# Patient Record
Sex: Female | Born: 1955 | Race: White | Hispanic: No | Marital: Married | Smoking: Former smoker
Health system: Southern US, Community
[De-identification: ages and names within clinical notes are randomized; demographics above are authoritative.]

## PROBLEM LIST (undated history)

## (undated) DIAGNOSIS — F329 Major depressive disorder, single episode, unspecified: Secondary | ICD-10-CM

## (undated) DIAGNOSIS — E039 Hypothyroidism, unspecified: Secondary | ICD-10-CM

## (undated) DIAGNOSIS — C801 Malignant (primary) neoplasm, unspecified: Secondary | ICD-10-CM

## (undated) DIAGNOSIS — E559 Vitamin D deficiency, unspecified: Secondary | ICD-10-CM

## (undated) DIAGNOSIS — E1165 Type 2 diabetes mellitus with hyperglycemia: Secondary | ICD-10-CM

## (undated) DIAGNOSIS — K219 Gastro-esophageal reflux disease without esophagitis: Secondary | ICD-10-CM

## (undated) DIAGNOSIS — F32A Depression, unspecified: Secondary | ICD-10-CM

## (undated) DIAGNOSIS — M1990 Unspecified osteoarthritis, unspecified site: Secondary | ICD-10-CM

## (undated) DIAGNOSIS — E119 Type 2 diabetes mellitus without complications: Secondary | ICD-10-CM

## (undated) DIAGNOSIS — F419 Anxiety disorder, unspecified: Secondary | ICD-10-CM

## (undated) DIAGNOSIS — G4730 Sleep apnea, unspecified: Secondary | ICD-10-CM

## (undated) DIAGNOSIS — E785 Hyperlipidemia, unspecified: Secondary | ICD-10-CM

## (undated) DIAGNOSIS — G2581 Restless legs syndrome: Secondary | ICD-10-CM

## (undated) DIAGNOSIS — D649 Anemia, unspecified: Secondary | ICD-10-CM

## (undated) DIAGNOSIS — B029 Zoster without complications: Secondary | ICD-10-CM

## (undated) HISTORY — PX: CLOSED REDUCTION ANKLE FRACTURE: SUR210

## (undated) HISTORY — PX: BACK SURGERY: SHX140

## (undated) HISTORY — PX: SKIN CANCER EXCISION: SHX779

---
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
## 2008-06-14 HISTORY — PX: BREAST BIOPSY: SHX20

## 2008-06-14 HISTORY — PX: KNEE ARTHROSCOPY: SUR90

## 2009-06-14 HISTORY — PX: CHOLECYSTECTOMY: SHX55

## 2012-06-14 HISTORY — PX: CLOSED REDUCTION HUMERUS FRACTURE: SHX985

## 2014-06-14 HISTORY — PX: EYE SURGERY: SHX253

## 2015-10-22 ENCOUNTER — Other Ambulatory Visit (HOSPITAL_COMMUNITY): Payer: Self-pay | Admitting: Internal Medicine

## 2015-10-22 DIAGNOSIS — Z1231 Encounter for screening mammogram for malignant neoplasm of breast: Secondary | ICD-10-CM

## 2015-11-11 ENCOUNTER — Ambulatory Visit (HOSPITAL_COMMUNITY): Payer: Self-pay

## 2015-11-12 ENCOUNTER — Ambulatory Visit (HOSPITAL_COMMUNITY): Payer: Self-pay

## 2015-11-12 ENCOUNTER — Other Ambulatory Visit (HOSPITAL_COMMUNITY): Payer: Self-pay | Admitting: Internal Medicine

## 2015-11-12 ENCOUNTER — Ambulatory Visit (HOSPITAL_COMMUNITY)
Admission: RE | Admit: 2015-11-12 | Discharge: 2015-11-12 | Disposition: A | Discharge status: Home / Self Care | Payer: Managed Care, Other (non HMO) | Source: Ambulatory Visit | Attending: Internal Medicine | Admitting: Internal Medicine

## 2015-11-12 DIAGNOSIS — Z1231 Encounter for screening mammogram for malignant neoplasm of breast: Secondary | ICD-10-CM | POA: Diagnosis not present

## 2015-12-30 ENCOUNTER — Other Ambulatory Visit (HOSPITAL_COMMUNITY): Payer: Self-pay | Admitting: Internal Medicine

## 2015-12-30 DIAGNOSIS — M545 Low back pain, unspecified: Secondary | ICD-10-CM

## 2016-01-06 ENCOUNTER — Ambulatory Visit (HOSPITAL_COMMUNITY): Payer: Managed Care, Other (non HMO)

## 2016-11-09 ENCOUNTER — Other Ambulatory Visit (HOSPITAL_COMMUNITY): Payer: Self-pay | Admitting: Internal Medicine

## 2016-11-09 DIAGNOSIS — Z1231 Encounter for screening mammogram for malignant neoplasm of breast: Secondary | ICD-10-CM

## 2016-11-17 ENCOUNTER — Ambulatory Visit (HOSPITAL_COMMUNITY)
Admission: RE | Admit: 2016-11-17 | Discharge: 2016-11-17 | Disposition: A | Discharge status: Home / Self Care | Payer: Managed Care, Other (non HMO) | Source: Ambulatory Visit | Attending: Internal Medicine | Admitting: Internal Medicine

## 2016-11-17 DIAGNOSIS — Z1231 Encounter for screening mammogram for malignant neoplasm of breast: Secondary | ICD-10-CM | POA: Insufficient documentation

## 2017-01-21 ENCOUNTER — Encounter (HOSPITAL_COMMUNITY): Payer: Self-pay

## 2017-01-21 ENCOUNTER — Other Ambulatory Visit (HOSPITAL_COMMUNITY): Payer: Self-pay | Admitting: Emergency Medicine

## 2017-01-21 NOTE — Patient Instructions (Signed)
Megan Church  01/21/2017   Your procedure is scheduled on: 01-31-17  Report to [facility] Main  Entrance   Report to admitting at [facility]   Call this number if you have problems the morning of surgery [phone]   Remember: ONLY 1 PERSON MAY GO WITH YOU TO SHORT STAY TO GET  READY MORNING OF YOUR SURGERY.  Do not eat food or drink liquids :After Midnight.     Take these medicines the morning of surgery with A SIP OF WATER: levothyroxine, omeprazole(prilosec)  DO NOT TAKE ANY DIABETIC MEDICATIONS DAY OF YOUR SURGERY                               You may not have any metal on your body including hair pins and              piercings  Do not wear jewelry, make-up, lotions, powders or perfumes, deodorant             Do not wear nail polish.  Do not shave  48 hours prior to surgery.               Do not bring valuables to the hospital. [place].  Contacts, dentures or bridgework may not be worn into surgery.  Leave suitcase in the car. After surgery it may be brought to your room.               Please read over the following fact sheets you were given: _____________________________________________________________________ How to Manage Your Diabetes Before and After Surgery  Why is it important to control my blood sugar before and after surgery? . Improving blood sugar levels before and after surgery helps healing and can limit problems. . A way of improving blood sugar control is eating a healthy diet by: o  Eating less sugar and carbohydrates o  Increasing activity/exercise o  Talking with your doctor about reaching your blood sugar goals . High blood sugars (greater than 180 mg/dL) can raise your risk of infections and slow your recovery, so you will need to focus on controlling your diabetes during the weeks before surgery. . Make sure that the doctor who takes care of your diabetes knows about your  planned surgery including the date and location.  How do I manage my blood sugar before surgery? . Check your blood sugar at least 4 times a day, starting 2 days before surgery, to make sure that the level is not too high or low. o Check your blood sugar the morning of your surgery when you wake up and every 2 hours until you get to the Short Stay unit. . If your blood sugar is less than 70 mg/dL, you will need to treat for low blood sugar: o Do not take insulin. o Treat a low blood sugar (less than 70 mg/dL) with  cup of clear juice (cranberry or apple), 4 glucose tablets, OR glucose gel. o Recheck blood sugar in 15 minutes after treatment (to make sure it is greater than 70 mg/dL). If your blood sugar is not greater than 70 mg/dL on recheck, call [phone] for further instructions. . Report your blood sugar to the short stay nurse when you get to Short Stay.  Marland Kitchen  If you are admitted to the hospital after surgery: o Your blood sugar will be checked by the staff and you will probably be given insulin after surgery (instead of oral diabetes medicines) to make sure you have good blood sugar levels. o The goal for blood sugar control after surgery is 80-180 mg/dL.   WHAT DO I DO ABOUT MY DIABETES MEDICATION?   . THE DAY BEFORE SURGERY,   take  30 units   units of   LEVEMIR    Insulin at bedtime   Take Metformin as usual    Take VICTOZA as usual      . THE MORNING OF SURGERY,   DO NOT TAKE METFORMIN  DO NOT TAKE VICTOZA   Patient Signature:  Date:   Nurse Signature:  Date:   Reviewed and Endorsed by [place] Patient Education Committee, August 2015            [facility] - Preparing for Surgery Before surgery, you can play an important role.  Because skin is not sterile, your skin needs to be as free of germs as possible.  You can reduce the number of germs on your skin by washing with CHG (chlorahexidine gluconate) soap before surgery.  CHG is an antiseptic cleaner which  kills germs and bonds with the skin to continue killing germs even after washing. Please DO NOT use if you have an allergy to CHG or antibacterial soaps.  If your skin becomes reddened/irritated stop using the CHG and inform your nurse when you arrive at Short Stay. Do not shave (including legs and underarms) for at least 48 hours prior to the first CHG shower.  You may shave your face/neck. Please follow these instructions carefully:  1.  Shower with CHG Soap the night before surgery and the  morning of Surgery.  2.  If you choose to wash your hair, wash your hair first as usual with your  normal  shampoo.  3.  After you shampoo, rinse your hair and body thoroughly to remove the  shampoo.                           4.  Use CHG as you would any other liquid soap.  You can apply chg directly  to the skin and wash                       Gently with a scrungie or clean washcloth.  5.  Apply the CHG Soap to your body ONLY FROM THE NECK DOWN.   Do not use on face/ open                           Wound or open sores. Avoid contact with eyes, ears mouth and genitals (private parts).                       Wash face,  Genitals (private parts) with your normal soap.             6.  Wash thoroughly, paying special attention to the area where your surgery  will be performed.  7.  Thoroughly rinse your body with warm water from the neck down.  8.  DO NOT shower/wash with your normal soap after using and rinsing off  the CHG Soap.  9.  Pat yourself dry with a clean towel.            10.  Wear clean pajamas.            11.  Place clean sheets on your bed the night of your first shower and do not  sleep with pets. Day of Surgery : Do not apply any lotions/deodorants the morning of surgery.  Please wear clean clothes to the hospital/surgery center.  FAILURE TO FOLLOW THESE INSTRUCTIONS MAY RESULT IN THE CANCELLATION OF YOUR SURGERY PATIENT SIGNATURE_________________________________  NURSE  SIGNATURE__________________________________  ________________________________________________________________________   Adam Phenix  An incentive spirometer is a tool that can help keep your lungs clear and active. This tool measures how well you are filling your lungs with each breath. Taking long deep breaths may help reverse or decrease the chance of developing breathing (pulmonary) problems (especially infection) following:  A long period of time when you are unable to move or be active. BEFORE THE PROCEDURE   If the spirometer includes an indicator to show your best effort, your nurse or respiratory therapist will set it to a desired goal.  If possible, sit up straight or lean slightly forward. Try not to slouch.  Hold the incentive spirometer in an upright position. INSTRUCTIONS FOR USE  1. Sit on the edge of your bed if possible, or sit up as far as you can in bed or on a chair. 2. Hold the incentive spirometer in an upright position. 3. Breathe out normally. 4. Place the mouthpiece in your mouth and seal your lips tightly around it. 5. Breathe in slowly and as deeply as possible, raising the piston or the ball toward the top of the column. 6. Hold your breath for 3-5 seconds or for as long as possible. Allow the piston or ball to fall to the bottom of the column. 7. Remove the mouthpiece from your mouth and breathe out normally. 8. Rest for a few seconds and repeat Steps 1 through 7 at least 10 times every 1-2 hours when you are awake. Take your time and take a few normal breaths between deep breaths. 9. The spirometer may include an indicator to show your best effort. Use the indicator as a goal to work toward during each repetition. 10. After each set of 10 deep breaths, practice coughing to be sure your lungs are clear. If you have an incision (the cut made at the time of surgery), support your incision when coughing by placing a pillow or rolled up towels firmly  against it. Once you are able to get out of bed, walk around indoors and cough well. You may stop using the incentive spirometer when instructed by your caregiver.  RISKS AND COMPLICATIONS  Take your time so you do not get dizzy or light-headed.  If you are in pain, you may need to take or ask for pain medication before doing incentive spirometry. It is harder to take a deep breath if you are having pain. AFTER USE  Rest and breathe slowly and easily.  It can be helpful to keep track of a log of your progress. Your caregiver can provide you with a simple table to help with this. If you are using the spirometer at home, follow these instructions: [place] IF:   You are having difficultly using the spirometer.  You have trouble using the spirometer as often as instructed.  Your pain medication is not giving enough relief while using the spirometer.  You  develop fever of 100.5 F (38.1 C) or higher. SEEK IMMEDIATE MEDICAL CARE IF:   You cough up bloody sputum that had not been present before.  You develop fever of 102 F (38.9 C) or greater.  You develop worsening pain at or near the incision site. MAKE SURE YOU:   Understand these instructions.  Will watch your condition.  Will get help right away if you are not doing well or get worse. Document Released: 10/11/2006 Document Revised: 08/23/2011 Document Reviewed: 12/12/2006 Exit[employer] Patient Information 2014 Exit[employer], [place].   ________________________________________________________________________  WHAT IS A BLOOD TRANSFUSION? Blood Transfusion Information  A transfusion is the replacement of blood or some of its parts. Blood is made up of multiple cells which provide different functions.  Red blood cells carry oxygen and are used for blood loss replacement.  White blood cells fight against infection.  Platelets control bleeding.  Plasma helps clot blood.  Other blood products are available for  specialized needs, such as hemophilia or other clotting disorders. BEFORE THE TRANSFUSION  Who gives blood for transfusions?   Healthy volunteers who are fully evaluated to make sure their blood is safe. This is blood bank blood. Transfusion therapy is the safest it has ever been in the practice of medicine. Before blood is taken from a donor, a complete history is taken to make sure that person has no history of diseases nor engages in risky social behavior (examples are intravenous drug use or sexual activity with multiple partners). The donor's travel history is screened to minimize risk of transmitting infections, such as malaria. The donated blood is tested for signs of infectious diseases, such as HIV and hepatitis. The blood is then tested to be sure it is compatible with you in order to minimize the chance of a transfusion reaction. If you or a relative donates blood, this is often done in anticipation of surgery and is not appropriate for emergency situations. It takes many days to process the donated blood. RISKS AND COMPLICATIONS Although transfusion therapy is very safe and saves many lives, the main dangers of transfusion include:   Getting an infectious disease.  Developing a transfusion reaction. This is an allergic reaction to something in the blood you were given. Every precaution is taken to prevent this. The decision to have a blood transfusion has been considered carefully by your caregiver before blood is given. Blood is not given unless the benefits outweigh the risks. AFTER THE TRANSFUSION  Right after receiving a blood transfusion, you will usually feel much better and more energetic. This is especially true if your red blood cells have gotten low (anemic). The transfusion raises the level of the red blood cells which carry oxygen, and this usually causes an energy increase.  The nurse administering the transfusion will monitor you carefully for complications. HOME CARE  INSTRUCTIONS  No special instructions are needed after a transfusion. You may find your energy is better. Speak with your caregiver about any limitations on activity for underlying diseases you may have. SEEK MEDICAL CARE IF:   Your condition is not improving after your transfusion.  You develop redness or irritation at the intravenous (IV) site. SEEK IMMEDIATE MEDICAL CARE IF:  Any of the following symptoms occur over the next 12 hours:  Shaking chills.  You have a temperature by mouth above 102 F (38.9 C), not controlled by medicine.  Chest, back, or muscle pain.  People around you feel you are not acting correctly or are confused.  Shortness of  breath or difficulty breathing.  Dizziness and fainting.  You get a rash or develop hives.  You have a decrease in urine output.  Your urine turns a dark color or changes to pink, red, or brown. Any of the following symptoms occur over the next 10 days:  You have a temperature by mouth above 102 F (38.9 C), not controlled by medicine.  Shortness of breath.  Weakness after normal activity.  The white part of the eye turns yellow (jaundice).  You have a decrease in the amount of urine or are urinating less often.  Your urine turns a dark color or changes to pink, red, or brown. Document Released: 05/28/2000 Document Revised: 08/23/2011 Document Reviewed: 01/15/2008 [facility] Patient Information 2014 [place], [place].  _______________________________________________________________________

## 2017-01-21 NOTE — Progress Notes (Signed)
LOV/ surgical clearance Dr Anastasio Champion 12-27-16 on chart   EKG 08-2016 on chart   HgA1c 12-27-16 on chart

## 2017-01-24 ENCOUNTER — Encounter (HOSPITAL_COMMUNITY): Payer: Self-pay

## 2017-01-24 ENCOUNTER — Encounter (HOSPITAL_COMMUNITY)
Admission: RE | Admit: 2017-01-24 | Discharge: 2017-01-24 | Disposition: A | Discharge status: Home / Self Care | Payer: Managed Care, Other (non HMO) | Source: Ambulatory Visit | Attending: Orthopedic Surgery | Admitting: Orthopedic Surgery

## 2017-01-24 DIAGNOSIS — Z01812 Encounter for preprocedural laboratory examination: Secondary | ICD-10-CM | POA: Diagnosis not present

## 2017-01-24 DIAGNOSIS — M1711 Unilateral primary osteoarthritis, right knee: Secondary | ICD-10-CM | POA: Diagnosis not present

## 2017-01-24 HISTORY — DX: Depression, unspecified: F32.A

## 2017-01-24 HISTORY — DX: Hyperlipidemia, unspecified: E78.5

## 2017-01-24 HISTORY — DX: Gastro-esophageal reflux disease without esophagitis: K21.9

## 2017-01-24 HISTORY — DX: Restless legs syndrome: G25.81

## 2017-01-24 HISTORY — DX: Hypothyroidism, unspecified: E03.9

## 2017-01-24 HISTORY — DX: Type 2 diabetes mellitus without complications: E11.9

## 2017-01-24 HISTORY — DX: Major depressive disorder, single episode, unspecified: F32.9

## 2017-01-24 HISTORY — DX: Zoster without complications: B02.9

## 2017-01-24 HISTORY — DX: Unspecified osteoarthritis, unspecified site: M19.90

## 2017-01-24 LAB — CBC
HCT: 33.5 % — ABNORMAL LOW (ref 36.0–46.0)
Hemoglobin: 11.4 g/dL — ABNORMAL LOW (ref 12.0–15.0)
MCH: 31.6 pg (ref 26.0–34.0)
MCHC: 34 g/dL (ref 30.0–36.0)
MCV: 92.8 fL (ref 78.0–100.0)
Platelets: 279 10*3/uL (ref 150–400)
RBC: 3.61 MIL/uL — ABNORMAL LOW (ref 3.87–5.11)
RDW: 15.5 % (ref 11.5–15.5)
WBC: 7.3 10*3/uL (ref 4.0–10.5)

## 2017-01-24 LAB — BASIC METABOLIC PANEL
Anion gap: 10 (ref 5–15)
BUN: 11 mg/dL (ref 6–20)
CO2: 27 mmol/L (ref 22–32)
Calcium: 9.5 mg/dL (ref 8.9–10.3)
Chloride: 104 mmol/L (ref 101–111)
Creatinine, Ser: 0.64 mg/dL (ref 0.44–1.00)
GFR calc Af Amer: >60 mL/min (ref >60)
GFR calc non Af Amer: >60 mL/min (ref >60)
Glucose, Bld: 158 mg/dL — ABNORMAL HIGH (ref 65–99)
Potassium: 3.6 mmol/L (ref 3.5–5.1)
Sodium: 141 mmol/L (ref 135–145)

## 2017-01-24 LAB — GLUCOSE, CAPILLARY: Glucose-Capillary: 183 mg/dL — ABNORMAL HIGH (ref 65–99)

## 2017-01-24 LAB — SURGICAL PCR SCREEN
MRSA, PCR: NEGATIVE
Staphylococcus aureus: NEGATIVE

## 2017-01-24 LAB — ABO/RH: ABO/RH(D): O POS

## 2017-01-25 LAB — HEMOGLOBIN A1C
Hgb A1c MFr Bld: 7.9 % — ABNORMAL HIGH (ref 4.8–5.6)
Mean Plasma Glucose: 180 mg/dL

## 2017-01-26 NOTE — H&P (Signed)
TOTAL KNEE ADMISSION H&P  Patient is being admitted for right total knee arthroplasty.  Subjective:  Chief Complaint:   Right knee primary OA / pain  HPI: Megan Church, 61 y.o. female, has a history of pain and functional disability in the right knee due to arthritis and has failed non-surgical conservative treatments for greater than 12 weeks to include NSAID's and/or analgesics, corticosteriod injections, viscosupplementation injections and activity modification.  Onset of symptoms was gradual, starting 3+ years ago with gradually worsening course since that time. The patient noted prior procedures on the knee to include  arthroscopy and menisectomy on the right knee(s).  Patient currently rates pain in the right knee(s) at 10 out of 10 with activity. Patient has night pain, worsening of pain with activity and weight bearing, pain that interferes with activities of daily living, pain with passive range of motion, crepitus and joint swelling.  Patient has evidence of periarticular osteophytes and joint space narrowing by imaging studies. There is no active infection.  Risks, benefits and expectations were discussed with the patient.  Risks including but not limited to the risk of anesthesia, blood clots, nerve damage, blood vessel damage, failure of the prosthesis, infection and up to and including death.  Patient understand the risks, benefits and expectations and wishes to proceed with surgery.   PCP: Megan Albee, MD  D/C Plans:       Home  Post-op Meds:       No Rx given  Tranexamic Acid:      To be given - IV    Decadron:      Is to be given  FYI:     ASA  Norco  CPAP  DME:   Pt already has equipment   PT:   Rx given for OPPT   Past Medical History:  Diagnosis Date  . Arthritis   . Depression   . Diabetes mellitus without complication ([place])    2  . GERD (gastroesophageal reflux disease)   . HLD (hyperlipidemia)   . Hypothyroidism   . RLS (restless legs syndrome)    . Shingles     Past Surgical History:  Procedure Laterality Date  . BACK SURGERY  2012, 2016   lumbar fusion 2012, cleaning out of area 2016  . BREAST BIOPSY Left 2010  . CHOLECYSTECTOMY  2011  . CLOSED REDUCTION ANKLE FRACTURE    . CLOSED REDUCTION HUMERUS FRACTURE Right 2014  . EYE SURGERY  2016   eye lid droop   . KNEE ARTHROSCOPY Right 2010    No prescriptions prior to admission.   Allergies  Allergen Reactions  . Diclofenac Sodium Rash  . Sulfa Antibiotics Hives  . Ace Inhibitors Cough  . Invokana [Canagliflozin] Other (See Comments)    Yeast   . Vytorin [Ezetimibe-Simvastatin] Other (See Comments)    Leg cramps  . Atorvastatin Hives    Social History  Substance Use Topics  . Smoking status: Former Research scientist (life sciences)  . Smokeless tobacco: Never Used     Comment: quit age 41  . Alcohol use 4.2 oz/week    7 Shots of liquor per week     Comment: mixed drink 1 a day        Review of Systems  Constitutional: Negative.   HENT: Negative.   Eyes: Negative.   Respiratory: Positive for shortness of breath (on exertion).   Cardiovascular: Negative.   Gastrointestinal: Positive for constipation, diarrhea and heartburn.  Genitourinary: Negative.   Musculoskeletal: Positive for back pain  and joint pain.  Skin: Negative.   Neurological: Negative.   Endo/Heme/Allergies: Negative.   Psychiatric/Behavioral: The patient has insomnia.     Objective:  Physical Exam  Constitutional: She is oriented to person, place, and time. She appears well-developed.  HENT:  Head: Normocephalic.  Eyes: Pupils are equal, round, and reactive to light.  Neck: Neck supple. No JVD present. No tracheal deviation present. No thyromegaly present.  Cardiovascular: Normal rate, regular rhythm and intact distal pulses.   Respiratory: Effort normal and breath sounds normal. No respiratory distress. She has no wheezes.  GI: Soft. There is no tenderness. There is no guarding.  Musculoskeletal:       Right  knee: She exhibits decreased range of motion, swelling and bony tenderness. She exhibits no ecchymosis, no deformity, no laceration and no erythema. Tenderness found.  Lymphadenopathy:    She has no cervical adenopathy.  Neurological: She is alert and oriented to person, place, and time.  Skin: Skin is warm and dry.  Psychiatric: She has a normal mood and affect.      Labs:  Estimated body mass index is 36.6 kg/m as calculated from the following:   Height as of 01/24/17: 5\' 4"  (1.626 m).   Weight as of 01/24/17: 96.7 kg (213 lb 3.2 oz).   Imaging Review Plain radiographs demonstrate severe degenerative joint disease of the right knee(s). The overall alignment is neutral. The bone quality appears to be good for age and reported activity level.  Assessment/Plan:  End stage arthritis, right knee   The patient history, physical examination, clinical judgment of the provider and imaging studies are consistent with end stage degenerative joint disease of the right knee(s) and total knee arthroplasty is deemed medically necessary. The treatment options including medical management, injection therapy arthroscopy and arthroplasty were discussed at length. The risks and benefits of total knee arthroplasty were presented and reviewed. The risks due to aseptic loosening, infection, stiffness, patella tracking problems, thromboembolic complications and other imponderables were discussed. The patient acknowledged the explanation, agreed to proceed with the plan and consent was signed. Patient is being admitted for inpatient treatment for surgery, pain control, PT, OT, prophylactic antibiotics, VTE prophylaxis, progressive ambulation and ADL's and discharge planning. The patient is planning to be discharged home.    Megan Church Megan Yabut   PA-C  01/26/2017, 10:57 AM

## 2017-01-31 ENCOUNTER — Observation Stay (HOSPITAL_COMMUNITY)
Admission: RE | Admit: 2017-01-31 | Discharge: 2017-02-01 | Disposition: A | Discharge status: Home / Self Care | Payer: Managed Care, Other (non HMO) | Source: Ambulatory Visit | Attending: Orthopedic Surgery | Admitting: Orthopedic Surgery

## 2017-01-31 ENCOUNTER — Inpatient Hospital Stay (HOSPITAL_COMMUNITY): Payer: Managed Care, Other (non HMO) | Admitting: Anesthesiology

## 2017-01-31 ENCOUNTER — Encounter (HOSPITAL_COMMUNITY): Payer: Self-pay

## 2017-01-31 ENCOUNTER — Encounter (HOSPITAL_COMMUNITY)
Admission: RE | Disposition: A | Discharge status: Home / Self Care | Payer: Self-pay | Source: Ambulatory Visit | Attending: Orthopedic Surgery

## 2017-01-31 DIAGNOSIS — F329 Major depressive disorder, single episode, unspecified: Secondary | ICD-10-CM | POA: Diagnosis not present

## 2017-01-31 DIAGNOSIS — E785 Hyperlipidemia, unspecified: Secondary | ICD-10-CM | POA: Insufficient documentation

## 2017-01-31 DIAGNOSIS — Z6836 Body mass index (BMI) 36.0-36.9, adult: Secondary | ICD-10-CM | POA: Diagnosis not present

## 2017-01-31 DIAGNOSIS — E119 Type 2 diabetes mellitus without complications: Secondary | ICD-10-CM | POA: Diagnosis not present

## 2017-01-31 DIAGNOSIS — M25761 Osteophyte, right knee: Secondary | ICD-10-CM | POA: Insufficient documentation

## 2017-01-31 DIAGNOSIS — M1711 Unilateral primary osteoarthritis, right knee: Secondary | ICD-10-CM | POA: Diagnosis not present

## 2017-01-31 DIAGNOSIS — Z794 Long term (current) use of insulin: Secondary | ICD-10-CM | POA: Diagnosis not present

## 2017-01-31 DIAGNOSIS — M659 Synovitis and tenosynovitis, unspecified: Secondary | ICD-10-CM | POA: Diagnosis not present

## 2017-01-31 DIAGNOSIS — Z87891 Personal history of nicotine dependence: Secondary | ICD-10-CM | POA: Insufficient documentation

## 2017-01-31 DIAGNOSIS — G2581 Restless legs syndrome: Secondary | ICD-10-CM | POA: Insufficient documentation

## 2017-01-31 DIAGNOSIS — Z981 Arthrodesis status: Secondary | ICD-10-CM | POA: Insufficient documentation

## 2017-01-31 DIAGNOSIS — E039 Hypothyroidism, unspecified: Secondary | ICD-10-CM | POA: Diagnosis not present

## 2017-01-31 DIAGNOSIS — Z79899 Other long term (current) drug therapy: Secondary | ICD-10-CM | POA: Insufficient documentation

## 2017-01-31 DIAGNOSIS — K219 Gastro-esophageal reflux disease without esophagitis: Secondary | ICD-10-CM | POA: Insufficient documentation

## 2017-01-31 DIAGNOSIS — Z96659 Presence of unspecified artificial knee joint: Secondary | ICD-10-CM

## 2017-01-31 DIAGNOSIS — M25461 Effusion, right knee: Secondary | ICD-10-CM | POA: Diagnosis not present

## 2017-01-31 HISTORY — PX: TOTAL KNEE ARTHROPLASTY: SHX125

## 2017-01-31 LAB — GLUCOSE, CAPILLARY
Glucose-Capillary: 159 mg/dL — ABNORMAL HIGH (ref 65–99)
Glucose-Capillary: 195 mg/dL — ABNORMAL HIGH (ref 65–99)
Glucose-Capillary: 286 mg/dL — ABNORMAL HIGH (ref 65–99)
Glucose-Capillary: 315 mg/dL — ABNORMAL HIGH (ref 65–99)
Glucose-Capillary: 373 mg/dL — ABNORMAL HIGH (ref 65–99)

## 2017-01-31 LAB — TYPE AND SCREEN
ABO/RH(D): O POS
Antibody Screen: NEGATIVE

## 2017-01-31 SURGERY — ARTHROPLASTY, KNEE, TOTAL
Anesthesia: Monitor Anesthesia Care | Site: Knee | Laterality: Right

## 2017-01-31 MED ORDER — DIPHENHYDRAMINE HCL 25 MG PO CAPS
25.0000 mg | ORAL_CAPSULE | Freq: Four times a day (QID) | ORAL | Status: DC | PRN
  Administered 2017-02-01: 25 mg via ORAL
  Filled 2017-01-31: qty 1

## 2017-01-31 MED ORDER — DOCUSATE SODIUM 100 MG PO CAPS
100.0000 mg | ORAL_CAPSULE | Freq: Two times a day (BID) | ORAL | Status: DC
  Administered 2017-01-31 – 2017-02-01 (×2): 100 mg via ORAL
  Filled 2017-01-31 (×2): qty 1

## 2017-01-31 MED ORDER — FUROSEMIDE 40 MG PO TABS
40.0000 mg | ORAL_TABLET | ORAL | Status: DC
Start: 2017-02-01 — End: 2017-02-01
  Filled 2017-01-31: qty 1

## 2017-01-31 MED ORDER — MAGNESIUM CITRATE PO SOLN: 1.0000 | Freq: Once | ORAL | Status: DC | PRN

## 2017-01-31 MED ORDER — INSULIN DETEMIR 100 UNIT/ML ~~LOC~~ SOLN
60.0000 [IU] | Freq: Every day | SUBCUTANEOUS | Status: DC
  Administered 2017-01-31: 60 [IU] via SUBCUTANEOUS
  Filled 2017-01-31 (×3): qty 0.6

## 2017-01-31 MED ORDER — DEXAMETHASONE SODIUM PHOSPHATE 10 MG/ML IJ SOLN
10.0000 mg | Freq: Once | INTRAMUSCULAR | Status: AC
  Administered 2017-02-01: 10 mg via INTRAVENOUS
  Filled 2017-01-31: qty 1

## 2017-01-31 MED ORDER — MIDAZOLAM HCL 2 MG/2ML IJ SOLN
INTRAMUSCULAR | Status: AC
  Administered 2017-01-31: 2 mg
  Filled 2017-01-31: qty 2

## 2017-01-31 MED ORDER — BISACODYL 10 MG RE SUPP: 10.0000 mg | Freq: Every day | RECTAL | Status: DC | PRN

## 2017-01-31 MED ORDER — METOCLOPRAMIDE HCL 5 MG PO TABS: 5.0000 mg | ORAL_TABLET | Freq: Three times a day (TID) | ORAL | Status: DC | PRN

## 2017-01-31 MED ORDER — CEFAZOLIN SODIUM-DEXTROSE 2-4 GM/100ML-% IV SOLN
INTRAVENOUS | Status: AC
  Filled 2017-01-31: qty 100

## 2017-01-31 MED ORDER — GABAPENTIN 300 MG PO CAPS: 300.0000 mg | ORAL_CAPSULE | Freq: Once | ORAL | Status: DC | PRN

## 2017-01-31 MED ORDER — BUPIVACAINE-EPINEPHRINE (PF) 0.25% -1:200000 IJ SOLN
INTRAMUSCULAR | Status: DC | PRN
  Administered 2017-01-31: 30 mL

## 2017-01-31 MED ORDER — ONDANSETRON HCL 4 MG/2ML IJ SOLN
INTRAMUSCULAR | Status: AC
  Filled 2017-01-31: qty 2

## 2017-01-31 MED ORDER — GABAPENTIN 300 MG PO CAPS
600.0000 mg | ORAL_CAPSULE | Freq: Every day | ORAL | Status: DC
  Administered 2017-01-31: 600 mg via ORAL
  Filled 2017-01-31: qty 2

## 2017-01-31 MED ORDER — METHOCARBAMOL 500 MG PO TABS: 500.0000 mg | ORAL_TABLET | Freq: Four times a day (QID) | ORAL | 0 refills | Status: DC | PRN

## 2017-01-31 MED ORDER — DEXAMETHASONE SODIUM PHOSPHATE 10 MG/ML IJ SOLN
INTRAMUSCULAR | Status: AC
  Filled 2017-01-31: qty 1

## 2017-01-31 MED ORDER — INSULIN ASPART 100 UNIT/ML ~~LOC~~ SOLN
0.0000 [IU] | Freq: Three times a day (TID) | SUBCUTANEOUS | Status: DC
  Administered 2017-01-31: 18:00:00 11 [IU] via SUBCUTANEOUS
  Administered 2017-02-01 (×2): 3 [IU] via SUBCUTANEOUS

## 2017-01-31 MED ORDER — CEFAZOLIN SODIUM-DEXTROSE 2-4 GM/100ML-% IV SOLN
2.0000 g | Freq: Four times a day (QID) | INTRAVENOUS | Status: AC
  Administered 2017-01-31 – 2017-02-01 (×2): 2 g via INTRAVENOUS
  Filled 2017-01-31 (×2): qty 100

## 2017-01-31 MED ORDER — POLYETHYLENE GLYCOL 3350 17 G PO PACK: 17.0000 g | PACK | Freq: Two times a day (BID) | ORAL | 0 refills | Status: DC

## 2017-01-31 MED ORDER — SODIUM CHLORIDE 0.9 % IJ SOLN
INTRAMUSCULAR | Status: AC
  Filled 2017-01-31: qty 50

## 2017-01-31 MED ORDER — FENTANYL CITRATE (PF) 100 MCG/2ML IJ SOLN
50.0000 ug | INTRAMUSCULAR | Status: DC | PRN
  Administered 2017-01-31: 100 ug via INTRAVENOUS

## 2017-01-31 MED ORDER — METHOCARBAMOL 1000 MG/10ML IJ SOLN
500.0000 mg | Freq: Four times a day (QID) | INTRAVENOUS | Status: DC | PRN
  Filled 2017-01-31: qty 5

## 2017-01-31 MED ORDER — ROSUVASTATIN CALCIUM 20 MG PO TABS
20.0000 mg | ORAL_TABLET | Freq: Every day | ORAL | Status: DC
  Administered 2017-01-31: 20 mg via ORAL
  Filled 2017-01-31: qty 1

## 2017-01-31 MED ORDER — LEVOTHYROXINE SODIUM 100 MCG PO TABS
100.0000 ug | ORAL_TABLET | Freq: Every day | ORAL | Status: DC
  Administered 2017-02-01: 06:00:00 100 ug via ORAL
  Filled 2017-01-31: qty 1

## 2017-01-31 MED ORDER — SODIUM CHLORIDE 0.9 % IV SOLN
1000.0000 mg | INTRAVENOUS | Status: AC
Start: 2017-01-31 — End: 2017-01-31
  Administered 2017-01-31: 1000 mg via INTRAVENOUS
  Filled 2017-01-31: qty 1100

## 2017-01-31 MED ORDER — METOCLOPRAMIDE HCL 5 MG/ML IJ SOLN: 5.0000 mg | Freq: Three times a day (TID) | INTRAMUSCULAR | Status: DC | PRN

## 2017-01-31 MED ORDER — HYDROCODONE-ACETAMINOPHEN 7.5-325 MG PO TABS: 1.0000 | ORAL_TABLET | ORAL | 0 refills | Status: DC | PRN

## 2017-01-31 MED ORDER — FENTANYL CITRATE (PF) 100 MCG/2ML IJ SOLN
INTRAMUSCULAR | Status: DC | PRN
  Administered 2017-01-31: 50 ug via INTRAVENOUS

## 2017-01-31 MED ORDER — BUPIVACAINE IN DEXTROSE 0.75-8.25 % IT SOLN
INTRATHECAL | Status: DC | PRN
  Administered 2017-01-31: 2 mL via INTRATHECAL

## 2017-01-31 MED ORDER — MIDAZOLAM HCL 5 MG/5ML IJ SOLN
INTRAMUSCULAR | Status: DC | PRN
  Administered 2017-01-31: 2 mg via INTRAVENOUS

## 2017-01-31 MED ORDER — KETOROLAC TROMETHAMINE 30 MG/ML IJ SOLN
INTRAMUSCULAR | Status: AC
  Filled 2017-01-31: qty 1

## 2017-01-31 MED ORDER — PHENYLEPHRINE HCL 10 MG/ML IJ SOLN
INTRAMUSCULAR | Status: DC | PRN
  Administered 2017-01-31: 50 ug/min via INTRAVENOUS

## 2017-01-31 MED ORDER — ONDANSETRON HCL 4 MG PO TABS: 4.0000 mg | ORAL_TABLET | Freq: Four times a day (QID) | ORAL | Status: DC | PRN

## 2017-01-31 MED ORDER — PROPOFOL 10 MG/ML IV BOLUS
INTRAVENOUS | Status: DC | PRN
  Administered 2017-01-31: 30 mg via INTRAVENOUS

## 2017-01-31 MED ORDER — SODIUM CHLORIDE 0.9 % IJ SOLN
INTRAMUSCULAR | Status: DC | PRN
  Administered 2017-01-31: 30 mL

## 2017-01-31 MED ORDER — PANTOPRAZOLE SODIUM 40 MG PO TBEC
40.0000 mg | DELAYED_RELEASE_TABLET | Freq: Every day | ORAL | Status: DC
  Administered 2017-02-01: 40 mg via ORAL
  Filled 2017-01-31: qty 1

## 2017-01-31 MED ORDER — METFORMIN HCL 500 MG PO TABS
1000.0000 mg | ORAL_TABLET | Freq: Two times a day (BID) | ORAL | Status: DC
  Administered 2017-02-01: 1000 mg via ORAL
  Filled 2017-01-31: qty 2

## 2017-01-31 MED ORDER — PHENOL 1.4 % MT LIQD: 1.0000 | OROMUCOSAL | Status: DC | PRN

## 2017-01-31 MED ORDER — FENTANYL CITRATE (PF) 100 MCG/2ML IJ SOLN
25.0000 ug | INTRAMUSCULAR | Status: DC | PRN
  Administered 2017-01-31: 20:00:00 25 ug via INTRAVENOUS
  Filled 2017-01-31: qty 2

## 2017-01-31 MED ORDER — DEXAMETHASONE SODIUM PHOSPHATE 10 MG/ML IJ SOLN
10.0000 mg | Freq: Once | INTRAMUSCULAR | Status: AC
  Administered 2017-01-31: 10 mg via INTRAVENOUS

## 2017-01-31 MED ORDER — HYDROMORPHONE HCL-NACL 0.5-0.9 MG/ML-% IV SOSY: 0.2500 mg | PREFILLED_SYRINGE | INTRAVENOUS | Status: DC | PRN

## 2017-01-31 MED ORDER — DOCUSATE SODIUM 100 MG PO CAPS: 100.0000 mg | ORAL_CAPSULE | Freq: Two times a day (BID) | ORAL | 0 refills | Status: DC

## 2017-01-31 MED ORDER — TRANEXAMIC ACID 1000 MG/10ML IV SOLN
1000.0000 mg | Freq: Once | INTRAVENOUS | Status: AC
  Administered 2017-01-31: 1000 mg via INTRAVENOUS
  Filled 2017-01-31: qty 1100

## 2017-01-31 MED ORDER — MIDAZOLAM HCL 5 MG/ML IJ SOLN: 2.0000 mg | Freq: Once | INTRAMUSCULAR | Status: DC

## 2017-01-31 MED ORDER — PHENYLEPHRINE 40 MCG/ML (10ML) SYRINGE FOR IV PUSH (FOR BLOOD PRESSURE SUPPORT)
PREFILLED_SYRINGE | INTRAVENOUS | Status: AC
  Filled 2017-01-31: qty 10

## 2017-01-31 MED ORDER — FENTANYL CITRATE (PF) 100 MCG/2ML IJ SOLN
INTRAMUSCULAR | Status: AC
  Administered 2017-01-31: 100 ug via INTRAVENOUS
  Filled 2017-01-31: qty 2

## 2017-01-31 MED ORDER — POLYETHYLENE GLYCOL 3350 17 G PO PACK
17.0000 g | PACK | Freq: Two times a day (BID) | ORAL | Status: DC
  Administered 2017-02-01: 17 g via ORAL
  Filled 2017-01-31: qty 1

## 2017-01-31 MED ORDER — PROPOFOL 10 MG/ML IV BOLUS
INTRAVENOUS | Status: AC
  Filled 2017-01-31: qty 60

## 2017-01-31 MED ORDER — ONDANSETRON HCL 4 MG/2ML IJ SOLN
INTRAMUSCULAR | Status: DC | PRN
  Administered 2017-01-31: 4 mg via INTRAVENOUS

## 2017-01-31 MED ORDER — CEFAZOLIN SODIUM-DEXTROSE 2-4 GM/100ML-% IV SOLN
2.0000 g | INTRAVENOUS | Status: AC
  Administered 2017-01-31: 2 g via INTRAVENOUS

## 2017-01-31 MED ORDER — PROMETHAZINE HCL 25 MG/ML IJ SOLN: 6.2500 mg | INTRAMUSCULAR | Status: DC | PRN

## 2017-01-31 MED ORDER — PHENYLEPHRINE HCL 10 MG/ML IJ SOLN
INTRAMUSCULAR | Status: AC
  Filled 2017-01-31: qty 1

## 2017-01-31 MED ORDER — ROPINIROLE HCL 1 MG PO TABS
3.0000 mg | ORAL_TABLET | Freq: Every day | ORAL | Status: DC
  Administered 2017-01-31: 3 mg via ORAL
  Filled 2017-01-31: qty 6
  Filled 2017-01-31: qty 3

## 2017-01-31 MED ORDER — FERROUS SULFATE 325 (65 FE) MG PO TABS
325.0000 mg | ORAL_TABLET | Freq: Three times a day (TID) | ORAL | Status: DC
  Administered 2017-02-01 (×2): 325 mg via ORAL
  Filled 2017-01-31 (×2): qty 1

## 2017-01-31 MED ORDER — FENTANYL CITRATE (PF) 100 MCG/2ML IJ SOLN
INTRAMUSCULAR | Status: AC
  Filled 2017-01-31: qty 2

## 2017-01-31 MED ORDER — MIDAZOLAM HCL 2 MG/2ML IJ SOLN
INTRAMUSCULAR | Status: AC
  Filled 2017-01-31: qty 2

## 2017-01-31 MED ORDER — ALUM & MAG HYDROXIDE-SIMETH 200-200-20 MG/5ML PO SUSP: 15.0000 mL | ORAL | Status: DC | PRN

## 2017-01-31 MED ORDER — KETOROLAC TROMETHAMINE 30 MG/ML IJ SOLN
INTRAMUSCULAR | Status: DC | PRN
  Administered 2017-01-31: 30 mg

## 2017-01-31 MED ORDER — PROPOFOL 500 MG/50ML IV EMUL
INTRAVENOUS | Status: DC | PRN
  Administered 2017-01-31: 100 ug/kg/min via INTRAVENOUS

## 2017-01-31 MED ORDER — PHENYLEPHRINE 40 MCG/ML (10ML) SYRINGE FOR IV PUSH (FOR BLOOD PRESSURE SUPPORT)
PREFILLED_SYRINGE | INTRAVENOUS | Status: DC | PRN
  Administered 2017-01-31: 80 ug via INTRAVENOUS

## 2017-01-31 MED ORDER — FERROUS SULFATE 325 (65 FE) MG PO TABS
325.0000 mg | ORAL_TABLET | Freq: Three times a day (TID) | ORAL | Status: DC
Start: 2017-01-31 — End: 2019-02-20

## 2017-01-31 MED ORDER — ONDANSETRON HCL 4 MG/2ML IJ SOLN: 4.0000 mg | Freq: Four times a day (QID) | INTRAMUSCULAR | Status: DC | PRN

## 2017-01-31 MED ORDER — INSULIN ASPART 100 UNIT/ML ~~LOC~~ SOLN
0.0000 [IU] | Freq: Every day | SUBCUTANEOUS | Status: DC
  Administered 2017-02-01: 3 [IU] via SUBCUTANEOUS

## 2017-01-31 MED ORDER — CHLORHEXIDINE GLUCONATE 4 % EX LIQD: 60.0000 mL | Freq: Once | CUTANEOUS | Status: DC

## 2017-01-31 MED ORDER — FENOFIBRATE 160 MG PO TABS
160.0000 mg | ORAL_TABLET | Freq: Every day | ORAL | Status: DC
  Administered 2017-01-31: 160 mg via ORAL
  Filled 2017-01-31: qty 1

## 2017-01-31 MED ORDER — METHOCARBAMOL 500 MG PO TABS
500.0000 mg | ORAL_TABLET | Freq: Four times a day (QID) | ORAL | Status: DC | PRN
Start: 2017-01-31 — End: 2017-02-01
  Administered 2017-02-01: 500 mg via ORAL
  Filled 2017-01-31: qty 1

## 2017-01-31 MED ORDER — 0.9 % SODIUM CHLORIDE (POUR BTL) OPTIME
TOPICAL | Status: DC | PRN
  Administered 2017-01-31: 1000 mL

## 2017-01-31 MED ORDER — LACTATED RINGERS IV SOLN
INTRAVENOUS | Status: DC
  Administered 2017-01-31 (×2): via INTRAVENOUS

## 2017-01-31 MED ORDER — SODIUM CHLORIDE 0.9 % IR SOLN
Status: DC | PRN
  Administered 2017-01-31: 1000 mL

## 2017-01-31 MED ORDER — BUPIVACAINE-EPINEPHRINE (PF) 0.5% -1:200000 IJ SOLN
INTRAMUSCULAR | Status: DC | PRN
  Administered 2017-01-31: 30 mL via PERINEURAL

## 2017-01-31 MED ORDER — ESCITALOPRAM OXALATE 20 MG PO TABS
20.0000 mg | ORAL_TABLET | Freq: Every day | ORAL | Status: DC
  Administered 2017-01-31: 23:00:00 20 mg via ORAL
  Filled 2017-01-31: qty 1

## 2017-01-31 MED ORDER — HYDROCODONE-ACETAMINOPHEN 7.5-325 MG PO TABS
1.0000 | ORAL_TABLET | ORAL | Status: DC
  Administered 2017-01-31: 2 via ORAL
  Administered 2017-01-31 (×2): 1 via ORAL
  Administered 2017-02-01 (×4): 2 via ORAL
  Filled 2017-01-31 (×3): qty 2
  Filled 2017-01-31 (×2): qty 1
  Filled 2017-01-31 (×2): qty 2

## 2017-01-31 MED ORDER — ASPIRIN 81 MG PO CHEW
81.0000 mg | CHEWABLE_TABLET | Freq: Two times a day (BID) | ORAL | Status: DC
  Administered 2017-01-31 – 2017-02-01 (×2): 81 mg via ORAL
  Filled 2017-01-31 (×2): qty 1

## 2017-01-31 MED ORDER — SODIUM CHLORIDE 0.9 % IV SOLN
INTRAVENOUS | Status: DC
  Administered 2017-01-31 – 2017-02-01 (×2): via INTRAVENOUS

## 2017-01-31 MED ORDER — BUPIVACAINE-EPINEPHRINE (PF) 0.25% -1:200000 IJ SOLN
INTRAMUSCULAR | Status: AC
  Filled 2017-01-31: qty 30

## 2017-01-31 MED ORDER — ASPIRIN 81 MG PO CHEW: 81.0000 mg | CHEWABLE_TABLET | Freq: Every day | ORAL | 0 refills | Status: DC

## 2017-01-31 MED ORDER — MENTHOL 3 MG MT LOZG: 1.0000 | LOZENGE | OROMUCOSAL | Status: DC | PRN

## 2017-01-31 SURGICAL SUPPLY — 47 items
BAG DECANTER FOR FLEXI CONT (MISCELLANEOUS) IMPLANT
BAG ZIPLOCK 12X15 (MISCELLANEOUS) IMPLANT
BANDAGE ACE 6X5 VEL STRL LF (GAUZE/BANDAGES/DRESSINGS) ×2 IMPLANT
BLADE SAW SGTL 11.0X1.19X90.0M (BLADE) IMPLANT
BLADE SAW SGTL 13.0X1.19X90.0M (BLADE) ×2 IMPLANT
BONE CEMENT GENTAMICIN (Cement) ×2 IMPLANT
BOWL SMART MIX CTS (DISPOSABLE) ×2 IMPLANT
CAPT KNEE TOTAL 3 ATTUNE ×2 IMPLANT
CEMENT BONE GENTAMICIN 40 (Cement) ×1 IMPLANT
COVER SURGICAL LIGHT HANDLE (MISCELLANEOUS) ×2 IMPLANT
CUFF TOURN SGL QUICK 34 (TOURNIQUET CUFF) ×1
CUFF TRNQT CYL 34X4X40X1 (TOURNIQUET CUFF) ×1 IMPLANT
DECANTER SPIKE VIAL GLASS SM (MISCELLANEOUS) ×2 IMPLANT
DERMABOND ADVANCED (GAUZE/BANDAGES/DRESSINGS) ×1
DERMABOND ADVANCED .7 DNX12 (GAUZE/BANDAGES/DRESSINGS) ×1 IMPLANT
DRAPE U-SHAPE 47X51 STRL (DRAPES) ×2 IMPLANT
DRESSING AQUACEL AG SP 3.5X10 (GAUZE/BANDAGES/DRESSINGS) IMPLANT
DRSG AQUACEL AG ADV 3.5X10 (GAUZE/BANDAGES/DRESSINGS) ×2 IMPLANT
DRSG AQUACEL AG SP 3.5X10 (GAUZE/BANDAGES/DRESSINGS)
DURAPREP 26ML APPLICATOR (WOUND CARE) ×4 IMPLANT
ELECT REM PT RETURN 15FT ADLT (MISCELLANEOUS) ×2 IMPLANT
GLOVE BIOGEL M 7.0 STRL (GLOVE) IMPLANT
GLOVE BIOGEL PI IND STRL 7.5 (GLOVE) ×1 IMPLANT
GLOVE BIOGEL PI IND STRL 8.5 (GLOVE) ×1 IMPLANT
GLOVE BIOGEL PI INDICATOR 7.5 (GLOVE) ×1
GLOVE BIOGEL PI INDICATOR 8.5 (GLOVE) ×1
GLOVE ECLIPSE 8.0 STRL XLNG CF (GLOVE) ×2 IMPLANT
GLOVE ORTHO TXT STRL SZ7.5 (GLOVE) ×4 IMPLANT
GOWN STRL REUS W/TWL LRG LVL3 (GOWN DISPOSABLE) ×2 IMPLANT
GOWN STRL REUS W/TWL XL LVL3 (GOWN DISPOSABLE) ×2 IMPLANT
HANDPIECE INTERPULSE COAX TIP (DISPOSABLE) ×1
MANIFOLD NEPTUNE II (INSTRUMENTS) ×2 IMPLANT
PACK TOTAL KNEE CUSTOM (KITS) ×2 IMPLANT
POSITIONER SURGICAL ARM (MISCELLANEOUS) ×2 IMPLANT
SET HNDPC FAN SPRY TIP SCT (DISPOSABLE) ×1 IMPLANT
SET PAD KNEE POSITIONER (MISCELLANEOUS) ×2 IMPLANT
SUT MNCRL AB 4-0 PS2 18 (SUTURE) ×2 IMPLANT
SUT STRATAFIX 0 PDS 27 VIOLET (SUTURE) ×2
SUT VIC AB 1 CT1 36 (SUTURE) ×2 IMPLANT
SUT VIC AB 2-0 CT1 27 (SUTURE) ×3
SUT VIC AB 2-0 CT1 TAPERPNT 27 (SUTURE) ×3 IMPLANT
SUTURE STRATFX 0 PDS 27 VIOLET (SUTURE) ×1 IMPLANT
SYR 50ML LL SCALE MARK (SYRINGE) ×2 IMPLANT
TRAY FOLEY W/METER SILVER 16FR (SET/KITS/TRAYS/PACK) ×2 IMPLANT
WATER STERILE IRR 1500ML POUR (IV SOLUTION) ×2 IMPLANT
WRAP KNEE MAXI GEL POST OP (GAUZE/BANDAGES/DRESSINGS) ×2 IMPLANT
YANKAUER SUCT BULB TIP 10FT TU (MISCELLANEOUS) ×2 IMPLANT

## 2017-01-31 NOTE — Anesthesia Procedure Notes (Signed)
Date/Time: 01/31/2017 11:45 AM Performed by: Danley Danker L Oxygen Delivery Method: Simple face mask

## 2017-01-31 NOTE — Anesthesia Postprocedure Evaluation (Signed)
Anesthesia Post Note  Patient: Megan Church  Procedure(s) Performed: Procedure(s) (LRB): RIGHT TOTAL KNEE ARTHROPLASTY (Right)     Patient location during evaluation: PACU Anesthesia Type: MAC Level of consciousness: awake and alert Pain management: pain level controlled Vital Signs Assessment: post-procedure vital signs reviewed and stable Respiratory status: spontaneous breathing and respiratory function stable Cardiovascular status: blood pressure returned to baseline and stable Postop Assessment: spinal receding Anesthetic complications: no    Last Vitals:  Vitals:   01/31/17 1430 01/31/17 1450  BP: 128/75 133/79  Pulse: 97 93  Resp: 16 16  Temp: 36.8 C 37.1 C  SpO2: 97% 95%    Last Pain:  Vitals:   01/31/17 1430  TempSrc:   PainSc: 0-No pain                 Marijose Curington DANIEL

## 2017-01-31 NOTE — Op Note (Signed)
NAME:  Megan Church                      MEDICAL RECORD NO.:  [id number]                             FACILITY:  [facility]      PHYSICIAN:  Pietro Cassis. Alvan Dame, M.D.  DATE OF BIRTH:  Jan 25, 1956      DATE OF PROCEDURE:  01/31/2017                                     OPERATIVE REPORT         PREOPERATIVE DIAGNOSIS:  Right knee osteoarthritis.      POSTOPERATIVE DIAGNOSIS:  Right knee osteoarthritis.      FINDINGS:  The patient was noted to have complete loss of cartilage and   bone-on-bone arthritis with associated osteophytes in the medial and patellofemoral compartments of   the knee with a significant synovitis and associated effusion.      PROCEDURE:  Right total knee replacement.      COMPONENTS USED:  DePuy Attune rotating platform posterior stabilized knee   system, a size 5N femur, 5 tibia, size 6 mm PS AOX insert, and 35 anatomic patellar   button.      SURGEON:  Pietro Cassis. Alvan Dame, M.D.      ASSISTANT:  Danae Orleans, PA-C.      ANESTHESIA:  Regional and Spinal.      SPECIMENS:  None.      COMPLICATION:  None.      DRAINS:  None.  EBL: <100cc      TOURNIQUET TIME:   Total Tourniquet Time Documented: Thigh (Right) - 25 minutes Total: Thigh (Right) - 25 minutes  .      The patient was stable to the recovery room.      INDICATION FOR PROCEDURE:  Megan Church is a 61 y.o. female patient of   mine.  The patient had been seen, evaluated, and treated conservatively in the   office with medication, activity modification, and injections.  The patient had   radiographic changes of bone-on-bone arthritis with endplate sclerosis and osteophytes noted.      The patient failed conservative measures including medication, injections, and activity modification, and at this point was ready for more definitive measures.   Based on the radiographic changes and failed conservative measures, the patient   decided to proceed with total knee replacement.  Risks of infection,   DVT,  component failure, need for revision surgery, postop course, and   expectations were all   discussed and reviewed.  Consent was obtained for benefit of pain   relief.      PROCEDURE IN DETAIL:  The patient was brought to the operative theater.   Once adequate anesthesia, preoperative antibiotics, 2 gm of Ancef, 1 gm of Tranexamic Acid, and 10 mg of Decadorn administered, the patient was positioned supine with the right thigh tourniquet placed.  The  right lower extremity was prepped and draped in sterile fashion.  A time-   out was performed identifying the patient, planned procedure, and   extremity.      The right lower extremity was placed in the [facility] leg holder.  The leg was   exsanguinated, tourniquet elevated to 250 mmHg.  A midline incision was   made  followed by median parapatellar arthrotomy.  Following initial   exposure, attention was first directed to the patella.  Precut   measurement was noted to be 22 mm.  I resected down to 13-14 mm and used a   35 anatomic patellar button to restore patellar height as well as cover the cut   surface.      The lug holes were drilled and a metal shim was placed to protect the   patella from retractors and saw blades.      At this point, attention was now directed to the femur.  The femoral   canal was opened with a drill, irrigated to try to prevent fat emboli.  An   intramedullary rod was passed at 3 degrees valgus, 10 mm of bone was   resected off the distal femur due to pre-operative flexion contracture.  Following this resection, the tibia was   subluxated anteriorly.  Using the extramedullary guide, 2 mm of bone was resected off   the proximal medial tibia.  We confirmed the gap would be   stable medially and laterally with a size 5 spacer block as well as confirmed   the cut was perpendicular in the coronal plane, checking with an alignment rod.      Once this was done, I sized the femur to be a size 5 in the anterior-    posterior dimension, chose a narrow component based on medial and   lateral dimension.  The size 5 rotation block was then pinned in   position anterior referenced using the C-clamp to set rotation.  The   anterior, posterior, and  chamfer cuts were made without difficulty nor   notching making certain that I was along the anterior cortex to help   with flexion gap stability.      The final box cut was made off the lateral aspect of distal femur.      At this point, the tibia was sized to be a size 5, the size 5 tray was   then pinned in position through the medial third of the tubercle,   drilled, and keel punched.  Trial reduction was now carried with a 5 femur,  5 tibia, a size 6  Mm PS insert, and the 35 anatomic patella botton.  The knee was brought to   extension, full extension with good flexion stability with the patella   tracking through the trochlea without application of pressure.  Given   all these findings the femoral lug holes were drilled and then the trial components were removed.  Final components were   opened and cement was mixed.  The knee was irrigated with normal saline   solution and pulse lavage.  The synovial lining was   then injected with 30 cc of 0.25% Marcaine with epinephrine and 1 cc of Toradol plus 30 cc of NS for a  total of 61 cc.      The knee was irrigated.  Final implants were then cemented onto clean and   dried cut surfaces of bone with the knee brought to extension with a size 6 mm PS trial insert.      Once the cement had fully cured, the excess cement was removed   throughout the knee.  I confirmed I was satisfied with the range of   motion and stability, and the final size 6 mm PS AOX insert was chosen.  It was   placed into the knee.  The tourniquet had been let down at 25 minutes.  No significant   hemostasis required.  The   extensor mechanism was then reapproximated using #1 Vicryl and #0 Stratafix sutures with the knee   in flexion.   The   remaining wound was closed with 2-0 Vicryl and running 4-0 Monocryl.   The knee was cleaned, dried, dressed sterilely using Dermabond and   Aquacel dressing.  The patient was then   brought to recovery room in stable condition, tolerating the procedure   well.   Please note that Physician Assistant, Danae Orleans, PA-C, was present for the entirety of the case, and was utilized for pre-operative positioning, peri-operative retractor management, general facilitation of the procedure.  He was also utilized for primary wound closure at the end of the case.              Pietro Cassis Alvan Dame, M.D.    01/31/2017 1:09 PM

## 2017-01-31 NOTE — Progress Notes (Signed)
Assisted Dr. Singer with right, ultrasound guided, adductor canal block. Side rails up, monitors on throughout procedure. See vital signs in flow sheet. Tolerated Procedure well.  

## 2017-01-31 NOTE — Progress Notes (Signed)
01/31/2017- Pt placed on CPAP for the night with 2 lpm oxygen- Tolerating well.

## 2017-01-31 NOTE — Interval H&P Note (Signed)
History and Physical Interval Note:  01/31/2017 10:55 AM  Megan Church  has presented today for surgery, with the diagnosis of Right knee osteoarthritis  The various methods of treatment have been discussed with the patient and family. After consideration of risks, benefits and other options for treatment, the patient has consented to  Procedure(s) with comments: RIGHT TOTAL KNEE ARTHROPLASTY (Right) - 90 mins as a surgical intervention .  The patient's history has been reviewed, patient examined, no change in status, stable for surgery.  I have reviewed the patient's chart and labs.  Questions were answered to the patient's satisfaction.     Mauri Pole

## 2017-01-31 NOTE — Discharge Instructions (Signed)

## 2017-01-31 NOTE — Anesthesia Preprocedure Evaluation (Addendum)
Anesthesia Evaluation  Patient identified by MRN, date of birth, ID band Patient awake    Reviewed: Allergy & Precautions, NPO status , Patient's Chart, lab work & pertinent test results  History of Anesthesia Complications Negative for: history of anesthetic complications  Airway Mallampati: II  TM Distance: >3 FB Neck ROM: Full    Dental no notable dental hx. (+) Dental Advisory Given   Pulmonary neg pulmonary ROS, former smoker,    Pulmonary exam normal        Cardiovascular negative cardio ROS Normal cardiovascular exam     Neuro/Psych PSYCHIATRIC DISORDERS Depression negative neurological ROS     GI/Hepatic Neg liver ROS, GERD  ,  Endo/Other  diabetesHypothyroidism Morbid obesity  Renal/GU negative Renal ROS  negative genitourinary   Musculoskeletal negative musculoskeletal ROS (+)   Abdominal   Peds negative pediatric ROS (+)  Hematology negative hematology ROS (+)   Anesthesia Other Findings   Reproductive/Obstetrics negative OB ROS                            Anesthesia Physical Anesthesia Plan  ASA: II  Anesthesia Plan: MAC and Spinal   Post-op Pain Management:  Regional for Post-op pain   Induction:   PONV Risk Score and Plan: 2 and Ondansetron and Dexamethasone  Airway Management Planned: Simple Face Mask and Natural Airway  Additional Equipment:   Intra-op Plan:   Post-operative Plan:   Informed Consent: I have reviewed the patients History and Physical, chart, labs and discussed the procedure including the risks, benefits and alternatives for the proposed anesthesia with the patient or authorized representative who has indicated his/her understanding and acceptance.   Dental advisory given  Plan Discussed with: Anesthesiologist, Surgeon and CRNA  Anesthesia Plan Comments: (Previous back fusion, will attempt a spinal, but may require GA. )        Anesthesia Quick Evaluation

## 2017-01-31 NOTE — Anesthesia Procedure Notes (Signed)
Anesthesia Regional Block: Adductor canal block   Pre-Anesthetic Checklist: ,, timeout performed, Correct Patient, Correct Site, Correct Laterality, Correct Procedure, Correct Position, site marked, Risks and benefits discussed,  Surgical consent,  Pre-op evaluation,  At surgeon's request and post-op pain management  Laterality: Right  Prep: chloraprep       Needles:  Injection technique: Single-shot  Needle Type: Stimulator Needle - 80     Needle Length: 10cm  Needle Gauge: 21     Additional Needles:   Procedures: ultrasound guided,,,,,,,,  Narrative:  Start time: 01/31/2017 10:51 AM End time: 01/31/2017 10:59 AM Injection made incrementally with aspirations every 5 mL.  Performed by: Personally

## 2017-01-31 NOTE — Transfer of Care (Signed)
Immediate Anesthesia Transfer of Care Note  Patient: Megan Church  Procedure(s) Performed: Procedure(s) with comments: RIGHT TOTAL KNEE ARTHROPLASTY (Right) - 90 mins  Patient Location: PACU  Anesthesia Type:Spinal  Level of Consciousness: awake and alert   Airway & Oxygen Therapy: Patient Spontanous Breathing and Patient connected to face mask oxygen  Post-op Assessment: Report given to RN and Post -op Vital signs reviewed and stable  Post vital signs: Reviewed and stable  Last Vitals:  Vitals:   01/31/17 1109 01/31/17 1115  BP: 116/65 111/63  Pulse: 85 79  Resp: 20 18  Temp:    SpO2: 98% 94%    Last Pain:  Vitals:   01/31/17 0943  TempSrc: Oral      Patients Stated Pain Goal: 4 (50/01/64 2903)  Complications: No apparent anesthesia complications

## 2017-01-31 NOTE — Anesthesia Procedure Notes (Signed)
Spinal  Patient location during procedure: OR Start time: 01/31/2017 11:46 AM End time: 01/31/2017 11:51 AM Staffing Resident/CRNA: Danley Danker L Performed: resident/CRNA  Preanesthetic Checklist Completed: patient identified, site marked, surgical consent, pre-op evaluation, timeout performed, IV checked, risks and benefits discussed and monitors and equipment checked Spinal Block Patient position: sitting Prep: Betadine Patient monitoring: heart rate, continuous pulse ox and blood pressure Approach: midline Location: L2-3 Injection technique: single-shot Needle Needle type: Sprotte  Needle gauge: 24 G Needle length: 9 cm Additional Notes Kit expiration date 07/15/2019 and lot # [id number] Clear free flow CSF, negative heme, negative paresthesia Tolerated well and returned to supine position

## 2017-02-01 ENCOUNTER — Encounter (HOSPITAL_COMMUNITY): Payer: Self-pay | Admitting: Orthopedic Surgery

## 2017-02-01 DIAGNOSIS — M1711 Unilateral primary osteoarthritis, right knee: Secondary | ICD-10-CM | POA: Diagnosis not present

## 2017-02-01 LAB — CBC
HCT: 30.7 % — ABNORMAL LOW (ref 36.0–46.0)
Hemoglobin: 9.9 g/dL — ABNORMAL LOW (ref 12.0–15.0)
MCH: 29.3 pg (ref 26.0–34.0)
MCHC: 32.2 g/dL (ref 30.0–36.0)
MCV: 90.8 fL (ref 78.0–100.0)
Platelets: 270 10*3/uL (ref 150–400)
RBC: 3.38 MIL/uL — ABNORMAL LOW (ref 3.87–5.11)
RDW: 15.1 % (ref 11.5–15.5)
WBC: 9.2 10*3/uL (ref 4.0–10.5)

## 2017-02-01 LAB — GLUCOSE, CAPILLARY
Glucose-Capillary: 159 mg/dL — ABNORMAL HIGH (ref 65–99)
Glucose-Capillary: 169 mg/dL — ABNORMAL HIGH (ref 65–99)

## 2017-02-01 LAB — BASIC METABOLIC PANEL
Anion gap: 7 (ref 5–15)
BUN: 9 mg/dL (ref 6–20)
CO2: 29 mmol/L (ref 22–32)
Calcium: 9.2 mg/dL (ref 8.9–10.3)
Chloride: 105 mmol/L (ref 101–111)
Creatinine, Ser: 0.63 mg/dL (ref 0.44–1.00)
GFR calc Af Amer: >60 mL/min (ref >60)
GFR calc non Af Amer: >60 mL/min (ref >60)
Glucose, Bld: 174 mg/dL — ABNORMAL HIGH (ref 65–99)
Potassium: 4 mmol/L (ref 3.5–5.1)
Sodium: 141 mmol/L (ref 135–145)

## 2017-02-01 NOTE — Evaluation (Signed)
Physical Therapy Evaluation Patient Details Name: Levora Werden MRN: [id number] DOB: September 22, 1955 Today's Date: 02/01/2017   History of Present Illness  s/p R TKA  Clinical Impression  The patient  Is progressing well. Plans for DC to day after therapy.  Pt admitted with above diagnosis. Pt currently with functional limitations due to the deficits listed below (see PT Problem List).  Pt will benefit from skilled PT to increase their independence and safety with mobility to allow discharge to the venue listed below.       Follow Up Recommendations DC plan and follow up therapy as arranged by surgeon    Equipment Recommendations  None recommended by PT    Recommendations for Other Services       Precautions / Restrictions Precautions Precautions: Knee;Fall Restrictions Weight Bearing Restrictions: No      Mobility  Bed Mobility Overal bed mobility: Needs Assistance Bed Mobility: Supine to Sit     Supine to sit: Min guard     General bed mobility comments: manages the right leg without assistance  Transfers Overall transfer level: Needs assistance Equipment used: Rolling walker (2 wheeled) Transfers: Sit to/from Stand Sit to Stand: Min guard         General transfer comment: for safety; cues for UE/LE placement  Ambulation/Gait Ambulation/Gait assistance: Min guard Ambulation Distance (Feet): 180 Feet Assistive device: Rolling walker (2 wheeled) Gait Pattern/deviations: Step-to pattern;Step-through pattern;Antalgic Gait velocity: WFL   General Gait Details: cues for sequence and posture  Stairs            Wheelchair Mobility    Modified Rankin (Stroke Patients Only)       Balance                                             Pertinent Vitals/Pain Pain Assessment: Faces Pain Score: 4  Faces Pain Scale: Hurts little more Pain Location: R knee Pain Descriptors / Indicators: Aching Pain Intervention(s): Limited activity  within patient's tolerance;Monitored during session;Premedicated before session;Ice applied    Home Living Family/patient expects to be discharged to:: Private residence Living Arrangements: Spouse/significant other Available Help at Discharge: Family           Home Equipment: Shower seat      Prior Function Level of Independence: Independent               Journalist, newspaper        Extremity/Trunk Assessment   Upper Extremity Assessment Upper Extremity Assessment: Defer to OT evaluation    Lower Extremity Assessment Lower Extremity Assessment: RLE deficits/detail RLE Deficits / Details: + SLR, knee flexion 10-70    Cervical / Trunk Assessment Cervical / Trunk Assessment: Normal  Communication   Communication: No difficulties  Cognition Arousal/Alertness: Awake/alert Behavior During Therapy: WFL for tasks assessed/performed Overall Cognitive Status: Within Functional Limits for tasks assessed                                        General Comments      Exercises Total Joint Exercises Ankle Circles/Pumps: AROM;10 reps;Both Quad Sets: AROM;10 reps;Both Gluteal Sets: 10 reps Towel Squeeze: AROM;10 reps;Both Heel Slides: AAROM;Right;10 reps Hip ABduction/ADduction: AROM;Right;10 reps Straight Leg Raises: AROM;Right;10 reps Long Arc Quad: AROM;Right;10 reps Knee Flexion: AROM;Right;10 reps   Assessment/Plan  PT Assessment Patient needs continued PT services  PT Problem List Decreased strength;Decreased range of motion;Decreased activity tolerance;Decreased mobility;Decreased knowledge of precautions;Decreased safety awareness;Decreased knowledge of use of DME;Pain       PT Treatment Interventions DME instruction;Functional mobility training;Therapeutic activities;Gait training;Patient/family education    PT Goals (Current goals can be found in the Care Plan section)  Acute Rehab PT Goals Patient Stated Goal: return to independence PT  Goal Formulation: With patient/family Time For Goal Achievement: 02/04/17 Potential to Achieve Goals: Good    Frequency 7X/week   Barriers to discharge        Co-evaluation               AM-PAC PT "6 Clicks" Daily Activity  Outcome Measure Difficulty turning over in bed (including adjusting bedclothes, sheets and blankets)?: A Little Difficulty moving from lying on back to sitting on the side of the bed? : A Little Difficulty sitting down on and standing up from a chair with arms (e.g., wheelchair, bedside commode, etc,.)?: A Little Help needed moving to and from a bed to chair (including a wheelchair)?: A Little Help needed walking in hospital room?: A Little Help needed climbing 3-5 steps with a railing? : A Lot 6 Click Score: 17    End of Session   Activity Tolerance: Patient tolerated treatment well Patient left: in chair;with call bell/phone within reach;with family/visitor present Nurse Communication: Mobility status PT Visit Diagnosis: Difficulty in walking, not elsewhere classified (R26.2);Pain Pain - Right/Left: Right Pain - part of body: Knee    Time: 5462-7035 PT Time Calculation (min) (ACUTE ONLY): 36 min   Charges:   PT Evaluation $PT Eval Low Complexity: 1 Low PT Treatments $Gait Training: 8-22 mins   PT G CodesTresa Endo PT [phone]   Claretha Cooper 02/01/2017, 12:45 PM

## 2017-02-01 NOTE — Addendum Note (Signed)
Addendum  created 02/01/17 0631 by Lollie Sails, CRNA   Charge Capture section accepted, Visit diagnoses modified

## 2017-02-01 NOTE — Progress Notes (Signed)
Patient ID: Megan Church, female   DOB: 07-04-1955, 61 y.o.   MRN: [id number] Subjective: 1 Day Post-Op Procedure(s) (LRB): RIGHT TOTAL KNEE ARTHROPLASTY (Right)    Patient reports pain as mild to moderate. No events over night. Sugars elevated to >300 this am  Objective:   VITALS:   Vitals:   02/01/17 0630 02/01/17 0926  BP: 100/61 (!) 100/59  Pulse: 79 85  Resp: 15 14  Temp: 97.8 F (36.6 C) 98.2 F (36.8 C)  SpO2: 96% 91%    Neurovascular intact Incision: dressing C/D/I  LABS  Recent Labs  02/01/17 0755  HGB 9.9*  HCT 30.7*  WBC 9.2  PLT 270     Recent Labs  02/01/17 0755  NA 141  K 4.0  BUN 9  CREATININE 0.63  GLUCOSE 174*    No results for input(s): LABPT, INR in the last 72 hours.   Assessment/Plan: 1 Day Post-Op Procedure(s) (LRB): RIGHT TOTAL KNEE ARTHROPLASTY (Right)   Advance diet Up with therapy   Close monitor and control of sugars at this point Plan for discharge to home today after therapy Reviewed goals and stressed maximizing therapy exercises on her own due to diabetes

## 2017-02-01 NOTE — Evaluation (Signed)
Occupational Therapy Evaluation Patient Details Name: Nedra Mcinnis MRN: [id number] DOB: Apr 17, 1956 Today's Date: 02/01/2017    History of Present Illness s/p R TKA   Clinical Impression   This 61 year old female was admitted for the above sx. All education was completed. No further OT is needed at this time    Follow Up Recommendations  No OT follow up    Equipment Recommendations  None recommended by OT    Recommendations for Other Services       Precautions / Restrictions Precautions Precautions: Knee;Fall Restrictions Weight Bearing Restrictions: No      Mobility Bed Mobility               General bed mobility comments: oob  Transfers Overall transfer level: Needs assistance Equipment used: Rolling walker (2 wheeled) Transfers: Sit to/from Stand Sit to Stand: Min guard         General transfer comment: for safety; cues for UE/LE placement    Balance                                           ADL either performed or assessed with clinical judgement   ADL Overall ADL's : Needs assistance/impaired     Grooming: Supervision/safety;Wash/dry hands;Standing   Upper Body Bathing: Set up;Sitting   Lower Body Bathing: Minimal assistance;Sit to/from stand   Upper Body Dressing : Set up;Sitting   Lower Body Dressing: Minimal assistance;Sit to/from stand   Toilet Transfer: Min guard;Ambulation;BSC;RW   Toileting- Water quality scientist and Hygiene: Min guard;Sit to/from stand   Tub/ Shower Transfer: Walk-in shower;Min guard;Ambulation;3 in 1     General ADL Comments: ambulated to bathroom, washed up, got dressed and practiced shower transfer over small ledge (simulated this)     Vision         Perception     Praxis      Pertinent Vitals/Pain Pain Assessment: Faces Pain Score: 4  Faces Pain Scale: Hurts little more Pain Location: R knee Pain Descriptors / Indicators: Aching Pain Intervention(s): Limited activity  within patient's tolerance;Monitored during session;Premedicated before session;Repositioned;Ice applied     Hand Dominance     Extremity/Trunk Assessment Upper Extremity Assessment Upper Extremity Assessment: Overall WFL for tasks assessed           Communication Communication Communication: No difficulties   Cognition Arousal/Alertness: Awake/alert Behavior During Therapy: WFL for tasks assessed/performed Overall Cognitive Status: Within Functional Limits for tasks assessed                                     General Comments       Exercises     Shoulder Instructions      Home Living Family/patient expects to be discharged to:: Private residence Living Arrangements: Spouse/significant other Available Help at Discharge: Family               Bathroom Shower/Tub: Walk-in Corporate treasurer Toilet: Handicapped height     Home Equipment: Shower seat          Prior Functioning/Environment Level of Independence: Independent                 OT Problem List:        OT Treatment/Interventions:      OT Goals(Current goals can be found in the  care plan section) Acute Rehab OT Goals Patient Stated Goal: return to independence OT Goal Formulation: All assessment and education complete, DC therapy  OT Frequency:     Barriers to D/C:            Co-evaluation              AM-PAC PT "6 Clicks" Daily Activity     Outcome Measure Help from another person eating meals?: None Help from another person taking care of personal grooming?: A Little Help from another person toileting, which includes using toliet, bedpan, or urinal?: A Little Help from another person bathing (including washing, rinsing, drying)?: A Little Help from another person to put on and taking off regular upper body clothing?: A Little Help from another person to put on and taking off regular lower body clothing?: A Little 6 Click Score: 19   End of Session     Activity Tolerance: Patient tolerated treatment well Patient left: in chair;with call bell/phone within reach  OT Visit Diagnosis: Pain Pain - Right/Left: Right Pain - part of body: Knee                Time: 4585-9292 OT Time Calculation (min): 20 min Charges:  OT General Charges $OT Visit: 1 Procedure OT Evaluation $OT Eval Low Complexity: 1 Procedure G-Codes: OT G-codes **NOT FOR INPATIENT CLASS** Functional Assessment Tool Used: Clinical judgement Functional Limitation: Self care Self Care Current Status (K4628): At least 20 percent but less than 40 percent impaired, limited or restricted Self Care Goal Status (M3817): At least 20 percent but less than 40 percent impaired, limited or restricted Self Care Discharge Status [id number]): At least 20 percent but less than 40 percent impaired, limited or restricted   Lesle Chris, OTR/L [phone] 02/01/2017  Larance Ratledge 02/01/2017, 12:16 PM

## 2017-02-01 NOTE — Progress Notes (Signed)
Assessment unchanged. Doing well and ready for dc home. Pt and husband verbalized understanding via teach back including follow up care, how to care for knee dressing, when to call MD if any problems occur and medications to resume. IV dc'd. Discharged via wc to front entrance accompanied by NT to meet husband and awaiting vehicle to carry home.

## 2017-02-01 NOTE — Progress Notes (Signed)
Physical Therapy Treatment Patient Details Name: Megan Church MRN: [id number] DOB: 1955/12/02 Today's Date: 02/01/2017    History of Present Illness s/p R TKA    PT Comments    The patient is progressing well. Ready for DC.    Follow Up Recommendations  DC plan and follow up therapy as arranged by surgeon     Equipment Recommendations  None recommended by PT    Recommendations for Other Services       Precautions / Restrictions Precautions Precautions: Knee;Fall    Mobility  Bed Mobility Overal bed mobility: Modified Independent             General bed mobility comments: manages the right leg without assistance  Transfers   Equipment used: Rolling walker (2 wheeled) Transfers: Sit to/from Stand Sit to Stand: Supervision         General transfer comment: for safety; cues for UE/LE placement  Ambulation/Gait Ambulation/Gait assistance: Supervision Ambulation Distance (Feet): 180 Feet Assistive device: Rolling walker (2 wheeled) Gait Pattern/deviations: Step-to pattern;Step-through pattern     General Gait Details: cues for sequence and posture   Stairs            Wheelchair Mobility    Modified Rankin (Stroke Patients Only)       Balance                                            Cognition Arousal/Alertness: Awake/alert                                            Exercises Total Joint Exercises Heel Slides: AROM Long Arc Quad: AROM;Right;10 reps Knee Flexion: AROM;Right;10 reps Reviewed HEP   General Comments        Pertinent Vitals/Pain Faces Pain Scale: Hurts little more Pain Location: R knee Pain Descriptors / Indicators: Aching Pain Intervention(s): Repositioned;Premedicated before session;Monitored during session    Home Living                      Prior Function            PT Goals (current goals can now be found in the care plan section) Progress towards PT  goals: Progressing toward goals    Frequency    7X/week      PT Plan Current plan remains appropriate    Co-evaluation              AM-PAC PT "6 Clicks" Daily Activity  Outcome Measure  Difficulty turning over in bed (including adjusting bedclothes, sheets and blankets)?: None Difficulty moving from lying on back to sitting on the side of the bed? : None Difficulty sitting down on and standing up from a chair with arms (e.g., wheelchair, bedside commode, etc,.)?: A Little Help needed moving to and from a bed to chair (including a wheelchair)?: A Little Help needed walking in hospital room?: A Little Help needed climbing 3-5 steps with a railing? : A Lot 6 Click Score: 19    End of Session   Activity Tolerance: Patient tolerated treatment well Patient left: in chair;with call bell/phone within reach Nurse Communication: Mobility status PT Visit Diagnosis: Difficulty in walking, not elsewhere classified (R26.2);Pain Pain - Right/Left: Right Pain - part of body: Knee  Time: 4171-2787 PT Time Calculation (min) (ACUTE ONLY): 18 min  Charges:  $Gait Training: 8-22 mins                    G CodesTresa Endo PT [phone]    Claretha Cooper 02/01/2017, 5:33 PM

## 2017-02-01 NOTE — Progress Notes (Signed)
   02/01/17 1244  PT Time Calculation  PT Start Time (ACUTE ONLY) 0954  PT Stop Time (ACUTE ONLY) 1030  PT Time Calculation (min) (ACUTE ONLY) 36 min  PT G-Codes **NOT FOR INPATIENT CLASS**  Functional Assessment Tool Used Clinical judgement  Functional Limitation Changing and maintaining body position  Changing and Maintaining Body Position Current Status (Z3582) CJ  Changing and Maintaining Body Position Goal Status (P1898) CI  PT General Charges  $$ ACUTE PT VISIT 1 Visit  PT Evaluation  $PT Eval Low Complexity 1 Low  PT Treatments  $Gait Training 8-22 mins  [place] PT [phone]

## 2017-02-01 NOTE — Progress Notes (Signed)
Discharge planning, no HH needs identified. Plan for OP PT, has DME. [phone] 

## 2017-02-07 NOTE — Discharge Summary (Signed)
Physician Discharge Summary  Patient ID: Megan Church MRN: [id number] DOB/AGE: 62-07-1955 62 y.o.  Admit date: 01/31/2017 Discharge date: 02/01/2017   Procedures:  Procedure(s) (LRB): RIGHT TOTAL KNEE ARTHROPLASTY (Right)  Attending Physician:  Dr. Paralee Cancel   Admission Diagnoses:   Right knee primary OA / pain  Discharge Diagnoses:  Principal Problem:   S/P right TKA  Past Medical History:  Diagnosis Date  . Arthritis   . Depression   . Diabetes mellitus without complication ([place])    2  . GERD (gastroesophageal reflux disease)   . HLD (hyperlipidemia)   . Hypothyroidism   . RLS (restless legs syndrome)   . Shingles     HPI:    Megan Church, 61 y.o. female, has a history of pain and functional disability in the right knee due to arthritis and has failed non-surgical conservative treatments for greater than 12 weeks to include NSAID's and/or analgesics, corticosteriod injections, viscosupplementation injections and activity modification.  Onset of symptoms was gradual, starting 3+ years ago with gradually worsening course since that time. The patient noted prior procedures on the knee to include  arthroscopy and menisectomy on the right knee(s).  Patient currently rates pain in the right knee(s) at 10 out of 10 with activity. Patient has night pain, worsening of pain with activity and weight bearing, pain that interferes with activities of daily living, pain with passive range of motion, crepitus and joint swelling.  Patient has evidence of periarticular osteophytes and joint space narrowing by imaging studies. There is no active infection.  Risks, benefits and expectations were discussed with the patient.  Risks including but not limited to the risk of anesthesia, blood clots, nerve damage, blood vessel damage, failure of the prosthesis, infection and up to and including death.  Patient understand the risks, benefits and expectations and wishes to proceed with surgery.    PCP: [name], MD   Discharged Condition: good  Hospital Course:  Patient underwent the above stated procedure on 01/31/2017. Patient tolerated the procedure well and brought to the recovery room in good condition and subsequently to the floor.  POD #1 BP: 100/59 ; Pulse: 85 ; Temp: 98.2 F (36.8 C) ; Resp: 14 Patient reports pain as mild to moderate. No events over night.  Sugars elevated to >300 this am.  Ready to be discharged home.  Neurovascular intact and incision: dressing C/D/I.  LABS  Basename    HGB     9.9  HCT     30.7     Discharge Exam: General appearance: alert, cooperative and no distress Extremities: Homans sign is negative, no sign of DVT, no edema, redness or tenderness in the calves or thighs and no ulcers, gangrene or trophic changes  Disposition: Home with follow up in 2 weeks   Follow-up Information    Paralee Cancel, MD. Schedule an appointment as soon as possible for a visit in 2 week(s).   Specialty:  Orthopedic Surgery Contact information: [address] [place] [address] [phone]           Discharge Instructions    Call MD / Call 911    Complete by:  As directed    If you experience chest pain or shortness of breath, CALL 911 and be transported to the hospital emergency room.  If you develope a fever above 101 F, pus (white drainage) or increased drainage or redness at the wound, or calf pain, call your surgeon's office.   Change dressing  Complete by:  As directed    Maintain surgical dressing until follow up in the clinic. If the edges start to pull up, may reinforce with tape. If the dressing is no longer working, may remove and cover with gauze and tape, but must keep the area dry and clean.  Call with any questions or concerns.   Constipation Prevention    Complete by:  As directed    Drink plenty of fluids.  Prune juice may be helpful.  You may use a stool softener, such as Colace (over the counter)  100 mg twice a day.  Use MiraLax (over the counter) for constipation as needed.   Diet - low sodium heart healthy    Complete by:  As directed    Discharge instructions    Complete by:  As directed    Maintain surgical dressing until follow up in the clinic. If the edges start to pull up, may reinforce with tape. If the dressing is no longer working, may remove and cover with gauze and tape, but must keep the area dry and clean.  Follow up in 2 weeks at [facility]. Call with any questions or concerns.   Increase activity slowly as tolerated    Complete by:  As directed    Weight bearing as tolerated with assist device (walker, cane, etc) as directed, use it as long as suggested by your surgeon or therapist, typically at least 4-6 weeks.   TED hose    Complete by:  As directed    Use stockings (TED hose) for 2 weeks on both leg(s).  You may remove them at night for sleeping.      Allergies as of 02/01/2017      Reactions   Diclofenac Sodium Rash   Sulfa Antibiotics Hives   Ace Inhibitors Cough   Invokana [canagliflozin] Other (See Comments)   Yeast    Vytorin [ezetimibe-simvastatin] Other (See Comments)   Leg cramps   Atorvastatin Hives      Medication List    STOP taking these medications   ibuprofen 200 MG tablet Commonly known as:  ADVIL,MOTRIN     TAKE these medications   aspirin 81 MG chewable tablet Chew 1 tablet (81 mg total) by mouth daily. Take for 4 weeks.   CALCIUM 500/VITAMIN D PO Take 1 tablet by mouth daily with supper.   docusate sodium 100 MG capsule Commonly known as:  COLACE Take 1 capsule (100 mg total) by mouth 2 (two) times daily.   escitalopram 20 MG tablet Commonly known as:  LEXAPRO Take 20 mg by mouth at bedtime.   fenofibrate 160 MG tablet Take 160 mg by mouth at bedtime.   ferrous sulfate 325 (65 FE) MG tablet Commonly known as:  FERROUSUL Take 1 tablet (325 mg total) by mouth 3 (three) times daily with meals.   furosemide  40 MG tablet Commonly known as:  LASIX Take 40 mg by mouth every other day.   gabapentin 600 MG tablet Commonly known as:  NEURONTIN Take 600 mg by mouth at bedtime.   HYDROcodone-acetaminophen 7.5-325 MG tablet Commonly known as:  NORCO Take 1-2 tablets by mouth every 4 (four) hours as needed for moderate pain or severe pain.   LEVEMIR FLEXTOUCH 100 UNIT/ML Pen Generic drug:  Insulin Detemir Inject 60 Units into the skin at bedtime.   levothyroxine 100 MCG tablet Commonly known as:  SYNTHROID, LEVOTHROID Take 100 mcg by mouth daily.   magnesium oxide 400 MG tablet Commonly known  as:  MAG-OX Take 400 mg by mouth at bedtime.   metFORMIN 1000 MG tablet Commonly known as:  GLUCOPHAGE Take 1,000 mg by mouth 2 (two) times daily.   methocarbamol 500 MG tablet Commonly known as:  ROBAXIN Take 1 tablet (500 mg total) by mouth every 6 (six) hours as needed for muscle spasms.   omeprazole 20 MG capsule Commonly known as:  PRILOSEC Take 20 mg by mouth every other day.   polyethylene glycol packet Commonly known as:  MIRALAX / GLYCOLAX Take 17 g by mouth 2 (two) times daily.   rOPINIRole 3 MG tablet Commonly known as:  REQUIP Take 3 mg by mouth at bedtime.   rosuvastatin 20 MG tablet Commonly known as:  CRESTOR Take 20 mg by mouth at bedtime.   SALONPAS PAIN RELIEF PATCH EX Apply 1 patch topically at bedtime as needed (knee pain).   VICTOZA 18 MG/3ML Sopn Generic drug:  liraglutide INJECT AS DIRECTED ONCE DAILY 18 mg daily   vitamin B-12 1000 MCG tablet Commonly known as:  CYANOCOBALAMIN Take 1,000 mcg by mouth daily.   Vitamin D3 5000 units Caps Take 5,000 Units by mouth daily.            Discharge Care Instructions        Start     Ordered   02/01/17 0000  Call MD / Call 911    Comments:  If you experience chest pain or shortness of breath, CALL 911 and be transported to the hospital emergency room.  If you develope a fever above 101 F, pus (white  drainage) or increased drainage or redness at the wound, or calf pain, call your surgeon's office.   02/01/17 0944   02/01/17 0000  Discharge instructions    Comments:  Maintain surgical dressing until follow up in the clinic. If the edges start to pull up, may reinforce with tape. If the dressing is no longer working, may remove and cover with gauze and tape, but must keep the area dry and clean.  Follow [facility] in 2 [facility]. Call with any questions or concerns.   02/01/17 0944   02/01/17 0000  Diet - low sodium heart healthy     02/01/17 0944   02/01/17 0000  Constipation Prevention    Comments:  Drink plenty of fluids.  Prune juice may be helpful.  You may use a stool softener, such as Colace (over the counter) 100 mg twice a day.  Use MiraLax (over the counter) for constipation as needed.   02/01/17 0944   02/01/17 0000  Increase activity slowly as tolerated    Comments:  Weight bearing as tolerated with assist device (walker, cane, etc) as directed, use it as long as suggested by your surgeon or therapist, typically at least 4-6 weeks.   02/01/17 0944   02/01/17 0000  TED hose    Comments:  Use stockings (TED hose) for 2 weeks on both leg(s).  You may remove them at night for sleeping.   02/01/17 0944   02/01/17 0000  Change dressing    Comments:  Maintain surgical dressing until follow up in the clinic. If the edges start to pull up, may reinforce with tape. If the dressing is no longer working, may remove and cover with gauze and tape, but must keep the area dry and clean.  Call with any questions or concerns.   02/01/17 0944   01/31/17 0000  docusate sodium (COLACE) 100 MG capsule  2 times  daily    Question:  Supervising Provider  Answer:  Paralee Cancel   01/31/17 1355   01/31/17 0000  ferrous sulfate (FERROUSUL) 325 (65 FE) MG tablet  3 times daily with meals    Question:  Supervising Provider  Answer:  Paralee Cancel   01/31/17 1355   01/31/17 0000  polyethylene  glycol (MIRALAX / GLYCOLAX) packet  2 times daily    Question:  Supervising Provider  Answer:  Paralee Cancel   01/31/17 1355   01/31/17 0000  methocarbamol (ROBAXIN) 500 MG tablet  Every 6 hours PRN    Question:  Supervising Provider  Answer:  Paralee Cancel   01/31/17 1355   01/31/17 0000  aspirin 81 MG chewable tablet  Daily    Question:  Supervising Provider  Answer:  Paralee Cancel   01/31/17 1355   01/31/17 0000  HYDROcodone-acetaminophen (NORCO) 7.5-325 MG tablet  Every 4 hours PRN    Question:  Supervising Provider  Answer:  Paralee Cancel   01/31/17 1355       Signed: [place] Pugh. Kurt Hoffmeier   PA-C  02/07/2017, 11:56 AM

## 2017-03-21 ENCOUNTER — Other Ambulatory Visit (HOSPITAL_COMMUNITY): Payer: Self-pay | Admitting: Internal Medicine

## 2017-03-21 ENCOUNTER — Other Ambulatory Visit: Payer: Self-pay | Admitting: Hematology and Oncology

## 2017-03-21 DIAGNOSIS — R590 Localized enlarged lymph nodes: Secondary | ICD-10-CM

## 2017-03-24 ENCOUNTER — Ambulatory Visit (HOSPITAL_COMMUNITY)
Admission: RE | Admit: 2017-03-24 | Discharge: 2017-03-24 | Disposition: A | Discharge status: Home / Self Care | Payer: Managed Care, Other (non HMO) | Source: Ambulatory Visit | Attending: Internal Medicine | Admitting: Internal Medicine

## 2017-03-24 DIAGNOSIS — R590 Localized enlarged lymph nodes: Secondary | ICD-10-CM | POA: Diagnosis not present

## 2017-05-02 ENCOUNTER — Other Ambulatory Visit (HOSPITAL_COMMUNITY): Payer: Self-pay | Admitting: Internal Medicine

## 2017-05-02 DIAGNOSIS — E041 Nontoxic single thyroid nodule: Secondary | ICD-10-CM

## 2017-05-10 ENCOUNTER — Ambulatory Visit (HOSPITAL_COMMUNITY)
Admission: RE | Admit: 2017-05-10 | Discharge: 2017-05-10 | Disposition: A | Discharge status: Home / Self Care | Payer: Managed Care, Other (non HMO) | Source: Ambulatory Visit | Attending: Internal Medicine | Admitting: Internal Medicine

## 2017-05-10 DIAGNOSIS — E041 Nontoxic single thyroid nodule: Secondary | ICD-10-CM | POA: Diagnosis not present

## 2017-05-12 ENCOUNTER — Other Ambulatory Visit (HOSPITAL_COMMUNITY): Payer: Self-pay | Admitting: Internal Medicine

## 2017-05-12 DIAGNOSIS — E041 Nontoxic single thyroid nodule: Secondary | ICD-10-CM

## 2017-05-19 ENCOUNTER — Ambulatory Visit (HOSPITAL_COMMUNITY)
Admission: RE | Admit: 2017-05-19 | Discharge: 2017-05-19 | Disposition: A | Discharge status: Home / Self Care | Payer: Managed Care, Other (non HMO) | Source: Ambulatory Visit | Attending: Internal Medicine | Admitting: Internal Medicine

## 2017-05-19 ENCOUNTER — Encounter (HOSPITAL_COMMUNITY): Payer: Self-pay

## 2017-05-19 DIAGNOSIS — E041 Nontoxic single thyroid nodule: Secondary | ICD-10-CM

## 2017-05-19 MED ORDER — LIDOCAINE HCL (PF) 2 % IJ SOLN
INTRAMUSCULAR | Status: AC
  Filled 2017-05-19: qty 10

## 2017-05-19 NOTE — Discharge Instructions (Signed)
Thyroid Biopsy °The thyroid gland is a butterfly-shaped gland located in the front of the neck. It produces hormones that affect metabolism, growth and development, and body temperature. Thyroid biopsy is a procedure in which small samples of tissue or fluid are removed from the thyroid gland. The samples are then looked at under a microscope to check for abnormalities. This procedure is done to determine the cause of thyroid problems. It may be done to check for infection, cancer, or other thyroid problems. °Two methods may be used for a thyroid biopsy. In one method, a thin needle is inserted through the skin and into the thyroid gland. In the other method, an open incision is made through the skin. °Tell a health care provider about: °· Any allergies you have. °· All medicines you are taking, including vitamins, herbs, eye drops, creams, and over-the-counter medicines. °· Any problems you or family members have had with anesthetic medicines. °· Any blood disorders you have. °· Any surgeries you have had. °· Any medical conditions you have. °What are the risks? °Generally, this is a safe procedure. However, problems can occur and include: °· Bleeding from the procedure site. °· Infection. °· Injury to structures near the thyroid gland. ° °What happens before the procedure? °· Ask your health care provider about: °? Changing or stopping your regular medicines. This is especially important if you are taking diabetes medicines or blood thinners. °? Taking medicines such as aspirin and ibuprofen. These medicines can thin your blood. Do not take these medicines before your procedure if your health care provider asks you not to. °· Do not eat or drink anything after midnight on the night before the procedure or as directed by your health care provider. °· You may have a blood sample taken. °What happens during the procedure? °Either of these methods may be used to perform a thyroid biopsy: °· Fine needle biopsy. You may  be given medicine to help you relax (sedative). You will be asked to lie on your back with your head tipped backward to extend your neck. An area on your neck will be cleaned. A needle will then be inserted through the skin of your neck. You may be asked to avoid coughing, talking, swallowing, or making sounds during some portions of the procedure. The needle will be withdrawn once the tissue or fluid samples have been removed. Pressure may be applied to your neck to reduce swelling and ensure that bleeding has stopped. The samples will be sent to a lab for examination. °· Open biopsy. You will be given medicine to make you sleep (general anesthetic). An incision will be made in your neck. A sample of thyroid tissue will be removed using surgical tools. The tissue sample will be sent for examination. In some cases, the sample may be examined during the biopsy. If that is done and cancer cells are found, some or all of the thyroid gland may be removed. The incision will be closed with stitches. ° °What happens after the procedure? °· Your recovery will be assessed and monitored. °· You may have soreness and tenderness at the site of the biopsy. This should go away after a few days. °· If you had an open biopsy, you may have a hoarse voice or sore throat for a couple days. °· It is your responsibility to get your test results. °This information is not intended to replace advice given to you by your health care provider. Make sure you discuss any questions you have with   your health care provider. °Document Released: 03/28/2007 Document Revised: 02/01/2016 Document Reviewed: 08/23/2013 °Elsevier Interactive Patient Education © 2018 [employer] ° °

## 2017-09-22 ENCOUNTER — Other Ambulatory Visit (HOSPITAL_COMMUNITY): Payer: Self-pay | Admitting: Internal Medicine

## 2017-09-22 ENCOUNTER — Encounter: Payer: Self-pay | Admitting: Gastroenterology

## 2017-09-22 DIAGNOSIS — Z1231 Encounter for screening mammogram for malignant neoplasm of breast: Secondary | ICD-10-CM

## 2017-11-21 ENCOUNTER — Encounter (HOSPITAL_COMMUNITY): Payer: Self-pay

## 2017-11-21 ENCOUNTER — Ambulatory Visit (HOSPITAL_COMMUNITY)
Admission: RE | Admit: 2017-11-21 | Discharge: 2017-11-21 | Disposition: A | Discharge status: Home / Self Care | Payer: Managed Care, Other (non HMO) | Source: Ambulatory Visit | Attending: Internal Medicine | Admitting: Internal Medicine

## 2017-11-21 DIAGNOSIS — Z1231 Encounter for screening mammogram for malignant neoplasm of breast: Secondary | ICD-10-CM | POA: Diagnosis present

## 2017-12-08 ENCOUNTER — Encounter: Payer: Self-pay | Admitting: Nurse Practitioner

## 2017-12-08 ENCOUNTER — Ambulatory Visit (INDEPENDENT_AMBULATORY_CARE_PROVIDER_SITE_OTHER): Payer: Managed Care, Other (non HMO) | Admitting: Nurse Practitioner

## 2017-12-08 ENCOUNTER — Telehealth: Payer: Self-pay

## 2017-12-08 ENCOUNTER — Other Ambulatory Visit: Payer: Self-pay

## 2017-12-08 DIAGNOSIS — Z860100 Personal history of colon polyps, unspecified: Secondary | ICD-10-CM | POA: Insufficient documentation

## 2017-12-08 MED ORDER — PEG 3350-KCL-NA BICARB-NACL 420 G PO SOLR: 4000.0000 mL | ORAL | 0 refills | Status: DC

## 2017-12-08 NOTE — Telephone Encounter (Signed)
Called and informed pt of pre-op appt 02/14/18 at 12:45pm. Letter mailed.

## 2017-12-08 NOTE — Progress Notes (Signed)
cc'd to pcp 

## 2017-12-08 NOTE — Patient Instructions (Signed)
1. We will schedule your colonoscopy for you. 2. As we discussed, please bring your CPAP settings with you to the endoscopy center. 3. Further recommendations will be made after your colonoscopy. 4. Return for follow-up based on recommendations made after your colonoscopy. 5. If you have any GI symptoms or concerns you can call us to schedule an appointment. 6. Let us know if you have any questions.  At [facility] we value your feedback. You may receive a survey about your visit today. Please share your experience as we strive to create trusting relationships with our patients to provide genuine, compassionate, quality care.  It was great to meet you today!  I hope you have a wonderful summer!!

## 2017-12-08 NOTE — Assessment & Plan Note (Signed)
Noted personal history of colon polyps.  Her last colonoscopy was 2014 which had no polyps and recommended 5-year repeat.  Before then she had a colonoscopy in 2013 which apparently found hyperplastic polyps.  The colonoscopy before that was 2012.  Based on previous recommendations from her colonoscopies in [place] she is due for a repeat colonoscopy.  She was previously on sedating meds but is no longer on these.  However, she does have insomnia and has a mixed drink every day.  At this time we will proceed with her colonoscopy.  Proceed with colonoscopy on propofol/MAC with Dr. Oneida Alar in the near future. The risks, benefits, and alternatives have been discussed in detail with the patient. They state understanding and desire to proceed.   The patient is not on any anticoagulants, anxiolytics, chronic pain medications, or antidepressants.  She does drink alcohol daily though not excessive in amount.  She does have insomnia.  She also has sleep apnea.  Given daily alcohol and insomnia we will plan for the procedure on propofol/MAC to promote adequate sedation.

## 2017-12-08 NOTE — Progress Notes (Addendum)
REVIEWED-NO ADDITIONAL RECOMMENDATIONS.  Primary Care Physician:  [name], MD Primary Gastroenterologist:  Dr. Oneida Alar  Chief Complaint  Patient presents with  . Consult    TCS. last done 08/22/2012    HPI:   Megan Church is a 62 y.o. female who presents on referral from primary care to schedule colonoscopy.  Phone/nurse triage was deferred to office visit due to polypharmacy and daily alcohol use.  No history of colonoscopy in our system.  Today she states she's doing well overall. She has had 3 colonoscopies before with history of polyps. She brought records. Last TCS 2014 with no polyps but recommended 5 year repeat due to personal history of polyps. She also had Givens capsule before due to bleeding and states nothing was found. She was given IV iron for history of anemia, no recurrent anemia. Some intermittent nausea which she attributes to new medications. Denies abdominal pain, vomiting, hematochezia, melena, fever, chills, unintentional weight loss. Has been losing weight intentionally due to lifestyle changes in attempt to control diabetes. Stools variable normal to loose, thinks this is related to stress (this is normal for her.) Denies chest pain, dyspnea, dizziness, lightheadedness, syncope, near syncope. Denies any other upper or lower GI symptoms.  Has CPAP.  Past Medical History:  Diagnosis Date  . Arthritis   . Depression   . Diabetes mellitus without complication ([place])    2  . GERD (gastroesophageal reflux disease)   . HLD (hyperlipidemia)   . Hypothyroidism   . RLS (restless legs syndrome)   . Shingles     Past Surgical History:  Procedure Laterality Date  . BACK SURGERY  2012, 2016   lumbar fusion 2012, cleaning out of area 2016  . BREAST BIOPSY Left 2010  . CHOLECYSTECTOMY  2011  . CLOSED REDUCTION ANKLE FRACTURE    . CLOSED REDUCTION HUMERUS FRACTURE Right 2014  . EYE SURGERY  2016   eye lid droop   . KNEE ARTHROSCOPY Right 2010  . TOTAL  KNEE ARTHROPLASTY Right 01/31/2017   Procedure: RIGHT TOTAL KNEE ARTHROPLASTY;  Surgeon: Paralee Cancel, MD;  Location: [facility];  Service: Orthopedics;  Laterality: Right;  90 mins    Current Outpatient Medications  Medication Sig Dispense Refill  . Calcium Carbonate-Vitamin D (CALCIUM 500/VITAMIN D PO) Take 1 tablet by mouth daily with supper.    . Cholecalciferol (VITAMIN D3) 5000 units CAPS Take 5,000 Units by mouth daily.    . Dulaglutide (TRULICITY [place]) Inject into the skin once a week.    . estradiol (ESTRACE) 1 MG tablet Take 1 mg by mouth daily.    . fenofibrate 160 MG tablet Take 160 mg by mouth at bedtime.    . ferrous sulfate (FERROUSUL) 325 (65 FE) MG tablet Take 1 tablet (325 mg total) by mouth 3 (three) times daily with meals.    . furosemide (LASIX) 40 MG tablet Take 40 mg by mouth as needed.     Marland Kitchen levothyroxine (SYNTHROID, LEVOTHROID) 100 MCG tablet Take 120 mcg by mouth daily.   1  . magnesium oxide (MAG-OX) 400 MG tablet Take 400 mg by mouth at bedtime.    . metFORMIN (GLUCOPHAGE) 1000 MG tablet Take 1,000 mg by mouth 2 (two) times daily.  1  . omeprazole (PRILOSEC) 20 MG capsule Take 20 mg by mouth every other day.    . progesterone (PROMETRIUM) 200 MG capsule Take 200 mg by mouth daily.    Marland Kitchen rOPINIRole (REQUIP) 3 MG tablet Take 3 mg  by mouth at bedtime.    . rosuvastatin (CRESTOR) 20 MG tablet Take 20 mg by mouth at bedtime.    . sitaGLIPtin (JANUVIA) 50 MG tablet Take 50 mg by mouth daily.    Marland Kitchen testosterone (ANDROGEL) 50 MG/5GM (1%) GEL Place 5 g onto the skin every other day.    . vitamin B-12 (CYANOCOBALAMIN) 1000 MCG tablet Take 1,000 mcg by mouth daily.     No current facility-administered medications for this visit.     Allergies as of 12/08/2017 - Review Complete 12/08/2017  Allergen Reaction Noted  . Diclofenac sodium Rash 07/24/2016  . Sulfa antibiotics Hives 07/24/2016  . Ace inhibitors Cough 01/20/2017  . Invokana [canagliflozin] Other (See Comments)  01/20/2017  . Vytorin [ezetimibe-simvastatin] Other (See Comments) 01/20/2017  . Atorvastatin Hives 07/24/2016    Family History  Problem Relation Age of Onset  . Colon cancer Neg Hx     Social History   Socioeconomic History  . Marital status: Married    Spouse name: Not on file  . Number of children: Not on file  . Years of education: Not on file  . Highest education level: Not on file  Occupational History  . Not on file  Social Needs  . Financial resource strain: Not on file  . Food insecurity:    Worry: Not on file    Inability: Not on file  . Transportation needs:    Medical: Not on file    Non-medical: Not on file  Tobacco Use  . Smoking status: Former Smoker    Packs/day: 1.00    Years: 8.00    Pack years: 8.00    Types: Cigarettes    Start date: 06/14/1970    Last attempt to quit: 06/14/1978    Years since quitting: 39.5  . Smokeless tobacco: Never Used  . Tobacco comment: quit age 69  Substance and Sexual Activity  . Alcohol use: Yes    Alcohol/week: 4.2 oz    Types: 7 Shots of liquor per week    Comment: mixed drink 1 a day   . Drug use: No  . Sexual activity: Not on file  Lifestyle  . Physical activity:    Days per week: Not on file    Minutes per session: Not on file  . Stress: Not on file  Relationships  . Social connections:    Talks on phone: Not on file    Gets together: Not on file    Attends religious service: Not on file    Active member of club or organization: Not on file    Attends meetings of clubs or organizations: Not on file    Relationship status: Not on file  . Intimate partner violence:    Fear of current or ex partner: Not on file    Emotionally abused: Not on file    Physically abused: Not on file    Forced sexual activity: Not on file  Other Topics Concern  . Not on file  Social History Narrative  . Not on file    Review of Systems: Complete ROS negative except as per HPI.    Physical Exam: BP 133/80   Pulse 97    Temp (!) 97.1 F (36.2 C) (Oral)   Ht 5\' 4"  (1.626 m)   Wt 212 lb (96.2 kg)   BMI 36.39 kg/m  General:   Alert and oriented. Pleasant and cooperative. Well-nourished and well-developed.  Eyes:  Without icterus, sclera clear and conjunctiva pink.  Ears:  Normal auditory acuity. Cardiovascular:  S1, S2 present without murmurs appreciated. Extremities without clubbing or edema. Respiratory:  Clear to auscultation bilaterally. No wheezes, rales, or rhonchi. No distress.  Gastrointestinal:  +BS, soft, non-tender and non-distended. No HSM noted. No guarding or rebound. No masses appreciated.  Rectal:  Deferred  Musculoskalatal:  Symmetrical without gross deformities. Skin:  Intact without significant lesions or rashes. Neurologic:  Alert and oriented x4;  grossly normal neurologically. Psych:  Alert and cooperative. Normal mood and affect. Heme/Lymph/Immune: No excessive bruising noted.    12/08/2017 9:25 AM   Disclaimer: This note was dictated with voice recognition software. Similar sounding words can inadvertently be transcribed and may not be corrected upon review.

## 2018-01-23 ENCOUNTER — Other Ambulatory Visit: Payer: Self-pay | Admitting: Dermatology

## 2018-02-09 NOTE — Patient Instructions (Signed)
Megan Church  02/09/2018     @PREFPERIOPPHARMACY @   Your procedure is scheduled on  02/21/2018.  Report to [facility] at  1200   P.M.  Call this number if you have problems the morning of surgery:  [phone]   Remember:  Do not eat or drink after midnight.  You may drink clear liquids until  (follow the diet and prep instructions given to you by Dr Nona Dell office) .  Clear liquids allowed are:                    Water, Juice (non-citric and without pulp), Carbonated beverages, Clear Tea, Black Coffee only, Plain Jell-O only, Gatorade and Plain Popsicles only    Take these medicines the morning of surgery with A SIP OF WATER  Prilosec, thyroid.    Do not wear jewelry, make-up or nail polish.  Do not wear lotions, powders, or perfumes, or deodorant.  Do not shave 48 hours prior to surgery.  Men may shave face and neck.  Do not bring valuables to the hospital.  [facility] is not responsible for any belongings or valuables.  Contacts, dentures or bridgework may not be worn into surgery.  Leave your suitcase in the car.  After surgery it may be brought to your room.  For patients admitted to the hospital, discharge time will be determined by your treatment team.  Patients discharged the day of surgery will not be allowed to drive home.   Name and phone number of your driver:   family Special instructions:  Follow the diet and prep instructions given to you by Dr Nona Dell office. Please read over the following fact sheets that you were given. Anesthesia Post-op Instructions and Care and Recovery After Surgery       Colonoscopy, Adult A colonoscopy is an exam to look at the large intestine. It is done to check for problems, such as:  Lumps (tumors).  Growths (polyps).  Swelling (inflammation).  Bleeding.  What happens before the procedure? Eating and drinking Follow instructions from your doctor about eating and drinking. These instructions may  include:  A few days before the procedure - follow a low-fiber diet. ? Avoid nuts. ? Avoid seeds. ? Avoid dried fruit. ? Avoid raw fruits. ? Avoid vegetables.  1-3 days before the procedure - follow a clear liquid diet. Avoid liquids that have red or purple dye. Drink only clear liquids, such as: ? Clear broth or bouillon. ? Black coffee or tea. ? Clear juice. ? Clear soft drinks or sports drinks. ? Gelatin dessert. ? Popsicles.  On the day of the procedure - do not eat or drink anything during the 2 hours before the procedure.  Bowel prep If you were prescribed an oral bowel prep:  Take it as told by your doctor. Starting the day before your procedure, you will need to drink a lot of liquid. The liquid will cause you to poop (have bowel movements) until your poop is almost clear or light green.  If your skin or butt gets irritated from diarrhea, you may: ? Wipe the area with wipes that have medicine in them, such as adult wet wipes with aloe and vitamin E. ? Put something on your skin that soothes the area, such as petroleum jelly.  If you throw up (vomit) while drinking the bowel prep, take a break for up to 60 minutes. Then begin the bowel prep again. If  you keep throwing up and you cannot take the bowel prep without throwing up, call your doctor.  General instructions  Ask your doctor about changing or stopping your normal medicines. This is important if you take diabetes medicines or blood thinners.  Plan to have someone take you home from the hospital or clinic. What happens during the procedure?  An IV tube may be put into one of your veins.  You will be given medicine to help you relax (sedative).  To reduce your risk of infection: ? Your doctors will wash their hands. ? Your anal area will be washed with soap.  You will be asked to lie on your side with your knees bent.  Your doctor will get a long, thin, flexible tube ready. The tube will have a camera and a  light on the end.  The tube will be put into your anus.  The tube will be gently put into your large intestine.  Air will be delivered into your large intestine to keep it open. You may feel some pressure or cramping.  The camera will be used to take photos.  A small tissue sample may be removed from your body to be looked at under a microscope (biopsy). If any possible problems are found, the tissue will be sent to a lab for testing.  If small growths are found, your doctor may remove them and have them checked for cancer.  The tube that was put into your anus will be slowly removed. The procedure may vary among doctors and hospitals. What happens after the procedure?  Your doctor will check on you often until the medicines you were given have worn off.  Do not drive for 24 hours after the procedure.  You may have a small amount of blood in your poop.  You may pass gas.  You may have mild cramps or bloating in your belly (abdomen).  It is up to you to get the results of your procedure. Ask your doctor, or the department performing the procedure, when your results will be ready. This information is not intended to replace advice given to you by your health care provider. Make sure you discuss any questions you have with your health care provider. Document Released: 07/03/2010 Document Revised: 03/31/2016 Document Reviewed: 08/12/2015 Elsevier Interactive Patient Education  2017 [employer]  Colonoscopy, Adult, Care After This sheet gives you information about how to care for yourself after your procedure. Your health care provider may also give you more specific instructions. If you have problems or questions, contact your health care provider. What can I expect after the procedure? After the procedure, it is common to have:  A small amount of blood in your stool for 24 hours after the procedure.  Some gas.  Mild abdominal cramping or bloating.  Follow these  instructions at home: General instructions   For the first 24 hours after the procedure: ? Do not drive or use machinery. ? Do not sign important documents. ? Do not drink alcohol. ? Do your regular daily activities at a slower pace than normal. ? Eat soft, easy-to-digest foods. ? Rest often.  Take over-the-counter or prescription medicines only as told by your health care provider.  It is up to you to get the results of your procedure. Ask your health care provider, or the department performing the procedure, when your results will be ready. Relieving cramping and bloating  Try walking around when you have cramps or feel bloated.  Apply heat  to your abdomen as told by your health care provider. Use a heat source that your health care provider recommends, such as a moist heat pack or a heating pad. ? Place a towel between your skin and the heat source. ? Leave the heat on for 20-30 minutes. ? Remove the heat if your skin turns bright red. This is especially important if you are unable to feel pain, heat, or cold. You may have a greater risk of getting burned. Eating and drinking  Drink enough fluid to keep your urine clear or pale yellow.  Resume your normal diet as instructed by your health care provider. Avoid heavy or fried foods that are hard to digest.  Avoid drinking alcohol for as long as instructed by your health care provider. Contact a health care provider if:  You have blood in your stool 2-3 days after the procedure. Get help right away if:  You have more than a small spotting of blood in your stool.  You pass large blood clots in your stool.  Your abdomen is swollen.  You have nausea or vomiting.  You have a fever.  You have increasing abdominal pain that is not relieved with medicine. This information is not intended to replace advice given to you by your health care provider. Make sure you discuss any questions you have with your health care  provider. Document Released: 01/13/2004 Document Revised: 02/23/2016 Document Reviewed: 08/12/2015 Elsevier Interactive Patient Education  2018 [place] Anesthesia is a term that refers to techniques, procedures, and medicines that help a person stay safe and comfortable during a medical procedure. Monitored anesthesia care, or sedation, is one type of anesthesia. Your anesthesia specialist may recommend sedation if you will be having a procedure that does not require you to be unconscious, such as:  Cataract surgery.  A dental procedure.  A biopsy.  A colonoscopy.  During the procedure, you may receive a medicine to help you relax (sedative). There are three levels of sedation:  Mild sedation. At this level, you may feel awake and relaxed. You will be able to follow directions.  Moderate sedation. At this level, you will be sleepy. You may not remember the procedure.  Deep sedation. At this level, you will be asleep. You will not remember the procedure.  The more medicine you are given, the deeper your level of sedation will be. Depending on how you respond to the procedure, the anesthesia specialist may change your level of sedation or the type of anesthesia to fit your needs. An anesthesia specialist will monitor you closely during the procedure. Let your health care provider know about:  Any allergies you have.  All medicines you are taking, including vitamins, herbs, eye drops, creams, and over-the-counter medicines.  Any use of steroids (by mouth or as a cream).  Any problems you or family members have had with sedatives and anesthetic medicines.  Any blood disorders you have.  Any surgeries you have had.  Any medical conditions you have, such as sleep apnea.  Whether you are pregnant or may be pregnant.  Any use of cigarettes, alcohol, or street drugs. What are the risks? Generally, this is a safe procedure. However, problems may  occur, including:  Getting too much medicine (oversedation).  Nausea.  Allergic reaction to medicines.  Trouble breathing. If this happens, a breathing tube may be used to help with breathing. It will be removed when you are awake and breathing on your own.  Heart  trouble.  Lung trouble.  Before the procedure Staying hydrated Follow instructions from your health care provider about hydration, which may include:  Up to 2 hours before the procedure - you may continue to drink clear liquids, such as water, clear fruit juice, black coffee, and plain tea.  Eating and drinking restrictions Follow instructions from your health care provider about eating and drinking, which may include:  8 hours before the procedure - stop eating heavy meals or foods such as meat, fried foods, or fatty foods.  6 hours before the procedure - stop eating light meals or foods, such as toast or cereal.  6 hours before the procedure - stop drinking milk or drinks that contain milk.  2 hours before the procedure - stop drinking clear liquids.  Medicines Ask your health care provider about:  Changing or stopping your regular medicines. This is especially important if you are taking diabetes medicines or blood thinners.  Taking medicines such as aspirin and ibuprofen. These medicines can thin your blood. Do not take these medicines before your procedure if your health care provider instructs you not to.  Tests and exams  You will have a physical exam.  You may have blood tests done to show: ? How well your kidneys and liver are working. ? How well your blood can clot.  General instructions  Plan to have someone take you home from the hospital or clinic.  If you will be going home right after the procedure, plan to have someone with you for 24 hours.  What happens during the procedure?  Your blood pressure, heart rate, breathing, level of pain and overall condition will be monitored.  An IV  tube will be inserted into one of your veins.  Your anesthesia specialist will give you medicines as needed to keep you comfortable during the procedure. This may mean changing the level of sedation.  The procedure will be performed. After the procedure  Your blood pressure, heart rate, breathing rate, and blood oxygen level will be monitored until the medicines you were given have worn off.  Do not drive for 24 hours if you received a sedative.  You may: ? Feel sleepy, clumsy, or nauseous. ? Feel forgetful about what happened after the procedure. ? Have a sore throat if you had a breathing tube during the procedure. ? Vomit. This information is not intended to replace advice given to you by your health care provider. Make sure you discuss any questions you have with your health care provider. Document Released: 02/24/2005 Document Revised: 11/07/2015 Document Reviewed: 09/21/2015 Elsevier Interactive Patient Education  2018 [place], Care After These instructions provide you with information about caring for yourself after your procedure. Your health care provider may also give you more specific instructions. Your treatment has been planned according to current medical practices, but problems sometimes occur. Call your health care provider if you have any problems or questions after your procedure. What can I expect after the procedure? After your procedure, it is common to:  Feel sleepy for several hours.  Feel clumsy and have poor balance for several hours.  Feel forgetful about what happened after the procedure.  Have poor judgment for several hours.  Feel nauseous or vomit.  Have a sore throat if you had a breathing tube during the procedure.  Follow these instructions at home: For at least 24 hours after the procedure:   Do not: ? Participate in activities in which you could fall or  become injured. ? Drive. ? Use heavy  machinery. ? Drink alcohol. ? Take sleeping pills or medicines that cause drowsiness. ? Make important decisions or sign legal documents. ? Take care of children on your own.  Rest. Eating and drinking  Follow the diet that is recommended by your health care provider.  If you vomit, drink water, juice, or soup when you can drink without vomiting.  Make sure you have little or no nausea before eating solid foods. General instructions  Have a responsible adult stay with you until you are awake and alert.  Take over-the-counter and prescription medicines only as told by your health care provider.  If you smoke, do not smoke without supervision.  Keep all follow-up visits as told by your health care provider. This is important. Contact a health care provider if:  You keep feeling nauseous or you keep vomiting.  You feel light-headed.  You develop a rash.  You have a fever. Get help right away if:  You have trouble breathing. This information is not intended to replace advice given to you by your health care provider. Make sure you discuss any questions you have with your health care provider. Document Released: 09/21/2015 Document Revised: 01/21/2016 Document Reviewed: 09/21/2015 Elsevier Interactive Patient Education  [employer].

## 2018-02-14 ENCOUNTER — Encounter (HOSPITAL_COMMUNITY)
Admission: RE | Admit: 2018-02-14 | Discharge: 2018-02-14 | Disposition: A | Discharge status: Home / Self Care | Payer: Managed Care, Other (non HMO) | Source: Ambulatory Visit | Attending: Gastroenterology | Admitting: Gastroenterology

## 2018-02-14 ENCOUNTER — Encounter (HOSPITAL_COMMUNITY): Payer: Self-pay

## 2018-02-14 ENCOUNTER — Other Ambulatory Visit: Payer: Self-pay

## 2018-02-14 DIAGNOSIS — Z01812 Encounter for preprocedural laboratory examination: Secondary | ICD-10-CM | POA: Diagnosis not present

## 2018-02-14 DIAGNOSIS — R000 Tachycardia, unspecified: Secondary | ICD-10-CM | POA: Insufficient documentation

## 2018-02-14 DIAGNOSIS — R9431 Abnormal electrocardiogram [ECG] [EKG]: Secondary | ICD-10-CM | POA: Insufficient documentation

## 2018-02-14 DIAGNOSIS — Z01810 Encounter for preprocedural cardiovascular examination: Secondary | ICD-10-CM | POA: Insufficient documentation

## 2018-02-14 HISTORY — DX: Sleep apnea, unspecified: G47.30

## 2018-02-14 LAB — CBC WITH DIFFERENTIAL/PLATELET
Basophils Absolute: 0 10*3/uL (ref 0.0–0.1)
Basophils Relative: 0 %
Eosinophils Absolute: 0.1 10*3/uL (ref 0.0–0.7)
Eosinophils Relative: 1 %
HCT: 37.8 % (ref 36.0–46.0)
Hemoglobin: 11.9 g/dL — ABNORMAL LOW (ref 12.0–15.0)
Lymphocytes Relative: 42 %
Lymphs Abs: 3.1 10*3/uL (ref 0.7–4.0)
MCH: 27.5 pg (ref 26.0–34.0)
MCHC: 31.5 g/dL (ref 30.0–36.0)
MCV: 87.5 fL (ref 78.0–100.0)
Monocytes Absolute: 0.4 10*3/uL (ref 0.1–1.0)
Monocytes Relative: 5 %
Neutro Abs: 3.8 10*3/uL (ref 1.7–7.7)
Neutrophils Relative %: 52 %
Platelets: 236 10*3/uL (ref 150–400)
RBC: 4.32 MIL/uL (ref 3.87–5.11)
RDW: 15.1 % (ref 11.5–15.5)
WBC: 7.4 10*3/uL (ref 4.0–10.5)

## 2018-02-14 LAB — BASIC METABOLIC PANEL
Anion gap: 12 (ref 5–15)
BUN: 9 mg/dL (ref 8–23)
CO2: 26 mmol/L (ref 22–32)
Calcium: 10.1 mg/dL (ref 8.9–10.3)
Chloride: 96 mmol/L — ABNORMAL LOW (ref 98–111)
Creatinine, Ser: 0.61 mg/dL (ref 0.44–1.00)
GFR calc Af Amer: >60 mL/min (ref >60)
GFR calc non Af Amer: >60 mL/min (ref >60)
Glucose, Bld: 347 mg/dL — ABNORMAL HIGH (ref 70–99)
Potassium: 3.8 mmol/L (ref 3.5–5.1)
Sodium: 134 mmol/L — ABNORMAL LOW (ref 135–145)

## 2018-02-16 NOTE — Progress Notes (Signed)
PT is aware.

## 2018-02-21 ENCOUNTER — Other Ambulatory Visit: Payer: Self-pay

## 2018-02-21 ENCOUNTER — Ambulatory Visit (HOSPITAL_COMMUNITY): Payer: Managed Care, Other (non HMO) | Admitting: Anesthesiology

## 2018-02-21 ENCOUNTER — Encounter (HOSPITAL_COMMUNITY): Payer: Self-pay | Admitting: Gastroenterology

## 2018-02-21 ENCOUNTER — Ambulatory Visit (HOSPITAL_COMMUNITY)
Admission: RE | Admit: 2018-02-21 | Discharge: 2018-02-21 | Disposition: A | Discharge status: Home / Self Care | Payer: Managed Care, Other (non HMO) | Source: Ambulatory Visit | Attending: Gastroenterology | Admitting: Gastroenterology

## 2018-02-21 ENCOUNTER — Encounter (HOSPITAL_COMMUNITY)
Admission: RE | Disposition: A | Discharge status: Home / Self Care | Payer: Self-pay | Source: Ambulatory Visit | Attending: Gastroenterology

## 2018-02-21 DIAGNOSIS — Z888 Allergy status to other drugs, medicaments and biological substances status: Secondary | ICD-10-CM | POA: Insufficient documentation

## 2018-02-21 DIAGNOSIS — Z1211 Encounter for screening for malignant neoplasm of colon: Secondary | ICD-10-CM | POA: Insufficient documentation

## 2018-02-21 DIAGNOSIS — K648 Other hemorrhoids: Secondary | ICD-10-CM | POA: Diagnosis not present

## 2018-02-21 DIAGNOSIS — Z87891 Personal history of nicotine dependence: Secondary | ICD-10-CM | POA: Insufficient documentation

## 2018-02-21 DIAGNOSIS — E039 Hypothyroidism, unspecified: Secondary | ICD-10-CM | POA: Insufficient documentation

## 2018-02-21 DIAGNOSIS — Z86010 Personal history of colonic polyps: Secondary | ICD-10-CM

## 2018-02-21 DIAGNOSIS — G4730 Sleep apnea, unspecified: Secondary | ICD-10-CM | POA: Diagnosis not present

## 2018-02-21 DIAGNOSIS — Z79899 Other long term (current) drug therapy: Secondary | ICD-10-CM | POA: Insufficient documentation

## 2018-02-21 DIAGNOSIS — K5730 Diverticulosis of large intestine without perforation or abscess without bleeding: Secondary | ICD-10-CM | POA: Diagnosis not present

## 2018-02-21 DIAGNOSIS — G2581 Restless legs syndrome: Secondary | ICD-10-CM | POA: Diagnosis not present

## 2018-02-21 DIAGNOSIS — Z9989 Dependence on other enabling machines and devices: Secondary | ICD-10-CM | POA: Diagnosis not present

## 2018-02-21 DIAGNOSIS — K644 Residual hemorrhoidal skin tags: Secondary | ICD-10-CM | POA: Insufficient documentation

## 2018-02-21 DIAGNOSIS — K219 Gastro-esophageal reflux disease without esophagitis: Secondary | ICD-10-CM | POA: Diagnosis not present

## 2018-02-21 DIAGNOSIS — E785 Hyperlipidemia, unspecified: Secondary | ICD-10-CM | POA: Diagnosis not present

## 2018-02-21 DIAGNOSIS — Z882 Allergy status to sulfonamides status: Secondary | ICD-10-CM | POA: Insufficient documentation

## 2018-02-21 DIAGNOSIS — D123 Benign neoplasm of transverse colon: Secondary | ICD-10-CM | POA: Diagnosis not present

## 2018-02-21 DIAGNOSIS — M1990 Unspecified osteoarthritis, unspecified site: Secondary | ICD-10-CM | POA: Diagnosis not present

## 2018-02-21 DIAGNOSIS — F329 Major depressive disorder, single episode, unspecified: Secondary | ICD-10-CM | POA: Insufficient documentation

## 2018-02-21 DIAGNOSIS — E119 Type 2 diabetes mellitus without complications: Secondary | ICD-10-CM | POA: Diagnosis not present

## 2018-02-21 DIAGNOSIS — Z7984 Long term (current) use of oral hypoglycemic drugs: Secondary | ICD-10-CM | POA: Insufficient documentation

## 2018-02-21 HISTORY — PX: COLONOSCOPY WITH PROPOFOL: SHX5780

## 2018-02-21 HISTORY — PX: POLYPECTOMY: SHX5525

## 2018-02-21 LAB — GLUCOSE, CAPILLARY: Glucose-Capillary: 237 mg/dL — ABNORMAL HIGH (ref 70–99)

## 2018-02-21 SURGERY — COLONOSCOPY WITH PROPOFOL
Anesthesia: General

## 2018-02-21 MED ORDER — FENTANYL CITRATE (PF) 100 MCG/2ML IJ SOLN: 25.0000 ug | INTRAMUSCULAR | Status: DC | PRN

## 2018-02-21 MED ORDER — LACTATED RINGERS IV SOLN: INTRAVENOUS | Status: DC

## 2018-02-21 MED ORDER — PROPOFOL 10 MG/ML IV BOLUS
INTRAVENOUS | Status: AC
  Filled 2018-02-21: qty 40

## 2018-02-21 MED ORDER — PROPOFOL 500 MG/50ML IV EMUL
INTRAVENOUS | Status: DC | PRN
  Administered 2018-02-21: 200 ug/kg/min via INTRAVENOUS
  Administered 2018-02-21: 12:00:00 via INTRAVENOUS

## 2018-02-21 MED ORDER — CHLORHEXIDINE GLUCONATE CLOTH 2 % EX PADS: 6.0000 | MEDICATED_PAD | Freq: Once | CUTANEOUS | Status: DC

## 2018-02-21 MED ORDER — PROPOFOL 10 MG/ML IV BOLUS
INTRAVENOUS | Status: DC | PRN
  Administered 2018-02-21 (×4): 20 mg via INTRAVENOUS

## 2018-02-21 MED ORDER — LACTATED RINGERS IV SOLN
INTRAVENOUS | Status: DC | PRN
  Administered 2018-02-21: 11:00:00 via INTRAVENOUS

## 2018-02-21 MED ORDER — STERILE WATER FOR IRRIGATION IR SOLN
Status: DC | PRN
  Administered 2018-02-21: 100 mL

## 2018-02-21 MED ORDER — HYDROCODONE-ACETAMINOPHEN 7.5-325 MG PO TABS: 1.0000 | ORAL_TABLET | Freq: Once | ORAL | Status: DC | PRN

## 2018-02-21 NOTE — H&P (Signed)
Primary Care Physician:  [name], MD Primary Gastroenterologist:  Dr. Oneida Alar  Pre-Procedure History & Physical: HPI:  Megan Church is a 62 y.o. female here for  PERSONAL HISTORY OF POLYPS.  Past Medical History:  Diagnosis Date  . Arthritis   . Depression   . Diabetes mellitus without complication ([place])    2  . GERD (gastroesophageal reflux disease)   . HLD (hyperlipidemia)   . Hypothyroidism   . RLS (restless legs syndrome)   . Shingles   . Sleep apnea     Past Surgical History:  Procedure Laterality Date  . BACK SURGERY  2012, 2016   lumbar fusion 2012, cleaning out of area 2016  . BREAST BIOPSY Left 2010  . CHOLECYSTECTOMY  2011  . CLOSED REDUCTION ANKLE FRACTURE    . CLOSED REDUCTION HUMERUS FRACTURE Right 2014  . EYE SURGERY  2016   eye lid droop   . KNEE ARTHROSCOPY Right 2010  . TOTAL KNEE ARTHROPLASTY Right 01/31/2017   Procedure: RIGHT TOTAL KNEE ARTHROPLASTY;  Surgeon: Paralee Cancel, MD;  Location: [facility];  Service: Orthopedics;  Laterality: Right;  90 mins    Prior to Admission medications   Medication Sig Start Date End Date Taking? Authorizing Provider  Calcium Carb-Cholecalciferol (CALCIUM 600/VITAMIN D3 PO) Take 1 tablet by mouth daily.   Yes [provider]  Dulaglutide (TRULICITY) 1.5 UM/3.5TI SOPN Inject 1.5 mg into the skin every Sunday.    Yes [provider]  estradiol (ESTRACE) 1 MG tablet Take 1 mg by mouth daily.   Yes [provider]  furosemide (LASIX) 40 MG tablet Take 40 mg by mouth every other day.    Yes [provider]  Magnesium 500 MG TABS Take 500 mg by mouth every evening.   Yes [provider]  Melatonin 1 MG CAPS Take 1 mg by mouth at bedtime as needed (for sleep).   Yes [provider]  metFORMIN (GLUCOPHAGE) 1000 MG tablet Take 1,000 mg by mouth 2 (two) times daily. 11/30/16  Yes [provider]  NON FORMULARY Apply 1 application topically every 3 (three)  days. Testosterone Compound Cream   Yes [provider]  omeprazole (PRILOSEC) 20 MG capsule Take 20 mg by mouth every other day.   Yes [provider]  progesterone (PROMETRIUM) 200 MG capsule Take 200 mg by mouth at bedtime.    Yes [provider]  rOPINIRole (REQUIP) 3 MG tablet Take 3 mg by mouth at bedtime.   Yes [provider]  rosuvastatin (CRESTOR) 20 MG tablet Take 20 mg by mouth at bedtime.   Yes [provider]  sitaGLIPtin (JANUVIA) 50 MG tablet Take 50 mg by mouth daily.   Yes [provider]  thyroid (NP THYROID) 120 MG tablet Take 120 mg by mouth daily before breakfast.   Yes [provider]  vitamin B-12 (CYANOCOBALAMIN) 1000 MCG tablet Take 1,000 mcg by mouth daily.   Yes [provider]  ferrous sulfate (FERROUSUL) 325 (65 FE) MG tablet Take 1 tablet (325 mg total) by mouth 3 (three) times daily with meals. Patient not taking: Reported on 02/07/2018 01/31/17   Danae Orleans, PA-C  polyethylene glycol-electrolytes (TRILYTE) 420 g solution Take 4,000 mLs by mouth as directed. 12/08/17   Danie Binder, MD    Allergies as of 12/08/2017 - Review Complete 12/08/2017  Allergen Reaction Noted  . Diclofenac sodium Rash 07/24/2016  . Sulfa antibiotics Hives 07/24/2016  . Ace inhibitors Cough  01/20/2017  . Invokana [canagliflozin] Other (See Comments) 01/20/2017  . Vytorin [ezetimibe-simvastatin] Other (See Comments) 01/20/2017  . Atorvastatin Hives 07/24/2016    Family History  Problem Relation Age of Onset  . Colon cancer Neg Hx     Social History   Socioeconomic History  . Marital status: Married    Spouse name: Not on file  . Number of children: Not on file  . Years of education: Not on file  . Highest education level: Not on file  Occupational History  . Not on file  Social Needs  . Financial resource strain: Not on file  . Food insecurity:    Worry: Not on file    Inability: Not on file   . Transportation needs:    Medical: Not on file    Non-medical: Not on file  Tobacco Use  . Smoking status: Former Smoker    Packs/day: 1.00    Years: 8.00    Pack years: 8.00    Types: Cigarettes    Start date: 06/14/1970    Last attempt to quit: 06/14/1978    Years since quitting: 39.7  . Smokeless tobacco: Never Used  . Tobacco comment: quit age 53  Substance and Sexual Activity  . Alcohol use: Yes    Alcohol/week: 7.0 standard drinks    Types: 7 Shots of liquor per week    Comment: mixed drink 1 a day   . Drug use: No  . Sexual activity: Not on file  Lifestyle  . Physical activity:    Days per week: Not on file    Minutes per session: Not on file  . Stress: Not on file  Relationships  . Social connections:    Talks on phone: Not on file    Gets together: Not on file    Attends religious service: Not on file    Active member of club or organization: Not on file    Attends meetings of clubs or organizations: Not on file    Relationship status: Not on file  . Intimate partner violence:    Fear of current or ex partner: Not on file    Emotionally abused: Not on file    Physically abused: Not on file    Forced sexual activity: Not on file  Other Topics Concern  . Not on file  Social History Narrative  . Not on file    Review of Systems: See HPI, otherwise negative ROS   Physical Exam: There were no vitals taken for this visit. General:   Alert,  pleasant and cooperative in NAD Head:  Normocephalic and atraumatic. Neck:  Supple; Lungs:  Clear throughout to auscultation.    Heart:  Regular rate and rhythm. Abdomen:  Soft, nontender and nondistended. Normal bowel sounds, without guarding, and without rebound.   Neurologic:  Alert and  oriented x4;  grossly normal neurologically.  Impression/Plan:     PERSONAL HISTORY OF POLYPS.  PLAN: 1. TCS TODAY. DISCUSSED PROCEDURE, BENEFITS, & RISKS: < 1% chance of medication reaction, bleeding, perforation, or rupture  of spleen/liver.

## 2018-02-21 NOTE — Anesthesia Postprocedure Evaluation (Signed)
Anesthesia Post Note  Patient: Megan Church  Procedure(s) Performed: COLONOSCOPY WITH PROPOFOL (N/A ) POLYPECTOMY  Patient location during evaluation: PACU Anesthesia Type: General Level of consciousness: awake and alert and oriented Pain management: pain level controlled Vital Signs Assessment: post-procedure vital signs reviewed and stable Respiratory status: spontaneous breathing Cardiovascular status: stable and blood pressure returned to baseline Postop Assessment: no apparent nausea or vomiting Anesthetic complications: no     Last Vitals:  Vitals:   02/21/18 1049 02/21/18 1206  BP: 124/85   Pulse: 95 (P) 96  Resp: 17   Temp: 36.8 C (P) 36.8 C  SpO2: 93% (P) 94%    Last Pain:  Vitals:   02/21/18 1206  TempSrc:   PainSc: (P) 0-No pain                 Adarian Bur

## 2018-02-21 NOTE — Discharge Instructions (Signed)
You have MODERATE internal AND EXTERNAL hemorrhoids and diverticulosis IN YOUR LEFT AND RIGHT COLON. YOU HAD ONE POLYP REMOVED.    DRINK WATER TO KEEP YOUR URINE LIGHT YELLOW.  CONTINUE YOUR WEIGHT LOSS EFFORTS. YOUR BODY MASS INDEX IS OVER 30 WHICH MEANS YOU ARE OBESE. OBESITY IS ASSOCIATED WITH AN INCREASE FOR ALL CANCERS, INCLUDING ESOPHAGEAL AND COLON CANCER.  FOLLOW A HIGH FIBER DIET. AVOID ITEMS THAT CAUSE BLOATING. See info below.  YOUR BIOPSY RESULTS WILL BE AVAILABLE IN 7 DAYS.  USE PREPARATION H FOUR TIMES  A DAY IF NEEDED TO RELIEVE RECTAL PAIN/PRESSURE/BLEEDING.  Next colonoscopy in 5 years.  Colonoscopy Care After Read the instructions outlined below and refer to this sheet in the next week. These discharge instructions provide you with general information on caring for yourself after you leave the hospital. While your treatment has been planned according to the most current medical practices available, unavoidable complications occasionally occur. If you have any problems or questions after discharge, call DR. FIELDS, [phone].  ACTIVITY  You may resume your regular activity, but move at a slower pace for the next 24 hours.   Take frequent rest periods for the next 24 hours.   Walking will help get rid of the air and reduce the bloated feeling in your belly (abdomen).   No driving for 24 hours (because of the medicine (anesthesia) used during the test).   You may shower.   Do not sign any important legal documents or operate any machinery for 24 hours (because of the anesthesia used during the test).    NUTRITION  Drink plenty of fluids.   You may resume your normal diet as instructed by your doctor.   Begin with a light meal and progress to your normal diet. Heavy or fried foods are harder to digest and may make you feel sick to your stomach (nauseated).   Avoid alcoholic beverages for 24 hours or as instructed.    MEDICATIONS  You may resume your  normal medications.   WHAT YOU CAN EXPECT TODAY  Some feelings of bloating in the abdomen.   Passage of more gas than usual.   Spotting of blood in your stool or on the toilet paper  .  IF YOU HAD POLYPS REMOVED DURING THE COLONOSCOPY:  Eat a soft diet IF YOU HAVE NAUSEA, BLOATING, ABDOMINAL PAIN, OR VOMITING.    FINDING OUT THE RESULTS OF YOUR TEST Not all test results are available during your visit. DR. Oneida Alar WILL CALL YOU WITHIN 14 DAYS OF YOUR PROCEDUE WITH YOUR RESULTS. Do not assume everything is normal if you have not heard from DR. FIELDS, CALL HER OFFICE AT [phone].  SEEK IMMEDIATE MEDICAL ATTENTION AND CALL THE OFFICE: [phone] IF:  You have more than a spotting of blood in your stool.   Your belly is swollen (abdominal distention).   You are nauseated or vomiting.   You have a temperature over 101F.   You have abdominal pain or discomfort that is severe or gets worse throughout the day.  High-Fiber Diet A high-fiber diet changes your normal diet to include more whole grains, legumes, fruits, and vegetables. Changes in the diet involve replacing refined carbohydrates with unrefined foods. The calorie level of the diet is essentially unchanged. The Dietary Reference Intake (recommended amount) for adult males is 38 grams per day. For adult females, it is 25 grams per day. Pregnant and lactating women should consume 28 grams of fiber per day. Fiber is the intact part  of a plant that is not broken down during digestion. Functional fiber is fiber that has been isolated from the plant to provide a beneficial effect in the body.  PURPOSE  Increase stool bulk.   Ease and regulate bowel movements.   Lower cholesterol.   REDUCE RISK OF COLON CANCER  INDICATIONS THAT YOU NEED MORE FIBER  Constipation and hemorrhoids.   Uncomplicated diverticulosis (intestine condition) and irritable bowel syndrome.   Weight management.   As a protective measure against  hardening of the arteries (atherosclerosis), diabetes, and cancer.   GUIDELINES FOR INCREASING FIBER IN THE DIET  Start adding fiber to the diet slowly. A gradual increase of about 5 more grams (2 slices of whole-wheat bread, 2 servings of most fruits or vegetables, or 1 bowl of high-fiber cereal) per day is best. Too rapid an increase in fiber may result in constipation, flatulence, and bloating.   Drink enough water and fluids to keep your urine clear or pale yellow. Water, juice, or caffeine-free drinks are recommended. Not drinking enough fluid may cause constipation.   Eat a variety of high-fiber foods rather than one type of fiber.   Try to increase your intake of fiber through using high-fiber foods rather than fiber pills or supplements that contain small amounts of fiber.   The goal is to change the types of food eaten. Do not supplement your present diet with high-fiber foods, but replace foods in your present diet.   INCLUDE A VARIETY OF FIBER SOURCES  Replace refined and processed grains with whole grains, canned fruits with fresh fruits, and incorporate other fiber sources. White rice, white breads, and most bakery goods contain little or no fiber.   Brown whole-grain rice, buckwheat oats, and many fruits and vegetables are all good sources of fiber. These include: broccoli, Brussels sprouts, cabbage, cauliflower, beets, sweet potatoes, white potatoes (skin on), carrots, tomatoes, eggplant, squash, berries, fresh fruits, and dried fruits.   Cereals appear to be the richest source of fiber. Cereal fiber is found in whole grains and bran. Bran is the fiber-rich outer coat of cereal grain, which is largely removed in refining. In whole-grain cereals, the bran remains. In breakfast cereals, the largest amount of fiber is found in those with "bran" in their names. The fiber content is sometimes indicated on the label.   You may need to include additional fruits and vegetables each day.     In baking, for 1 cup white flour, you may use the following substitutions:   1 cup whole-wheat flour minus 2 tablespoons.   1/2 cup white flour plus 1/2 cup whole-wheat flour.   Polyps, Colon  A polyp is extra tissue that grows inside your body. Colon polyps grow in the large intestine. The large intestine, also called the colon, is part of your digestive system. It is a long, hollow tube at the end of your digestive tract where your body makes and stores stool. Most polyps are not dangerous. They are benign. This means they are not cancerous. But over time, some types of polyps can turn into cancer. Polyps that are smaller than a pea are usually not harmful. But larger polyps could someday become or may already be cancerous. To be safe, doctors remove all polyps and test them.   PREVENTION There is not one sure way to prevent polyps. You might be able to lower your risk of getting them if you:  Eat more fruits and vegetables and less fatty food.   Do not  smoke.   Avoid alcohol.   Exercise every day.   Lose weight if you are overweight.   Eating more calcium and folate can also lower your risk of getting polyps. Some foods that are rich in calcium are milk, cheese, and broccoli. Some foods that are rich in folate are chickpeas, kidney beans, and spinach.    Diverticulosis Diverticulosis is a common condition that develops when small pouches (diverticula) form in the wall of the colon. The risk of diverticulosis increases with age. It happens more often in people who eat a low-fiber diet. Most individuals with diverticulosis have no symptoms. Those individuals with symptoms usually experience belly (abdominal) pain, constipation, or loose stools (diarrhea).  HOME CARE INSTRUCTIONS  Increase the amount of fiber in your diet as directed by your caregiver or dietician. This may reduce symptoms of diverticulosis.   Drink at least 6 to 8 glasses of water each day to prevent  constipation.   Try not to strain when you have a bowel movement.   Avoiding nuts and seeds to prevent complications is NOT NECESSARY.   FOODS HAVING HIGH FIBER CONTENT INCLUDE:  Fruits. Apple, peach, pear, tangerine, raisins, prunes.   Vegetables. Brussels sprouts, asparagus, broccoli, cabbage, carrot, cauliflower, romaine lettuce, spinach, summer squash, tomato, winter squash, zucchini.   Starchy Vegetables. Baked beans, kidney beans, lima beans, split peas, lentils, potatoes (with skin).   Grains. Whole wheat bread, brown rice, bran flake cereal, plain oatmeal, white rice, shredded wheat, bran muffins.   SEEK IMMEDIATE MEDICAL CARE IF:  You develop increasing pain or severe bloating.   You have an oral temperature above 101F.   You develop vomiting or bowel movements that are bloody or black.   Hemorrhoids Hemorrhoids are dilated (enlarged) veins around the rectum. Sometimes clots will form in the veins. This makes them swollen and painful. These are called thrombosed hemorrhoids. Causes of hemorrhoids include:  Constipation.   Straining to have a bowel movement.   HEAVY LIFTING   HOME CARE INSTRUCTIONS  Eat a well balanced diet and drink 6 to 8 glasses of water every day to avoid constipation. You may also use a bulk laxative.   Avoid straining to have bowel movements.   Keep anal area dry and clean.   Do not use a donut shaped pillow or sit on the toilet for long periods. This increases blood pooling and pain.   Move your bowels when your body has the urge; this will require less straining and will decrease pain and pressure.

## 2018-02-21 NOTE — Anesthesia Preprocedure Evaluation (Addendum)
Anesthesia Evaluation  Patient identified by MRN, date of birth, ID band Patient awake    Reviewed: Allergy & Precautions, NPO status , Patient's Chart, lab work & pertinent test results  Airway Mallampati: II  TM Distance: >3 FB Neck ROM: Full    Dental no notable dental hx. (+) Teeth Intact   Pulmonary neg pulmonary ROS, sleep apnea and Continuous Positive Airway Pressure Ventilation , former smoker,    Pulmonary exam normal breath sounds clear to auscultation       Cardiovascular Exercise Tolerance: Good negative cardio ROS Normal cardiovascular examI Rhythm:Regular Rate:Normal     Neuro/Psych Depression negative neurological ROS  negative psych ROS   GI/Hepatic negative GI ROS, Neg liver ROS, GERD  Medicated and Controlled,  Endo/Other  negative endocrine ROSdiabetes, Type 2, Oral Hypoglycemic AgentsHypothyroidism   Renal/GU negative Renal ROS  negative genitourinary   Musculoskeletal negative musculoskeletal ROS (+) Arthritis , Osteoarthritis,    Abdominal   Peds negative pediatric ROS (+)  Hematology negative hematology ROS (+)   Anesthesia Other Findings   Reproductive/Obstetrics negative OB ROS                             Anesthesia Physical Anesthesia Plan  ASA: II  Anesthesia Plan: General   Post-op Pain Management:    Induction: Intravenous  PONV Risk Score and Plan:   Airway Management Planned: Nasal Cannula and Simple Face Mask  Additional Equipment:   Intra-op Plan:   Post-operative Plan:   Informed Consent: I have reviewed the patients History and Physical, chart, labs and discussed the procedure including the risks, benefits and alternatives for the proposed anesthesia with the patient or authorized representative who has indicated his/her understanding and acceptance.   Dental advisory given  Plan Discussed with: CRNA  Anesthesia Plan Comments:          Anesthesia Quick Evaluation

## 2018-02-21 NOTE — Transfer of Care (Signed)
Immediate Anesthesia Transfer of Care Note  Patient: Megan Church  Procedure(s) Performed: COLONOSCOPY WITH PROPOFOL (N/A ) POLYPECTOMY  Patient Location: PACU  Anesthesia Type:MAC  Level of Consciousness: awake  Airway & Oxygen Therapy: Patient Spontanous Breathing  Post-op Assessment: Report given to RN  Post vital signs: Reviewed and stable  Last Vitals:  Vitals Value Taken Time  BP 77/53 02/21/2018 12:06 PM  Temp    Pulse 91 02/21/2018 12:09 PM  Resp 20 02/21/2018 12:09 PM  SpO2 94 % 02/21/2018 12:09 PM  Vitals shown include unvalidated device data.  Last Pain:  Vitals:   02/21/18 1206  TempSrc:   PainSc: (P) 0-No pain         Complications: No apparent anesthesia complications

## 2018-02-21 NOTE — Op Note (Signed)
[facility] Patient Name: Megan Church Procedure Date: 02/21/2018 12:08 PM MRN: [id number] Date of Birth: Jul 13, 1955 Attending MD: Barney Drain MD, MD CSN: [id number] Age: 62 Admit Type: Outpatient Procedure:                Colonoscopy WITH COLD SNARE POLYPECTOMY Indications:              Personal history of colonic polyps Providers:                Barney Drain MD, MD Referring MD:             [name] MD, MD Medicines:                Propofol per Anesthesia Complications:            No immediate complications. Estimated Blood Loss:     Estimated blood loss was minimal. Procedure:                Pre-Anesthesia Assessment:                           - Prior to the procedure, a History and Physical                            was performed, and patient medications and                            allergies were reviewed. The patient's tolerance of                            previous anesthesia was also reviewed. The risks                            and benefits of the procedure and the sedation                            options and risks were discussed with the patient.                            All questions were answered, and informed consent                            was obtained. Prior Anticoagulants: The patient has                            taken no previous anticoagulant or antiplatelet                            agents. ASA Grade Assessment: II - A patient with                            mild systemic disease. After reviewing the risks                            and benefits, the patient was deemed in  satisfactory condition to undergo the procedure.                            After obtaining informed consent, the colonoscope                            was passed under direct vision. Throughout the                            procedure, the patient's blood pressure, pulse, and                            oxygen saturations were monitored  continuously. The                            CF-[id number] ([id number]) scope was introduced through                            the anus and advanced to the the cecum, identified                            by appendiceal orifice and ileocecal valve. The                            ileocecal valve, appendiceal orifice, and rectum                            were photographed. The colonoscopy was somewhat                            difficult due to a redundant colon. Successful                            completion of the procedure was aided by                            straightening and shortening the scope to obtain                            bowel loop reduction and COLOWRAP. The patient                            tolerated the procedure well. The quality of the                            bowel preparation was good. Findings:      A 5 mm polyp was found in the hepatic flexure. The polyp was sessile.       The polyp was removed with a cold snare. Resection and retrieval were       complete.      Multiple small and large-mouthed diverticula were found in the entire       colon.      The recto-sigmoid colon and sigmoid colon were mildly redundant.      External and  internal hemorrhoids were found. The hemorrhoids were       moderate. Impression:               - One 5 mm polyp at the hepatic flexure, removed                            with a cold snare. Resected and retrieved.                           - MODERATE Diverticulosis in the entire examined                            colon.                           - Redundant LEFT colon.                           - External and internal hemorrhoids. Moderate Sedation:      Per Anesthesia Care Recommendation:           - Patient has a contact number available for                            emergencies. The signs and symptoms of potential                            delayed complications were discussed with the                            patient. Return  to normal activities tomorrow.                            Written discharge instructions were provided to the                            patient.                           - High fiber diet.                           - Continue present medications.                           - Await pathology results.                           - Repeat colonoscopy in 5-10 years for surveillance. Procedure Code(s):        --- Professional ---                           [phone], Colonoscopy, flexible; with removal of                            tumor(s), polyp(s), or other lesion(s) by snare  technique Diagnosis Code(s):        --- Professional ---                           D12.3, Benign neoplasm of transverse colon (hepatic                            flexure or splenic flexure)                           K64.8, Other hemorrhoids                           Z86.010, Personal history of colonic polyps                           K57.30, Diverticulosis of large intestine without                            perforation or abscess without bleeding                           Q43.8, Other specified congenital malformations of                            intestine CPT copyright 2017 [employer]. All rights reserved. The codes documented in this report are preliminary and upon coder review may  be revised to meet current compliance requirements. Barney Drain, MD Barney Drain MD, MD 02/21/2018 12:15:41 PM This report has been signed electronically. Number of Addenda: 0

## 2018-02-27 ENCOUNTER — Encounter (HOSPITAL_COMMUNITY): Payer: Self-pay | Admitting: Gastroenterology

## 2018-03-01 NOTE — Progress Notes (Signed)
Pt is aware.  

## 2018-03-02 ENCOUNTER — Other Ambulatory Visit (HOSPITAL_COMMUNITY): Payer: Self-pay | Admitting: Internal Medicine

## 2018-03-02 DIAGNOSIS — R7989 Other specified abnormal findings of blood chemistry: Secondary | ICD-10-CM

## 2018-03-02 DIAGNOSIS — R945 Abnormal results of liver function studies: Secondary | ICD-10-CM

## 2018-03-02 NOTE — Progress Notes (Signed)
CC'D TO PCP AND ON RECALL  °

## 2018-03-09 ENCOUNTER — Ambulatory Visit (HOSPITAL_COMMUNITY)
Admission: RE | Admit: 2018-03-09 | Discharge: 2018-03-09 | Disposition: A | Discharge status: Home / Self Care | Payer: Managed Care, Other (non HMO) | Source: Ambulatory Visit | Attending: Internal Medicine | Admitting: Internal Medicine

## 2018-03-09 ENCOUNTER — Ambulatory Visit (HOSPITAL_COMMUNITY): Payer: Managed Care, Other (non HMO)

## 2018-03-09 DIAGNOSIS — K760 Fatty (change of) liver, not elsewhere classified: Secondary | ICD-10-CM | POA: Insufficient documentation

## 2018-03-09 DIAGNOSIS — Z9049 Acquired absence of other specified parts of digestive tract: Secondary | ICD-10-CM | POA: Diagnosis not present

## 2018-03-09 DIAGNOSIS — R7989 Other specified abnormal findings of blood chemistry: Secondary | ICD-10-CM

## 2018-03-09 DIAGNOSIS — R945 Abnormal results of liver function studies: Secondary | ICD-10-CM | POA: Insufficient documentation

## 2018-04-12 ENCOUNTER — Ambulatory Visit (HOSPITAL_COMMUNITY)
Admission: RE | Admit: 2018-04-12 | Discharge: 2018-04-12 | Disposition: A | Discharge status: Home / Self Care | Payer: Managed Care, Other (non HMO) | Source: Ambulatory Visit | Attending: Internal Medicine | Admitting: Internal Medicine

## 2018-04-12 ENCOUNTER — Other Ambulatory Visit (HOSPITAL_COMMUNITY): Payer: Self-pay | Admitting: Internal Medicine

## 2018-04-12 DIAGNOSIS — R053 Chronic cough: Secondary | ICD-10-CM

## 2018-04-12 DIAGNOSIS — R05 Cough: Secondary | ICD-10-CM

## 2018-08-01 DIAGNOSIS — M4310 Spondylolisthesis, site unspecified: Secondary | ICD-10-CM | POA: Insufficient documentation

## 2018-08-14 ENCOUNTER — Ambulatory Visit: Payer: Self-pay | Admitting: Orthopedic Surgery

## 2018-08-17 ENCOUNTER — Other Ambulatory Visit: Payer: Self-pay | Admitting: Orthopedic Surgery

## 2018-08-23 ENCOUNTER — Other Ambulatory Visit: Payer: Self-pay | Admitting: Orthopedic Surgery

## 2018-08-23 DIAGNOSIS — M5136 Other intervertebral disc degeneration, lumbar region: Secondary | ICD-10-CM

## 2018-09-04 ENCOUNTER — Other Ambulatory Visit: Payer: Managed Care, Other (non HMO)

## 2018-09-28 ENCOUNTER — Inpatient Hospital Stay: Admit: 2018-09-28 | Payer: Managed Care, Other (non HMO) | Admitting: Orthopedic Surgery

## 2018-09-28 SURGERY — ANTERIOR LATERAL LUMBAR FUSION 1 LEVEL
Anesthesia: General

## 2018-12-04 ENCOUNTER — Other Ambulatory Visit (HOSPITAL_COMMUNITY): Payer: Self-pay | Admitting: Internal Medicine

## 2018-12-04 DIAGNOSIS — Z1231 Encounter for screening mammogram for malignant neoplasm of breast: Secondary | ICD-10-CM

## 2018-12-08 ENCOUNTER — Other Ambulatory Visit: Payer: Self-pay

## 2018-12-08 ENCOUNTER — Ambulatory Visit (HOSPITAL_COMMUNITY)
Admission: RE | Admit: 2018-12-08 | Discharge: 2018-12-08 | Disposition: A | Discharge status: Home / Self Care | Payer: Managed Care, Other (non HMO) | Source: Ambulatory Visit | Attending: Internal Medicine | Admitting: Internal Medicine

## 2018-12-08 DIAGNOSIS — Z1231 Encounter for screening mammogram for malignant neoplasm of breast: Secondary | ICD-10-CM | POA: Diagnosis present

## 2019-01-05 ENCOUNTER — Other Ambulatory Visit: Payer: Self-pay

## 2019-01-05 ENCOUNTER — Encounter (HOSPITAL_COMMUNITY)
Admission: RE | Admit: 2019-01-05 | Discharge: 2019-01-05 | Disposition: A | Discharge status: Home / Self Care | Payer: Managed Care, Other (non HMO) | Source: Ambulatory Visit | Attending: Internal Medicine | Admitting: Internal Medicine

## 2019-01-05 DIAGNOSIS — D649 Anemia, unspecified: Secondary | ICD-10-CM | POA: Insufficient documentation

## 2019-01-05 MED ORDER — SODIUM CHLORIDE 0.9 % IV SOLN
510.0000 mg | Freq: Once | INTRAVENOUS | Status: AC
  Administered 2019-01-05: 14:00:00 510 mg via INTRAVENOUS
  Filled 2019-01-05: qty 510

## 2019-01-05 MED ORDER — SODIUM CHLORIDE 0.9 % IV SOLN
Freq: Once | INTRAVENOUS | Status: AC
  Administered 2019-01-05: 250 mL via INTRAVENOUS

## 2019-01-12 ENCOUNTER — Encounter (HOSPITAL_COMMUNITY): Payer: Self-pay

## 2019-01-12 ENCOUNTER — Other Ambulatory Visit: Payer: Self-pay

## 2019-01-12 ENCOUNTER — Encounter (HOSPITAL_COMMUNITY)
Admission: RE | Admit: 2019-01-12 | Discharge: 2019-01-12 | Disposition: A | Discharge status: Home / Self Care | Payer: Managed Care, Other (non HMO) | Source: Ambulatory Visit | Attending: Internal Medicine | Admitting: Internal Medicine

## 2019-01-12 DIAGNOSIS — D649 Anemia, unspecified: Secondary | ICD-10-CM | POA: Diagnosis not present

## 2019-01-12 MED ORDER — SODIUM CHLORIDE 0.9 % IV SOLN
510.0000 mg | Freq: Once | INTRAVENOUS | Status: AC
  Administered 2019-01-12: 510 mg via INTRAVENOUS
  Filled 2019-01-12: qty 17

## 2019-01-12 MED ORDER — SODIUM CHLORIDE 0.9 % IV SOLN
Freq: Once | INTRAVENOUS | Status: AC
  Administered 2019-01-12: 11:00:00 via INTRAVENOUS

## 2019-02-20 ENCOUNTER — Other Ambulatory Visit: Payer: Self-pay

## 2019-02-20 ENCOUNTER — Encounter (INDEPENDENT_AMBULATORY_CARE_PROVIDER_SITE_OTHER): Payer: Self-pay | Admitting: Internal Medicine

## 2019-02-20 ENCOUNTER — Ambulatory Visit (INDEPENDENT_AMBULATORY_CARE_PROVIDER_SITE_OTHER): Payer: Managed Care, Other (non HMO) | Admitting: Internal Medicine

## 2019-02-20 VITALS — BP 130/70 | HR 72 | Ht 64.0 in | Wt 208.8 lb

## 2019-02-20 DIAGNOSIS — E039 Hypothyroidism, unspecified: Secondary | ICD-10-CM | POA: Diagnosis not present

## 2019-02-20 DIAGNOSIS — E1165 Type 2 diabetes mellitus with hyperglycemia: Secondary | ICD-10-CM

## 2019-02-20 DIAGNOSIS — E559 Vitamin D deficiency, unspecified: Secondary | ICD-10-CM | POA: Diagnosis not present

## 2019-02-20 DIAGNOSIS — E785 Hyperlipidemia, unspecified: Secondary | ICD-10-CM

## 2019-02-20 DIAGNOSIS — E782 Mixed hyperlipidemia: Secondary | ICD-10-CM

## 2019-02-20 DIAGNOSIS — IMO0002 Reserved for concepts with insufficient information to code with codable children: Secondary | ICD-10-CM

## 2019-02-20 DIAGNOSIS — E119 Type 2 diabetes mellitus without complications: Secondary | ICD-10-CM | POA: Diagnosis not present

## 2019-02-20 DIAGNOSIS — K219 Gastro-esophageal reflux disease without esophagitis: Secondary | ICD-10-CM

## 2019-02-20 DIAGNOSIS — D649 Anemia, unspecified: Secondary | ICD-10-CM

## 2019-02-20 HISTORY — DX: Hyperlipidemia, unspecified: E78.5

## 2019-02-20 HISTORY — DX: Reserved for concepts with insufficient information to code with codable children: IMO0002

## 2019-02-20 HISTORY — DX: Hypothyroidism, unspecified: E03.9

## 2019-02-20 HISTORY — DX: Type 2 diabetes mellitus with hyperglycemia: E11.65

## 2019-02-20 HISTORY — DX: Morbid (severe) obesity due to excess calories: E66.01

## 2019-02-20 HISTORY — DX: Vitamin D deficiency, unspecified: E55.9

## 2019-02-20 MED ORDER — INSULIN DETEMIR 100 UNIT/ML FLEXPEN: 40.0000 [IU] | PEN_INJECTOR | Freq: Every day | SUBCUTANEOUS | 1 refills | Status: DC

## 2019-02-20 NOTE — Progress Notes (Signed)
Subjective:  Patient ID: Megan Church, female    DOB: [birth year]  Age: 63 y.o. MRN: [id number]  CC: This lady comes in for follow-up of her multiple medical problems including uncontrolled diabetes, hypothyroidism, morbid obesity, hyperlipidemia, gastroesophageal reflux disease and vitamin D deficiency.   HPI She continues to have challenges regarding her diabetes.  She does not seem to keep to a nutritional plan.  Previously, she was doing intermittent fasting and was being somewhat successful but now no longer this is happening.  She continues to take insulin for her diabetes and overall she tells me her blood sugar levels seem to be better.  Her last hemoglobin A1c was elevated. She continues to take desiccated thyroid for her hypothyroidism without any problems. She denies any chest pain, dyspnea, palpitations or limb weakness.    Past Medical History:  Diagnosis Date  . Arthritis   . Depression   . Diabetes mellitus without complication ([place])    2  . GERD (gastroesophageal reflux disease)   . HLD (hyperlipidemia)   . HLD (hyperlipidemia) 02/20/2019  . Hypothyroidism   . Morbid obesity ([place]) 02/20/2019  . Primary hypothyroidism 02/20/2019  . RLS (restless legs syndrome)   . Shingles   . Sleep apnea   . Type II diabetes mellitus, uncontrolled ([place]) 02/20/2019  . Vitamin D deficiency disease 02/20/2019     Social History   Social History Narrative  . Not on file   Social History   Substance and Sexual Activity  Alcohol Use Yes  . Alcohol/week: 7.0 standard drinks  . Types: 7 Shots of liquor per week   Comment: mixed drink 1 a day     Social History   Tobacco Use  Smoking Status Former Smoker  . Packs/day: 1.00  . Years: 8.00  . Pack years: 8.00  . Types: Cigarettes  . Start date: 06/14/1970  . Quit date: 06/14/1978  . Years since quitting: 40.7  Smokeless Tobacco Never Used  Tobacco Comment   quit age 12    Married   Current Meds  Medication Sig  .  Calcium Carb-Cholecalciferol (CALCIUM 600/VITAMIN D3 PO) Take 1 tablet by mouth daily.  . Cholecalciferol (VITAMIN D-3) 125 MCG (5000 UT) TABS Take 10,000 Units by mouth daily at 12 noon.  . Dulaglutide (TRULICITY) 1.5 0000000 SOPN Inject 1.5 mg into the skin every Sunday.   . Insulin Detemir (LEVEMIR) 100 UNIT/ML Pen Inject 40 Units into the skin daily.  . Magnesium 500 MG TABS Take 500 mg by mouth every evening.  . Melatonin 1 MG CAPS Take 1 mg by mouth at bedtime as needed (for sleep).  . metFORMIN (GLUCOPHAGE) 1000 MG tablet Take 1,000 mg by mouth 2 (two) times daily.  . NON FORMULARY Apply 1 application topically every 3 (three) days. Testosterone Compound Cream  . NP THYROID 30 MG tablet Take 30 mg by mouth daily at 12 noon.  Marland Kitchen omeprazole (PRILOSEC) 20 MG capsule Take 20 mg by mouth every other day.  Marland Kitchen rOPINIRole (REQUIP) 3 MG tablet Take 3 mg by mouth at bedtime.  . rosuvastatin (CRESTOR) 20 MG tablet Take 20 mg by mouth at bedtime.  . sitaGLIPtin (JANUVIA) 50 MG tablet Take 50 mg by mouth daily.  Marland Kitchen thyroid (NP THYROID) 120 MG tablet Take 120 mg by mouth daily before breakfast.  . vitamin B-12 (CYANOCOBALAMIN) 1000 MCG tablet Take 1,000 mcg by mouth daily.       Objective:   Today's Vitals: BP 130/70  Pulse 72   Ht 5\' 4"  (1.626 m)   Wt 208 lb 12.8 oz (94.7 kg)   BMI 35.84 kg/m  Vitals with BMI 02/20/2019 01/12/2019 01/05/2019  Height 5\' 4"  5\' 4"  -  Weight 208 lbs 13 oz 205 lbs -  BMI [id number] 0000000 -  Systolic [id number]  Diastolic 70 68 69  Pulse 72 86 91       Physical Exam  She looks systemically well but she remains morbidly obese.  She is really not lost any significant weight.  Assessment     1. Diabetes mellitus without complication ([place])   2. Uncontrolled type 2 diabetes mellitus with hyperglycemia ([place])   3. Primary hypothyroidism   4. Vitamin D deficiency disease   5. Morbid obesity ([place])   6. Gastroesophageal reflux disease without esophagitis   7.  Mixed hyperlipidemia       Plan 1.  Continue with all medications for chronic conditions above.  I have refilled her Levemir, this time is a panel which is what she wanted. 2.Blood was taken for hemoglobin A1c, complete metabolic panel, vitamin D levels, thyroid panel, B12 levels as she was previously anemic. 3.  Further recommendations will depend on blood results and I will see her in about 3 months time for an annual physical exam.     [name], MD

## 2019-02-20 NOTE — Patient Instructions (Signed)
[facility] Dietary Recommendations for Weight Loss What to Avoid . Avoid added sugars o Often added sugar can be found in processed foods such as many condiments, dry cereals, cakes, cookies, chips, crisps, crackers, candies, sweetened drinks, etc.  o Read labels and AVOID/DECREASE use of foods with the following in their ingredient list: Sugar, fructose, high fructose corn syrup, sucrose, glucose, maltose, dextrose, molasses, cane sugar, brown sugar, any type of syrup, agave nectar, etc.   . Avoid snacking in between meals . Avoid foods made with flour o If you are going to eat food made with flour, choose those made with whole-grains; and, minimize your consumption as much as is tolerable . Avoid processed foods o These foods are generally stocked in the middle of the grocery store. Focus on shopping on the perimeter of the grocery.  What to Include . Vegetables o GREEN LEAFY VEGETABLES: Kale, spinach, mustard greens, collard greens, cabbage, broccoli, etc. o OTHER: Asparagus, cauliflower, eggplant, carrots, peas, Brussel sprouts, tomatoes, bell peppers, zucchini, beets, cucumbers, etc. . Grains, seeds, and legumes o Beans: kidney beans, black eyed peas, garbanzo beans, black beans, pinto beans, etc. o Whole, unrefined grains: brown rice, barley, bulgur, oatmeal, etc. . Healthy fats  o Avoid highly processed fats such as vegetable oil o Examples of healthy fats: avocado, olives, virgin olive oil, dark chocolate (?72% Cocoa), nuts (peanuts, almonds, walnuts, cashews, pecans, etc.) . Low - Moderate Intake of Animal Sources of Protein o Meat sources: chicken, turkey, salmon, tuna. Limit to 4 ounces of meat at one time. o Consider limiting dairy sources, but when choosing dairy focus on: PLAIN Greek yogurt, cottage cheese, high-protein milk . Fruit o Choose berries  When to Eat . Intermittent Fasting: o Choosing not to eat for a specific time period, but DO FOCUS ON HYDRATION  when fasting o Multiple Techniques: - Time Restricted Eating: eat 3 meals in a day, each meal lasting no more than 60 minutes, no snacks between meals - 16-18 hour fast: fast for 16 to 18 hours up to 7 days a week. Often suggested to start with 2-3 nonconsecutive days per week.  . Remember the time you sleep is counted as fasting.  . Examples of eating schedule: Fast from 7:00pm-11:00am. Eat between 11:00am-7:00pm.  - 24-hour fast: fast for 24 hours up to every other day. Often suggested to start with 1 day per week . Remember the time you sleep is counted as fasting . Examples of eating schedule:  o Eating day: eat 2-3 meals on your eating day. If doing 2 meals, each meal should last no more than 90 minutes. If doing 3 meals, each meal should last no more than 60 minutes. Finish last meal by 7:00pm. o Fasting day: Fast until 7:00pm.  o IF YOU FEEL UNWELL FOR ANY REASON/IN ANY WAY WHEN FASTING, STOP FASTING BY EATING A NUTRITIOUS SNACK OR LIGHT MEAL o ALWAYS FOCUS ON HYDRATION DURING FASTS - Acceptable Hydration sources: water, broths, tea/coffee (black tea/coffee is best but using a small amount of whole-fat dairy products in coffee/tea is acceptable).  - Poor Hydration Sources: anything with sugar or artificial sweeteners added to it  These recommendations have been developed for patients that are actively receiving medical care from either Dr. Dorwin Fitzhenry or [name] Gray, DNP, NP-C at [facility]. These recommendations are developed for patients with specific medical conditions and are not meant to be distributed or used by others that are not actively receiving care from either provider listed   above at [facility]. It is not appropriate to participate in the above eating plans without proper medical supervision.   Reference: Fung, J. The obesity code. [place]: [place]; 2016.   

## 2019-02-21 ENCOUNTER — Encounter (INDEPENDENT_AMBULATORY_CARE_PROVIDER_SITE_OTHER): Payer: Self-pay | Admitting: Internal Medicine

## 2019-02-21 LAB — COMPLETE METABOLIC PANEL WITH GFR
AG Ratio: 1.6 (calc) (ref 1.0–2.5)
ALT: 61 U/L — ABNORMAL HIGH (ref 6–29)
AST: 79 U/L — ABNORMAL HIGH (ref 10–35)
Albumin: 3.9 g/dL (ref 3.6–5.1)
Alkaline phosphatase (APISO): 105 U/L (ref 37–153)
BUN: 7 mg/dL (ref 7–25)
CO2: 28 mmol/L (ref 20–32)
Calcium: 9.8 mg/dL (ref 8.6–10.4)
Chloride: 104 mmol/L (ref 98–110)
Creat: 0.6 mg/dL (ref 0.50–0.99)
GFR, Est African American: 112 mL/min/{1.73_m2} (ref >=60)
GFR, Est Non African American: 97 mL/min/{1.73_m2} (ref >=60)
Globulin: 2.5 g/dL (calc) (ref 1.9–3.7)
Glucose, Bld: 187 mg/dL — ABNORMAL HIGH (ref 65–99)
Potassium: 4.1 mmol/L (ref 3.5–5.3)
Sodium: 141 mmol/L (ref 135–146)
Total Bilirubin: 0.8 mg/dL (ref 0.2–1.2)
Total Protein: 6.4 g/dL (ref 6.1–8.1)

## 2019-02-21 LAB — CBC
HCT: 39.7 % (ref 35.0–45.0)
Hemoglobin: 12.6 g/dL (ref 11.7–15.5)
MCH: 28.8 pg (ref 27.0–33.0)
MCHC: 31.7 g/dL — ABNORMAL LOW (ref 32.0–36.0)
MCV: 90.8 fL (ref 80.0–100.0)
MPV: 9.8 fL (ref 7.5–12.5)
Platelets: 158 10*3/uL (ref 140–400)
RBC: 4.37 10*6/uL (ref 3.80–5.10)
RDW: 16.4 % — ABNORMAL HIGH (ref 11.0–15.0)
WBC: 5.6 10*3/uL (ref 3.8–10.8)

## 2019-02-21 LAB — HEMOGLOBIN A1C
Hgb A1c MFr Bld: 8.4 % of total Hgb — ABNORMAL HIGH (ref <5.7)
Mean Plasma Glucose: 194 (calc)
eAG (mmol/L): 10.8 (calc)

## 2019-02-21 LAB — VITAMIN D 25 HYDROXY (VIT D DEFICIENCY, FRACTURES): Vit D, 25-Hydroxy: 101 ng/mL — ABNORMAL HIGH (ref 30–100)

## 2019-02-21 LAB — B12 AND FOLATE PANEL
Folate: 10.3 ng/mL
Vitamin B-12: 1027 pg/mL (ref 200–1100)

## 2019-02-21 LAB — T3, FREE: T3, Free: 3.7 pg/mL (ref 2.3–4.2)

## 2019-02-21 LAB — TSH: TSH: 0.05 mIU/L — ABNORMAL LOW (ref 0.40–4.50)

## 2019-02-26 ENCOUNTER — Telehealth (INDEPENDENT_AMBULATORY_CARE_PROVIDER_SITE_OTHER): Payer: Self-pay | Admitting: Internal Medicine

## 2019-02-26 ENCOUNTER — Other Ambulatory Visit (INDEPENDENT_AMBULATORY_CARE_PROVIDER_SITE_OTHER): Payer: Self-pay | Admitting: Internal Medicine

## 2019-02-26 MED ORDER — FUROSEMIDE 40 MG PO TABS: 40.0000 mg | ORAL_TABLET | Freq: Every day | ORAL | 3 refills | Status: DC

## 2019-02-28 NOTE — Telephone Encounter (Signed)
Done

## 2019-03-06 ENCOUNTER — Other Ambulatory Visit (INDEPENDENT_AMBULATORY_CARE_PROVIDER_SITE_OTHER): Payer: Self-pay | Admitting: Internal Medicine

## 2019-03-13 ENCOUNTER — Other Ambulatory Visit (INDEPENDENT_AMBULATORY_CARE_PROVIDER_SITE_OTHER): Payer: Self-pay | Admitting: Internal Medicine

## 2019-03-13 ENCOUNTER — Telehealth (INDEPENDENT_AMBULATORY_CARE_PROVIDER_SITE_OTHER): Payer: Self-pay | Admitting: Internal Medicine

## 2019-03-13 MED ORDER — METFORMIN HCL 1000 MG PO TABS: 1000.0000 mg | ORAL_TABLET | Freq: Two times a day (BID) | ORAL | 0 refills | Status: DC

## 2019-03-13 NOTE — Telephone Encounter (Signed)
done

## 2019-03-14 ENCOUNTER — Other Ambulatory Visit (INDEPENDENT_AMBULATORY_CARE_PROVIDER_SITE_OTHER): Payer: Self-pay | Admitting: Internal Medicine

## 2019-03-14 MED ORDER — METFORMIN HCL 1000 MG PO TABS: 1000.0000 mg | ORAL_TABLET | Freq: Two times a day (BID) | ORAL | 0 refills | Status: DC

## 2019-03-16 ENCOUNTER — Other Ambulatory Visit: Payer: Self-pay

## 2019-03-16 DIAGNOSIS — Z20822 Contact with and (suspected) exposure to covid-19: Secondary | ICD-10-CM

## 2019-03-17 LAB — NOVEL CORONAVIRUS, NAA: SARS-CoV-2, NAA: NOT DETECTED

## 2019-03-20 ENCOUNTER — Other Ambulatory Visit (INDEPENDENT_AMBULATORY_CARE_PROVIDER_SITE_OTHER): Payer: Managed Care, Other (non HMO)

## 2019-03-28 ENCOUNTER — Other Ambulatory Visit (INDEPENDENT_AMBULATORY_CARE_PROVIDER_SITE_OTHER): Payer: Self-pay | Admitting: Internal Medicine

## 2019-03-28 ENCOUNTER — Telehealth (INDEPENDENT_AMBULATORY_CARE_PROVIDER_SITE_OTHER): Payer: Self-pay | Admitting: Internal Medicine

## 2019-03-28 MED ORDER — THYROID 120 MG PO TABS: 120.0000 mg | ORAL_TABLET | Freq: Every day | ORAL | 0 refills | Status: DC

## 2019-03-28 MED ORDER — ROSUVASTATIN CALCIUM 20 MG PO TABS: 20.0000 mg | ORAL_TABLET | Freq: Every day | ORAL | 0 refills | Status: DC

## 2019-03-28 NOTE — Telephone Encounter (Signed)
done

## 2019-03-29 ENCOUNTER — Other Ambulatory Visit (INDEPENDENT_AMBULATORY_CARE_PROVIDER_SITE_OTHER): Payer: Self-pay | Admitting: Internal Medicine

## 2019-03-29 ENCOUNTER — Encounter (INDEPENDENT_AMBULATORY_CARE_PROVIDER_SITE_OTHER): Payer: Self-pay | Admitting: Internal Medicine

## 2019-03-29 NOTE — Telephone Encounter (Signed)
Pt is on the np 120 thyroid; will do new script & send . Also she has more questions for you.

## 2019-04-03 ENCOUNTER — Other Ambulatory Visit (INDEPENDENT_AMBULATORY_CARE_PROVIDER_SITE_OTHER): Payer: Self-pay | Admitting: Internal Medicine

## 2019-04-03 ENCOUNTER — Other Ambulatory Visit (INDEPENDENT_AMBULATORY_CARE_PROVIDER_SITE_OTHER): Payer: Managed Care, Other (non HMO)

## 2019-04-03 ENCOUNTER — Other Ambulatory Visit: Payer: Self-pay

## 2019-04-03 DIAGNOSIS — E1165 Type 2 diabetes mellitus with hyperglycemia: Secondary | ICD-10-CM

## 2019-04-04 ENCOUNTER — Encounter (INDEPENDENT_AMBULATORY_CARE_PROVIDER_SITE_OTHER): Payer: Self-pay | Admitting: Internal Medicine

## 2019-04-04 ENCOUNTER — Other Ambulatory Visit (INDEPENDENT_AMBULATORY_CARE_PROVIDER_SITE_OTHER): Payer: Self-pay | Admitting: Internal Medicine

## 2019-04-04 ENCOUNTER — Other Ambulatory Visit (INDEPENDENT_AMBULATORY_CARE_PROVIDER_SITE_OTHER): Payer: Self-pay

## 2019-04-04 DIAGNOSIS — E1165 Type 2 diabetes mellitus with hyperglycemia: Secondary | ICD-10-CM

## 2019-04-04 LAB — HEMOGLOBIN A1C
Hgb A1c MFr Bld: 8.7 % of total Hgb — ABNORMAL HIGH (ref <5.7)
Mean Plasma Glucose: 203 (calc)
eAG (mmol/L): 11.2 (calc)

## 2019-04-04 NOTE — Telephone Encounter (Signed)
Which one?

## 2019-04-13 ENCOUNTER — Other Ambulatory Visit: Payer: Self-pay

## 2019-04-16 ENCOUNTER — Other Ambulatory Visit (INDEPENDENT_AMBULATORY_CARE_PROVIDER_SITE_OTHER): Payer: Self-pay | Admitting: Internal Medicine

## 2019-04-17 ENCOUNTER — Encounter: Payer: Self-pay | Admitting: Internal Medicine

## 2019-04-17 ENCOUNTER — Ambulatory Visit (INDEPENDENT_AMBULATORY_CARE_PROVIDER_SITE_OTHER): Payer: Managed Care, Other (non HMO) | Admitting: Internal Medicine

## 2019-04-17 VITALS — BP 140/80 | HR 102 | Ht 64.0 in | Wt 210.0 lb

## 2019-04-17 DIAGNOSIS — E039 Hypothyroidism, unspecified: Secondary | ICD-10-CM | POA: Diagnosis not present

## 2019-04-17 DIAGNOSIS — E1165 Type 2 diabetes mellitus with hyperglycemia: Secondary | ICD-10-CM

## 2019-04-17 LAB — T3, FREE: T3, Free: 5.3 pg/mL — ABNORMAL HIGH (ref 2.3–4.2)

## 2019-04-17 LAB — TSH: TSH: 0.08 u[IU]/mL — ABNORMAL LOW (ref 0.35–4.50)

## 2019-04-17 LAB — T4, FREE: Free T4: 0.99 ng/dL (ref 0.60–1.60)

## 2019-04-17 MED ORDER — GLIPIZIDE 5 MG PO TABS: 5.0000 mg | ORAL_TABLET | Freq: Two times a day (BID) | ORAL | 3 refills | Status: DC

## 2019-04-17 MED ORDER — TRULICITY 3 MG/0.5ML ~~LOC~~ SOAJ: 3.0000 mg | SUBCUTANEOUS | 11 refills | Status: DC

## 2019-04-17 NOTE — Progress Notes (Signed)
Patient ID: Megan Church, female   DOB: 05-12-1956, 63 y.o.   MRN: [id number]   HPI: Megan Church is a 63 y.o.-year-old female, referred by her PCP, Dr. Anastasio Champion, for management of DM2, dx in 07/2008, insulin-dependent since 2012, then off, restarted 2020, uncontrolled, without long-term complications.  She will need back surgery (Dr. Rolena Infante)  - for this, her HbA1c needs to be <8%  DM2: Reviewed latest HbA1c levels: Lab Results  Component Value Date   HGBA1C 8.7 (H) 04/03/2019   HGBA1C 8.4 (H) 02/20/2019   HGBA1C 7.9 (H) 01/24/2017   Pt is on a regimen of: - Metformin 1000 mg 2x a day, with meals - Januvia 50 mg daily in a.m. - Glipizide 5 mg 2x a day with meals - Trulicity 1.5 mg weekly - Levemir 40 >> 50 units at bedtime She tried Invokana >> yeast infections.  Pt checks her sugars 1x a day and they are: - am: 120, 170-200, 278 - 2h after b'fast: n/c - before lunch: n/c - 2h after lunch: n/c - before dinner: n/c - 2h after dinner: n/c - bedtime: n/c - nighttime: n/c Lowest sugar was 120; she has hypoglycemia awareness at 120.  Highest sugar was 400  Glucometer: [place]  Pt's meals are: - Breakfast: coffee, occasionally eggs - Lunch: tuna salad - Dinner: chicken, rice/potato, salad - Snacks: 2 in the evening She is doing intermittent fasting -fasting window is in the morning. She is walking and gardening 2-3 times a week.  - no CKD, last BUN/creatinine:  Lab Results  Component Value Date   BUN 7 02/20/2019   BUN 9 02/14/2018   CREATININE 0.60 02/20/2019   CREATININE 0.61 02/14/2018  Not on an ACE inhibitor/ARB.  -+ HL; last set of lipids: No results found for: CHOL, HDL, LDLCALC, LDLDIRECT, TRIG, CHOLHDL On Crestor 20.  - last eye exam was in 2020. No DR. She had cataract sx's this year.  - no numbness and tingling in her feet. On Neurontin for L leg pain 2/2 back pain. She had 2 back surgeries so far.  Pt has FH of DM in paternal uncle.  Of  note, she also has hypothyroidism for "years", on desiccated thyroid extract (NP thyroid).  She is taking a relatively large dose: 150 mcg daily.  She is on NP 120 mg in am and 30 mg at noon: - fasting - in am - at least 30 min from b'fast - no Fe, MVI - + calcium with dinner - + Omeprazole along with the 120 mg dose - not on Biotin  Latest TSH level was quite suppressed: Lab Results  Component Value Date   TSH 0.05 (L) 02/20/2019   She also has an isthmic thyroid nodule 1.8 cm >> Bx 2018: benign.  Pt denies: - feeling nodules in neck - hoarseness - dysphagia - choking - SOB with lying down  Parents have thyroid ds.No FH of thyroid cancer. No h/o radiation tx to head or neck.  No recent contrast studies. No herbal supplements. No Biotin use. No recent steroids use.   She is also on a testosterone compounded cream.  ROS: Constitutional: + Fatigue, + weight gain, + subjective hyperthermia, no subjective hypothermia, no nocturia, + poor sleep Eyes: no blurry vision, no xerophthalmia ENT: no sore throat, no nodules palpated in neck, no dysphagia, no odynophagia, no hoarseness, no tinnitus, no hypoacusis Cardiovascular: no CP, no SOB, no palpitations, + leg swelling Respiratory: + Cough-allergies, no SOB, no wheezing Gastrointestinal: +  N, no V, no D, no C, + acid reflux Musculoskeletal: no muscle, + joint aches-neck Skin: no rash, no hair loss Neurological: no tremors, no numbness or tingling/no dizziness/no HAs Psychiatric: + Depression, no anxiety  Past Medical History:  Diagnosis Date  . Arthritis   . Depression   . Diabetes mellitus without complication ([place])    2  . GERD (gastroesophageal reflux disease)   . HLD (hyperlipidemia)   . HLD (hyperlipidemia) 02/20/2019  . Hypothyroidism   . Morbid obesity ([place]) 02/20/2019  . Primary hypothyroidism 02/20/2019  . RLS (restless legs syndrome)   . Shingles   . Sleep apnea   . Type II diabetes mellitus, uncontrolled ([place])  02/20/2019  . Vitamin D deficiency disease 02/20/2019   Past Surgical History:  Procedure Laterality Date  . BACK SURGERY  2012, 2016   lumbar fusion 2012, cleaning out of area 2016  . BREAST BIOPSY Left 2010  . CHOLECYSTECTOMY  2011  . CLOSED REDUCTION ANKLE FRACTURE    . CLOSED REDUCTION HUMERUS FRACTURE Right 2014  . COLONOSCOPY WITH PROPOFOL N/A 02/21/2018   Procedure: COLONOSCOPY WITH PROPOFOL;  Surgeon: Danie Binder, MD;  Location: [facility];  Service: Endoscopy;  Laterality: N/A;  1:15pm - pt knows to arrive at 10:30  . EYE SURGERY  2016   eye lid droop   . KNEE ARTHROSCOPY Right 2010  . POLYPECTOMY  02/21/2018   Procedure: POLYPECTOMY;  Surgeon: Danie Binder, MD;  Location: [facility];  Service: Endoscopy;;  hepatic flexure polyp  . TOTAL KNEE ARTHROPLASTY Right 01/31/2017   Procedure: RIGHT TOTAL KNEE ARTHROPLASTY;  Surgeon: Paralee Cancel, MD;  Location: [facility];  Service: Orthopedics;  Laterality: Right;  90 mins   Social History   Socioeconomic History  . Marital status: Married    Spouse name: Not on file  . Number of children:  3  . Years of education: Not on file  . Highest education level: Not on file  Occupational History  .  + Homemaker, previously librarian  Social Needs  . Financial resource strain: Not on file  . Food insecurity    Worry: Not on file    Inability: Not on file  . Transportation needs    Medical: Not on file    Non-medical: Not on file  Tobacco Use  . Smoking status: Former Smoker    Packs/day: 1.00    Years: 8.00    Pack years: 8.00    Types: Cigarettes    Start date: 06/14/1970    Quit date: 06/14/1978    Years since quitting: 40.8  . Smokeless tobacco: Never Used  . Tobacco comment: quit age 36  Substance and Sexual Activity  . Alcohol use: Yes    Alcohol/week:  1-2 drinks daily    Varies       . Drug use: No   Current Outpatient Medications on File Prior to Visit  Medication Sig Dispense Refill  . Calcium  Carb-Cholecalciferol (CALCIUM 600/VITAMIN D3 PO) Take 1 tablet by mouth daily.    . Cholecalciferol (VITAMIN D-3) 125 MCG (5000 UT) TABS Take 10,000 Units by mouth daily at 12 noon.    . Dulaglutide (TRULICITY) 1.5 0000000 SOPN Inject 1.5 mg into the skin every Sunday.     . gabapentin (NEURONTIN) 300 MG capsule gabapentin 600 mg tablet    . Magnesium 500 MG TABS Take 500 mg by mouth every evening.    . metFORMIN (GLUCOPHAGE) 1000 MG tablet Take 1 tablet (  1,000 mg total) by mouth 2 (two) times daily. 180 tablet 0  . NP THYROID 30 MG tablet TAKE 1 TABLET DAILY BY MOUTH AT LUNCHTIME 90 tablet 1  . omeprazole (PRILOSEC) 20 MG capsule Take 20 mg by mouth every other day.    Marland Kitchen rOPINIRole (REQUIP) 3 MG tablet TAKE ONE TABLET BY MOUTH AT NIGHT 30 tablet 3  . rosuvastatin (CRESTOR) 20 MG tablet Take 1 tablet (20 mg total) by mouth at bedtime. 90 tablet 0  . sitaGLIPtin (JANUVIA) 50 MG tablet Take 50 mg by mouth daily.    Marland Kitchen thyroid (NP THYROID) 120 MG tablet Take 1 tablet (120 mg total) by mouth daily before breakfast. 90 tablet 0  . furosemide (LASIX) 40 MG tablet Take 1 tablet (40 mg total) by mouth daily. (Patient not taking: Reported on 04/17/2019) 30 tablet 3  . Melatonin 1 MG CAPS Take 1 mg by mouth at bedtime as needed (for sleep).    . NON FORMULARY Apply 1 application topically every 3 (three) days. Testosterone Compound Cream    . vitamin B-12 (CYANOCOBALAMIN) 1000 MCG tablet Take 1,000 mcg by mouth daily.     No current facility-administered medications on file prior to visit.    Allergies  Allergen Reactions  . Sulfa Antibiotics Hives  . Ace Inhibitors Cough  . Invokana [Canagliflozin] Other (See Comments)    Yeast   . Vytorin [Ezetimibe-Simvastatin] Other (See Comments)    Leg cramps  . Atorvastatin Hives   Family History  Problem Relation Age of Onset  . Colon cancer Neg Hx     PE: BP 140/80   Pulse (!) 102   Ht 5\' 4"  (1.626 m)   Wt 210 lb (95.3 kg)   SpO2 96%   BMI 36.05  kg/m  Wt Readings from Last 3 Encounters:  04/17/19 210 lb (95.3 kg)  02/20/19 208 lb 12.8 oz (94.7 kg)  01/12/19 205 lb (93 kg)   Constitutional: overweight, in NAD Eyes: PERRLA, EOMI, no exophthalmos ENT: moist mucous membranes, no thyromegaly, no cervical lymphadenopathy Cardiovascular: Tachycardia, RR, No MRG Respiratory: CTA B Gastrointestinal: abdomen soft, NT, ND, BS+ Musculoskeletal: no deformities, strength intact in all 4 Skin: moist, warm, no rashes Neurological: no tremor with outstretched hands, DTR normal in all 4  ASSESSMENT: 1. DM2, insulin-dependent, uncontrolled, without long-term complications, but with hyperglycemia  2.  Uncontrolled hypothyroidism  PLAN:  1. Patient with long-standing, uncontrolled diabetes, on oral antidiabetic regimen (metformin and DPP 4 inhibitor) + weekly GLP-1 receptor agonist, + long-acting insulin, which became insufficient.  Latest HbA1c from last month was 8.7%, higher. -As of now, she only checks her sugars in the morning and we discussed this is not enough for her to understand her diabetes.  I did she understands what can cause her sugars to go up and down, we will not be successful in managing it.  She agrees to start checking at different times of the day, 1-2 times a day -We discussed about a healthier diet and unfortunately she is cramming the majority of her calories at night, with dinner and then with snacks after dinner.  Unfortunately, this is a practice that is not conducive to good control so we discussed about trying to frontloaded calories earlier in the day and to try to cut out the snacks. -We reviewed her regimen and discussed that GLP-1 receptor agonist are much stronger the DPP 4 inhibitors and there is no benefit of using them together.  We will stop Januvia. -For  now, I will also advised her to increase Trulicity to the newly approved dose of 3 mg weekly.  We will also optimize how she takes her glipizide, 15 to 30  minutes before meals -I advised her to get in touch with me within the next 4 weeks to see if we need escalation of her regimen, in that case, we can try [place] or [place].  She did not tolerate Invokana due to yeast infections in the past but we discussed that she may tolerate other members of the SGLT2 inhibitor class. - I suggested to:  Patient Instructions  Please stop Januvia.  Please continue: - Metformin 1000 mg 2x a day, with meals - Glipizide 5 mg 2x a day, but move these ~15-30 min before meals - Levemir 50 units at bedtime  Please increase: - Trulicity to 3 mg weekly  Try to stop snacking at night!  If the sugars do not improve in the next 4 weeks, please let me know - we may need [place] or [place].  Please let me know if the sugars are consistently <80 or >200.  Please continue NP thyroid 150 mg daily.  Take the thyroid hormone every day, with water, at least 30 minutes before breakfast, separated by at least 4 hours from: - acid reflux medications - calcium - iron - multivitamins  Move Protonix >4h after NP thyroid.  Please stop at the lab.  Please return in 3 months with your sugar log.   - discussed about CBG targets for treatment: 80-130 mg/dL before meals and <180 mg/dL after meals; target HbA1c <7%. - given sugar log and advised how to fill it and to bring it at next appt  - given foot care handout and explained the principles  - given instructions for hypoglycemia management "15-15 rule"  - advised for yearly eye exams  - Return to clinic in 3 mo with sugar log   2.  Uncontrolled hypothyroidism -Patient tells me that she would want me to manage her hypothyroidism, to -We reviewed her recent TSH together and is is quite suppressed.  We discussed about possible risks of thyrotoxicosis to include arrhythmia, hypercoagulability, and osteoporosis.  Also, thyrotoxicosis can worsen diabetes control.  Moreover, if she is planning to have surgery, such a  suppressed TSH is a risk for anesthesia. -She is tachycardic today and she has hot flashes, but no other obvious signs or symptoms of thyrotoxicosis - we discussed about taking the thyroid hormone every day, with water, >30 minutes before breakfast, separated by >4 hours from acid reflux medications, calcium, iron, multivitamins. Pt. is taking a larger dose of NP, 120 mg, in the morning, along with a PPI, so she is not absorbing much of it.  She takes a lower dose, 30 mg with lunch.  I advised her to move the PPI at least 4 hours after the thyroid hormones. - will check thyroid tests today: TSH, free T3 and fT4 - If labs are abnormal, she will need to return for repeat TFTs in 1.5 months  - time spent with the patient: 1 hour, of which >50% was spent in obtaining information about her symptoms, reviewing her previous labs, evaluations, and treatments, counseling her about her conditions (please see the discussed topics above), and developing a plan to further investigate and treat them; she had a number of questions which I addressed.  Component     Latest Ref Rng & Units 04/17/2019  Triiodothyronine,Free,Serum     2.3 - 4.2 pg/mL 5.3 (  H)  TSH     0.35 - 4.50 uIU/mL 0.08 (L)  T4,Free(Direct)     0.60 - 1.60 ng/dL 0.99   TSH is still suppressed.  I would suggest to decrease the dose of NP thyroid to 90 mg + 1/2 of a 30 mg tablet daily (total 105 mg)  in am.  We will then repeat her TFTs in 5 weeks.  Philemon Kingdom, MD PhD [facility]

## 2019-04-17 NOTE — Patient Instructions (Addendum)
Please stop Januvia.  Please continue: - Metformin 1000 mg 2x a day, with meals - Glipizide 5 mg 2x a day, but move these ~15-30 min before meals - Levemir 50 units at bedtime  Please increase: - Trulicity to 3 mg weekly  Try to stop snacking at night!  If the sugars do not improve in the next 4 weeks, please let me know - we may need Jardiance or [place].  Please let me know if the sugars are consistently <80 or >200.  Please continue NP thyroid 150 mg daily.  Take the thyroid hormone every day, with water, at least 30 minutes before breakfast, separated by at least 4 hours from: - acid reflux medications - calcium - iron - multivitamins  Move Protonix >4h after NP thyroid.  Please stop at the lab.  Please return in 3 months with your sugar log.   PATIENT INSTRUCTIONS FOR TYPE 2 DIABETES:  DIET AND EXERCISE Diet and exercise is an important part of diabetic treatment.  We recommended aerobic exercise in the form of brisk walking (working between 40-60% of maximal aerobic capacity, similar to brisk walking) for 150 minutes per week (such as 30 minutes five days per week) along with 3 times per week performing 'resistance' training (using various gauge rubber tubes with handles) 5-10 exercises involving the major muscle groups (upper body, lower body and core) performing 10-15 repetitions (or near fatigue) each exercise. Start at half the above goal but build slowly to reach the above goals. If limited by weight, joint pain, or disability, we recommend daily walking in a swimming pool with water up to waist to reduce pressure from joints while allow for adequate exercise.    BLOOD GLUCOSES Monitoring your blood glucoses is important for continued management of your diabetes. Please check your blood glucoses 2-4 times a day: fasting, before meals and at bedtime (you can rotate these measurements - e.g. one day check before the 3 meals, the next day check before 2 of the meals and  before bedtime, etc.).   HYPOGLYCEMIA (low blood sugar) Hypoglycemia is usually a reaction to not eating, exercising, or taking too much insulin/ other diabetes drugs.  Symptoms include tremors, sweating, hunger, confusion, headache, etc. Treat IMMEDIATELY with 15 grams of Carbs: . 4 glucose tablets .  cup regular juice/soda . 2 tablespoons raisins . 4 teaspoons sugar . 1 tablespoon honey Recheck blood glucose in 15 mins and repeat above if still symptomatic/blood glucose <100.  RECOMMENDATIONS TO REDUCE YOUR RISK OF DIABETIC COMPLICATIONS: * Take your prescribed MEDICATION(S) * Follow a DIABETIC diet: Complex carbs, fiber rich foods, (monounsaturated and polyunsaturated) fats * AVOID saturated/trans fats, high fat foods, >2,300 mg salt per day. * EXERCISE at least 5 times a week for 30 minutes or preferably daily.  * DO NOT SMOKE OR DRINK more than 1 drink a day. * Check your FEET every day. Do not wear tightfitting shoes. Contact us if you develop an ulcer * See your EYE doctor once a year or more if needed * Get a FLU shot once a year * Get a PNEUMONIA vaccine once before and once after age 63 years  GOALS:  * Your Hemoglobin A1c of <7%  * fasting sugars need to be <130 * after meals sugars need to be <180 (2h after you start eating) * Your Systolic BP should be [id number] or lower  * Your Diastolic BP should be 80 or lower  * Your HDL (Good Cholesterol) should be 40 or  higher  * Your LDL (Bad Cholesterol) should be 100 or lower. * Your Triglycerides should be 150 or lower  * Your Urine microalbumin (kidney function) should be <30 * Your Body Mass Index should be 25 or lower    Please consider the following ways to cut down carbs and fat and increase fiber and micronutrients in your diet: - substitute whole grain for white bread or pasta - substitute brown rice for white rice - substitute 90-calorie flat bread pieces for slices of bread when possible - substitute sweet potatoes  or yams for white potatoes - substitute humus for margarine - substitute tofu for cheese when possible - substitute almond or rice milk for regular milk (would not drink soy milk daily due to concern for soy estrogen influence on breast cancer risk) - substitute dark chocolate for other sweets when possible - substitute water - can add lemon or orange slices for taste - for diet sodas (artificial sweeteners will trick your body that you can eat sweets without getting calories and will lead you to overeating and weight gain in the long run) - do not skip breakfast or other meals (this will slow down the metabolism and will result in more weight gain over time)  - can try smoothies made from fruit and almond/rice milk in am instead of regular breakfast - can also try old-fashioned (not instant) oatmeal made with almond/rice milk in am - order the dressing on the side when eating salad at a restaurant (pour less than half of the dressing on the salad) - eat as little meat as possible - can try juicing, but should not forget that juicing will get rid of the fiber, so would alternate with eating raw veg./fruits or drinking smoothies - use as little oil as possible, even when using olive oil - can dress a salad with a mix of balsamic vinegar and lemon juice, for e.g. - use agave nectar, stevia sugar, or regular sugar rather than artificial sweateners - steam or broil/roast veggies  - snack on veggies/fruit/nuts (unsalted, preferably) when possible, rather than processed foods - reduce or eliminate aspartame in diet (it is in diet sodas, chewing gum, etc) Read the labels!  Try to read Dr. Janene Harvey book: "Program for Reversing Diabetes" for other ideas for healthy eating.

## 2019-04-18 ENCOUNTER — Encounter: Payer: Self-pay | Admitting: Internal Medicine

## 2019-04-18 MED ORDER — NP THYROID 90 MG PO TABS: 90.0000 mg | ORAL_TABLET | Freq: Every day | ORAL | 3 refills | Status: DC

## 2019-04-18 MED ORDER — NP THYROID 30 MG PO TABS: ORAL_TABLET | ORAL | 1 refills | Status: DC

## 2019-05-03 ENCOUNTER — Other Ambulatory Visit (INDEPENDENT_AMBULATORY_CARE_PROVIDER_SITE_OTHER): Payer: Self-pay

## 2019-05-03 DIAGNOSIS — E782 Mixed hyperlipidemia: Secondary | ICD-10-CM

## 2019-05-03 MED ORDER — ROSUVASTATIN CALCIUM 20 MG PO TABS: 20.0000 mg | ORAL_TABLET | Freq: Every day | ORAL | 1 refills | Status: DC

## 2019-05-11 ENCOUNTER — Encounter (INDEPENDENT_AMBULATORY_CARE_PROVIDER_SITE_OTHER): Payer: Self-pay | Admitting: Internal Medicine

## 2019-05-12 ENCOUNTER — Encounter: Payer: Self-pay | Admitting: Internal Medicine

## 2019-05-14 ENCOUNTER — Other Ambulatory Visit (INDEPENDENT_AMBULATORY_CARE_PROVIDER_SITE_OTHER): Payer: Self-pay | Admitting: Internal Medicine

## 2019-05-14 MED ORDER — ROSUVASTATIN CALCIUM 20 MG PO TABS: 20.0000 mg | ORAL_TABLET | Freq: Every day | ORAL | 0 refills | Status: DC

## 2019-05-14 MED ORDER — TRULICITY 3 MG/0.5ML ~~LOC~~ SOAJ: 3.0000 mg | SUBCUTANEOUS | 11 refills | Status: DC

## 2019-05-22 ENCOUNTER — Other Ambulatory Visit: Payer: Self-pay | Admitting: Internal Medicine

## 2019-05-22 ENCOUNTER — Other Ambulatory Visit: Payer: Self-pay

## 2019-05-22 ENCOUNTER — Other Ambulatory Visit (INDEPENDENT_AMBULATORY_CARE_PROVIDER_SITE_OTHER): Payer: Managed Care, Other (non HMO)

## 2019-05-22 DIAGNOSIS — E039 Hypothyroidism, unspecified: Secondary | ICD-10-CM | POA: Diagnosis not present

## 2019-05-22 LAB — T3, FREE: T3, Free: 4.1 pg/mL (ref 2.3–4.2)

## 2019-05-22 LAB — TSH: TSH: 2.16 u[IU]/mL (ref 0.35–4.50)

## 2019-05-22 LAB — T4, FREE: Free T4: 0.71 ng/dL (ref 0.60–1.60)

## 2019-05-22 MED ORDER — NP THYROID 30 MG PO TABS: ORAL_TABLET | ORAL | 1 refills | Status: DC

## 2019-05-23 ENCOUNTER — Encounter (INDEPENDENT_AMBULATORY_CARE_PROVIDER_SITE_OTHER): Payer: Self-pay | Admitting: Internal Medicine

## 2019-05-23 ENCOUNTER — Other Ambulatory Visit (INDEPENDENT_AMBULATORY_CARE_PROVIDER_SITE_OTHER): Payer: Self-pay | Admitting: Internal Medicine

## 2019-05-23 ENCOUNTER — Ambulatory Visit (INDEPENDENT_AMBULATORY_CARE_PROVIDER_SITE_OTHER): Payer: Managed Care, Other (non HMO) | Admitting: Internal Medicine

## 2019-05-23 VITALS — BP 130/68 | HR 72 | Ht 64.0 in | Wt 211.4 lb

## 2019-05-23 DIAGNOSIS — E1165 Type 2 diabetes mellitus with hyperglycemia: Secondary | ICD-10-CM

## 2019-05-23 DIAGNOSIS — E039 Hypothyroidism, unspecified: Secondary | ICD-10-CM

## 2019-05-23 DIAGNOSIS — E559 Vitamin D deficiency, unspecified: Secondary | ICD-10-CM

## 2019-05-23 DIAGNOSIS — Z0001 Encounter for general adult medical examination with abnormal findings: Secondary | ICD-10-CM | POA: Diagnosis not present

## 2019-05-23 DIAGNOSIS — E782 Mixed hyperlipidemia: Secondary | ICD-10-CM

## 2019-05-23 DIAGNOSIS — K219 Gastro-esophageal reflux disease without esophagitis: Secondary | ICD-10-CM

## 2019-05-23 NOTE — Progress Notes (Signed)
Chief Complaint: This 63 year old lady comes in for an annual physical exam and to address her chronic conditions which are described below. HPI: She was recently seen by endocrinology for her diabetes and adjustments have been made to her dose of Trulicity and also desiccated NP thyroid.  The patient does describe that she was getting some jittery feeling, feeling hot and therefore I agree with reduction of dose of desiccated thyroid. She continues with statin therapy for her hyperlipidemia in the face of diabetes. She still finds it challenging to consistently lose weight.  She had been doing intermittent fasting a couple of days in the week but I am not sure she has been very consistent overall. She denies any chest pain, dyspnea, palpitations or limb weakness. As far as her diabetes is concerned, her last hemoglobin A1c was 8.7%.  She denies any polyuria or polydipsia.  She denies any paresthesia of her feet or hands.  She has seen an ophthalmologist in the last year.  Past Medical History:  Diagnosis Date  . Arthritis   . Depression   . GERD (gastroesophageal reflux disease)   . HLD (hyperlipidemia) 02/20/2019  . Morbid obesity ([place]) 02/20/2019  . Primary hypothyroidism 02/20/2019  . RLS (restless legs syndrome)   . Shingles   . Sleep apnea   . Type II diabetes mellitus, uncontrolled ([place]) 02/20/2019  . Vitamin D deficiency disease 02/20/2019   Past Surgical History:  Procedure Laterality Date  . BACK SURGERY  2012, 2016   lumbar fusion 2012, cleaning out of area 2016  . BREAST BIOPSY Left 2010  . CHOLECYSTECTOMY  2011  . CLOSED REDUCTION ANKLE FRACTURE    . CLOSED REDUCTION HUMERUS FRACTURE Right 2014  . COLONOSCOPY WITH PROPOFOL N/A 02/21/2018   Procedure: COLONOSCOPY WITH PROPOFOL;  Surgeon: Danie Binder, MD;  Location: [facility];  Service: Endoscopy;  Laterality: N/A;  1:15pm - pt knows to arrive at 10:30  . EYE SURGERY  2016   eye lid droop   . KNEE ARTHROSCOPY Right 2010   . POLYPECTOMY  02/21/2018   Procedure: POLYPECTOMY;  Surgeon: Danie Binder, MD;  Location: [facility];  Service: Endoscopy;;  hepatic flexure polyp  . TOTAL KNEE ARTHROPLASTY Right 01/31/2017   Procedure: RIGHT TOTAL KNEE ARTHROPLASTY;  Surgeon: Paralee Cancel, MD;  Location: [facility];  Service: Orthopedics;  Laterality: Right;  90 mins     Social History   Social History Narrative   Married for 42 years.Moved from [place] in December as husband found new job.Used to be a librarian,retired.Husband a Health visitor from [place] retired.    Social History   Tobacco Use  . Smoking status: Former Smoker    Packs/day: 1.00    Years: 8.00    Pack years: 8.00    Types: Cigarettes    Start date: 06/14/1970    Quit date: 06/14/1978    Years since quitting: 40.9  . Smokeless tobacco: Never Used  . Tobacco comment: quit age 47  Substance Use Topics  . Alcohol use: Yes    Alcohol/week: 14.0 standard drinks    Types: 7 Glasses of wine, 7 Shots of liquor per week      Allergies:  Allergies  Allergen Reactions  . Sulfa Antibiotics Hives  . Ace Inhibitors Cough  . Invokana [Canagliflozin] Other (See Comments)    Yeast   . Vytorin [Ezetimibe-Simvastatin] Other (See Comments)    Leg cramps  . Atorvastatin Hives     Current Meds  Medication Sig  . Calcium Carb-Cholecalciferol (CALCIUM 600/VITAMIN D3 PO) Take 1 tablet by mouth daily.  . Cholecalciferol (VITAMIN D-3) 125 MCG (5000 UT) TABS Take 10,000 Units by mouth daily at 12 noon.  . Dulaglutide (TRULICITY) 3 0000000 SOPN Inject 3 mg into the skin once a week.  . furosemide (LASIX) 40 MG tablet Take 1 tablet (40 mg total) by mouth daily. (Patient taking differently: Take 40 mg by mouth daily as needed. )  . gabapentin (NEURONTIN) 300 MG capsule Take 300 mg by mouth daily as needed.   Marland Kitchen glipiZIDE (GLUCOTROL) 5 MG tablet Take 1 tablet (5 mg total) by mouth 2 (two) times daily before a meal.  . Magnesium 500 MG TABS Take  500 mg by mouth every evening.  . Melatonin 1 MG CAPS Take 1 mg by mouth at bedtime as needed (for sleep).  . metFORMIN (GLUCOPHAGE) 1000 MG tablet Take 1 tablet (1,000 mg total) by mouth 2 (two) times daily.  . NON FORMULARY Apply 1 application topically every 3 (three) days. Testosterone Compound Cream  . NP THYROID 30 MG tablet Take half a tablet by mouth before breakfast  . NP THYROID 90 MG tablet Take 1 tablet (90 mg total) by mouth daily.  Marland Kitchen omeprazole (PRILOSEC) 20 MG capsule Take 20 mg by mouth every other day.  Marland Kitchen rOPINIRole (REQUIP) 3 MG tablet TAKE ONE TABLET BY MOUTH AT NIGHT  . rosuvastatin (CRESTOR) 20 MG tablet Take 1 tablet (20 mg total) by mouth at bedtime.  . vitamin B-12 (CYANOCOBALAMIN) 1000 MCG tablet Take 1,000 mcg by mouth daily.     Nutrition This is not very consistent at the present time but she is hoping to do better.  Sleep Adequate.  Exercise None regular.    [id number] from the symptoms mentioned above,there are no other symptoms referable to all systems reviewed.  Physical Exam: Blood pressure 130/68, pulse 72, height 5\' 4"  (1.626 m), weight 211 lb 6.4 oz (95.9 kg). Vitals with BMI 05/23/2019 04/17/2019 02/20/2019  Height 5\' 4"  5\' 4"  5\' 4"   Weight 211 lbs 6 oz 210 lbs 208 lbs 13 oz  BMI 36.27 [id number]  Systolic [id number]  Diastolic 68 80 70  Pulse 72 102 72      She remains morbidly obese. General: Alert, cooperative, and appears to be the stated age.No pallor.  No jaundice.  No clubbing. Head: Normocephalic Eyes: Sclera white, pupils equal and reactive to light, red reflex x 2,  Ears: Normal bilaterally Oral cavity: Lips, mucosa, and tongue normal: Teeth and gums normal Neck: No adenopathy, supple, symmetrical, trachea midline, and thyroid does not appear enlarged. Breast: No masses felt. Respiratory: Clear to auscultation bilaterally.No wheezing, crackles or bronchial breathing. Cardiovascular: Heart sounds are present and appear to be  normal without murmurs or added sounds.  No carotid bruits.  Peripheral pulses are present and equal bilaterally.: Gastrointestinal:positive bowel sounds, no hepatosplenomegaly.  No masses felt.No tenderness. Skin: Clear, No rashes noted.No worrisome skin lesions seen. Neurological: Grossly intact without focal findings, cranial nerves II through XII intact, muscle strength equal bilaterally.  Examination of her feet by microfilament did not show any evidence of peripheral neuropathy. Musculoskeletal: No acute joint abnormalities noted.Full range of movement noted with joints. Psychiatric: Affect appropriate, non-anxious.    Assessment  1. Uncontrolled type 2 diabetes mellitus with hyperglycemia ([place])   2. Primary hypothyroidism   3. Vitamin D deficiency disease   4. Morbid obesity ([place])   5. Mixed hyperlipidemia  6. Gastroesophageal reflux disease without esophagitis   7. Encounter for general adult medical examination with abnormal findings     Tests Ordered:   Orders Placed This Encounter  Procedures  . Microalbumin, urine     Plan  1. She will continue with diabetic medications and is being followed by endocrinology. 2. She will continue with slightly lower dose of desiccated thyroid and her free T3 level is at the upper limit of normal and she is tolerating this dose. 3. She will continue with vitamin D3 supplementation and her levels measured previously were optimal. 4. As far as her obesity is concerned, we discussed the importance of trying to make lifestyle changes and trying to be the person she would eventually like to be by following dietary recommendations.  I have given her diet sheet today regarding this. 5. She will continue with her PPI for the time being for gastroesophageal reflux disease. 6. I will follow her up in about 6 months as she is going to see endocrinology in February. 7. Today, in addition to a preventative visit, I performed an office visit to  address her chronic conditions above.     No orders of the defined types were placed in this encounter.    Geovani Tootle C Lakeithia Rasor   05/23/2019, 11:29 AM

## 2019-05-23 NOTE — Patient Instructions (Signed)
[facility] Dietary Recommendations for Weight Loss What to Avoid . Avoid added sugars o Often added sugar can be found in processed foods such as many condiments, dry cereals, cakes, cookies, chips, crisps, crackers, candies, sweetened drinks, etc.  o Read labels and AVOID/DECREASE use of foods with the following in their ingredient list: Sugar, fructose, high fructose corn syrup, sucrose, glucose, maltose, dextrose, molasses, cane sugar, brown sugar, any type of syrup, agave nectar, etc.   . Avoid snacking in between meals . Avoid foods made with flour o If you are going to eat food made with flour, choose those made with whole-grains; and, minimize your consumption as much as is tolerable . Avoid processed foods o These foods are generally stocked in the middle of the grocery store. Focus on shopping on the perimeter of the grocery.  What to Include . Vegetables o GREEN LEAFY VEGETABLES: Kale, spinach, mustard greens, collard greens, cabbage, broccoli, etc. o OTHER: Asparagus, cauliflower, eggplant, carrots, peas, Brussel sprouts, tomatoes, bell peppers, zucchini, beets, cucumbers, etc. . Grains, seeds, and legumes o Beans: kidney beans, black eyed peas, garbanzo beans, black beans, pinto beans, etc. o Whole, unrefined grains: brown rice, barley, bulgur, oatmeal, etc. . Healthy fats  o Avoid highly processed fats such as vegetable oil o Examples of healthy fats: avocado, olives, virgin olive oil, dark chocolate (?72% Cocoa), nuts (peanuts, almonds, walnuts, cashews, pecans, etc.) . Low - Moderate Intake of Animal Sources of Protein o Meat sources: chicken, turkey, salmon, tuna. Limit to 4 ounces of meat at one time. o Consider limiting dairy sources, but when choosing dairy focus on: PLAIN Greek yogurt, cottage cheese, high-protein milk . Fruit o Choose berries  When to Eat . Intermittent Fasting: o Choosing not to eat for a specific time period, but DO FOCUS ON HYDRATION  when fasting o Multiple Techniques: - Time Restricted Eating: eat 3 meals in a day, each meal lasting no more than 60 minutes, no snacks between meals - 16-18 hour fast: fast for 16 to 18 hours up to 7 days a week. Often suggested to start with 2-3 nonconsecutive days per week.  . Remember the time you sleep is counted as fasting.  . Examples of eating schedule: Fast from 7:00pm-11:00am. Eat between 11:00am-7:00pm.  - 24-hour fast: fast for 24 hours up to every other day. Often suggested to start with 1 day per week . Remember the time you sleep is counted as fasting . Examples of eating schedule:  o Eating day: eat 2-3 meals on your eating day. If doing 2 meals, each meal should last no more than 90 minutes. If doing 3 meals, each meal should last no more than 60 minutes. Finish last meal by 7:00pm. o Fasting day: Fast until 7:00pm.  o IF YOU FEEL UNWELL FOR ANY REASON/IN ANY WAY WHEN FASTING, STOP FASTING BY EATING A NUTRITIOUS SNACK OR LIGHT MEAL o ALWAYS FOCUS ON HYDRATION DURING FASTS - Acceptable Hydration sources: water, broths, tea/coffee (black tea/coffee is best but using a small amount of whole-fat dairy products in coffee/tea is acceptable).  - Poor Hydration Sources: anything with sugar or artificial sweeteners added to it  These recommendations have been developed for patients that are actively receiving medical care from either Dr. Aleph Nickson or [name] Gray, DNP, NP-C at [facility]. These recommendations are developed for patients with specific medical conditions and are not meant to be distributed or used by others that are not actively receiving care from either provider listed   above at [name]. It is not appropriate to participate in the above eating plans without proper medical supervision.   Reference: Fung, J. The obesity code. [place]: [place]; 2016.   

## 2019-05-24 LAB — MICROALBUMIN, URINE: Microalb, Ur: 8.1 mg/dL

## 2019-05-25 ENCOUNTER — Encounter: Payer: Self-pay | Admitting: Internal Medicine

## 2019-05-28 MED ORDER — NP THYROID 90 MG PO TABS: 90.0000 mg | ORAL_TABLET | Freq: Every day | ORAL | 1 refills | Status: DC

## 2019-05-31 ENCOUNTER — Other Ambulatory Visit (INDEPENDENT_AMBULATORY_CARE_PROVIDER_SITE_OTHER): Payer: Self-pay | Admitting: Internal Medicine

## 2019-06-06 ENCOUNTER — Other Ambulatory Visit (INDEPENDENT_AMBULATORY_CARE_PROVIDER_SITE_OTHER): Payer: Self-pay | Admitting: Internal Medicine

## 2019-07-11 ENCOUNTER — Encounter (INDEPENDENT_AMBULATORY_CARE_PROVIDER_SITE_OTHER): Payer: Self-pay | Admitting: Internal Medicine

## 2019-07-12 ENCOUNTER — Other Ambulatory Visit (INDEPENDENT_AMBULATORY_CARE_PROVIDER_SITE_OTHER): Payer: Self-pay

## 2019-07-12 NOTE — Progress Notes (Unsigned)
  The needles pt needs reordered is: BD Nano Ultra-Fine pen Needles.  71mm x 32G.  Thank you

## 2019-07-17 ENCOUNTER — Other Ambulatory Visit (INDEPENDENT_AMBULATORY_CARE_PROVIDER_SITE_OTHER): Payer: Self-pay | Admitting: Internal Medicine

## 2019-07-17 ENCOUNTER — Other Ambulatory Visit (INDEPENDENT_AMBULATORY_CARE_PROVIDER_SITE_OTHER): Payer: Self-pay

## 2019-07-17 ENCOUNTER — Other Ambulatory Visit: Payer: Self-pay

## 2019-07-17 DIAGNOSIS — E1165 Type 2 diabetes mellitus with hyperglycemia: Secondary | ICD-10-CM

## 2019-07-17 DIAGNOSIS — E119 Type 2 diabetes mellitus without complications: Secondary | ICD-10-CM

## 2019-07-17 MED ORDER — UNABLE TO FIND: 1.0000 | Freq: Every day | 3 refills | Status: AC

## 2019-07-17 NOTE — Progress Notes (Signed)
BD Nano Ultra-Fine Pen Needles  40mm x 32G

## 2019-07-18 ENCOUNTER — Telehealth (INDEPENDENT_AMBULATORY_CARE_PROVIDER_SITE_OTHER): Payer: Self-pay

## 2019-07-19 ENCOUNTER — Ambulatory Visit (INDEPENDENT_AMBULATORY_CARE_PROVIDER_SITE_OTHER): Payer: Managed Care, Other (non HMO) | Admitting: Internal Medicine

## 2019-07-19 ENCOUNTER — Encounter: Payer: Self-pay | Admitting: Internal Medicine

## 2019-07-19 ENCOUNTER — Other Ambulatory Visit: Payer: Self-pay

## 2019-07-19 VITALS — BP 120/82 | HR 86 | Ht 64.0 in | Wt 206.0 lb

## 2019-07-19 DIAGNOSIS — E1165 Type 2 diabetes mellitus with hyperglycemia: Secondary | ICD-10-CM

## 2019-07-19 DIAGNOSIS — E039 Hypothyroidism, unspecified: Secondary | ICD-10-CM

## 2019-07-19 LAB — POCT GLYCOSYLATED HEMOGLOBIN (HGB A1C): Hemoglobin A1C: 7 % — AB (ref 4.0–5.6)

## 2019-07-19 MED ORDER — GLIPIZIDE 5 MG PO TABS: 5.0000 mg | ORAL_TABLET | Freq: Every day | ORAL | 3 refills | Status: DC

## 2019-07-19 NOTE — Telephone Encounter (Signed)
Order was called into [facility] to pharmacist.

## 2019-07-19 NOTE — Progress Notes (Signed)
Patient ID: Megan Church, female   DOB: February 10, 1956, 64 y.o.   MRN: [id number]   HPI: Megan Church is a 64 y.o.-year-old female, referred by her PCP, Dr. Anastasio Champion, for management of DM2, dx in 07/2008, insulin-dependent since 2012, then off, restarted 2020, uncontrolled, without long-term complications.  She needs back surgery (Dr. Rolena Infante)  -for this, her HbA1c needs to be lower than 8%.  DM2: Review latest HbA1c levels: Lab Results  Component Value Date   HGBA1C 8.7 (H) 04/03/2019   HGBA1C 8.4 (H) 02/20/2019   HGBA1C 7.9 (H) 01/24/2017   Pt is on a regimen of: - Metformin 1000 mg 2x a day, with meals -  >> stopped 03/2019 - Glipizide 5 mg 2x a day with meals, moved before meals - Trulicity 1.5 mg weekly >> increased to 3 mg weekly 03/2019 - Levemir 40 >> 50 units at bedtime She tried Invokana >> yeast infections.  Pt checks her sugars 2-4 times a day-per review of her log: - am: 120, 170-200, 278 >> 86, 166, 200, 332 - 2h after b'fast: n/c >> 249 - before lunch: n/c >> 132-215, 276 - 2h after lunch: n/c >> 159-208, 220 - before dinner: n/c >> 53, 77-169 - 2h after dinner: n/c >> 62, 99, 129, 222 - bedtime: n/c >> 131 - nighttime: n/c >> 126 Lowest sugar was 120 >> 53; she has hypoglycemia awareness at 90.  Highest sugar was 400 >> 332.  Glucometer: [place]  Pt's meals are: - Breakfast: coffee, occasionally eggs - Lunch: tuna salad - Dinner: chicken, rice/potato, salad - Snacks: 2 in the evening She is doing intermittent fasting -fasting window is in the morning.  She takes glipizide occasionally in the morning but mostly before lunch. She is walking and gardening 2-3 times a week.  -No CKD, last BUN/creatinine:  Lab Results  Component Value Date   BUN 7 02/20/2019   BUN 9 02/14/2018   CREATININE 0.60 02/20/2019   CREATININE 0.61 02/14/2018  Not on an ACE inhibitor/ARB.  -+ HL. No results found for: CHOL, HDL, LDLCALC, LDLDIRECT, TRIG, CHOLHDL On Crestor  20.  - last eye exam was in 2020: No DR. She had cataract sx's this year.  -No numbness and tingling in her feet. On Neurontin for L leg pain 2/2 back pain. She had 2 back surgeries so far.  Pt has FH of DM in paternal uncle.  Of note, she also has hypothyroidism for "years", prev. On LT4, now on desiccated thyroid extract (NP thyroid).  At last visit, she was on a relatively large dose, 150 mcg daily, with suppressed TSH (also with fatigue, subjective hyperthermia, insomnia) and we decreased the dose at that time.  She is currently on NP 90+15 mg daily: - in am - fasting - at least 30 min from b'fast - no Fe, MVI - + PPIs later in the day - now qod - + calcium with dinner - not on Biotin  Latest TSH levels reviewed: Lab Results  Component Value Date   TSH 2.16 05/22/2019   TSH 0.08 (L) 04/17/2019   TSH 0.05 (L) 02/20/2019   She also has an isthmic thyroid nodule 1.8 cm >> she had biopsy for this in 2018: Benign.  Pt denies: - feeling nodules in neck - hoarseness - dysphagia - choking - SOB with lying down  Parents have thyroid ds.No FH of thyroid cancer. No FH of thyroid cancer. No h/o radiation tx to head or neck.  No herbal supplements.  No Biotin use. No recent steroids use.   She continues on testosterone compounded cream.  ROS: Constitutional: no weight gain/no weight loss, no fatigue, + subjective hyperthermia, no subjective hypothermia Eyes: no blurry vision, no xerophthalmia ENT: no sore throat, no nodules palpated in neck, no dysphagia, no odynophagia, no hoarseness Cardiovascular: no CP/no SOB/no palpitations/no leg swelling Respiratory: no cough/no SOB/no wheezing Gastrointestinal: no N/no V/no D/no C/no acid reflux Musculoskeletal: no muscle aches/no joint aches Skin: no rashes, no hair loss Neurological: no tremors/no numbness/no tingling/no dizziness  I reviewed pt's medications, allergies, PMH, social hx, family hx, and changes were documented in the  history of present illness. Otherwise, unchanged from my initial visit note.  Past Medical History:  Diagnosis Date  . Arthritis   . Depression   . GERD (gastroesophageal reflux disease)   . HLD (hyperlipidemia) 02/20/2019  . Morbid obesity ([place]) 02/20/2019  . Primary hypothyroidism 02/20/2019  . RLS (restless legs syndrome)   . Shingles   . Sleep apnea   . Type II diabetes mellitus, uncontrolled ([place]) 02/20/2019  . Vitamin D deficiency disease 02/20/2019   Past Surgical History:  Procedure Laterality Date  . BACK SURGERY  2012, 2016   lumbar fusion 2012, cleaning out of area 2016  . BREAST BIOPSY Left 2010  . CHOLECYSTECTOMY  2011  . CLOSED REDUCTION ANKLE FRACTURE    . CLOSED REDUCTION HUMERUS FRACTURE Right 2014  . COLONOSCOPY WITH PROPOFOL N/A 02/21/2018   Procedure: COLONOSCOPY WITH PROPOFOL;  Surgeon: Danie Binder, MD;  Location: [facility];  Service: Endoscopy;  Laterality: N/A;  1:15pm - pt knows to arrive at 10:30  . EYE SURGERY  2016   eye lid droop   . KNEE ARTHROSCOPY Right 2010  . POLYPECTOMY  02/21/2018   Procedure: POLYPECTOMY;  Surgeon: Danie Binder, MD;  Location: [facility];  Service: Endoscopy;;  hepatic flexure polyp  . TOTAL KNEE ARTHROPLASTY Right 01/31/2017   Procedure: RIGHT TOTAL KNEE ARTHROPLASTY;  Surgeon: Paralee Cancel, MD;  Location: [facility];  Service: Orthopedics;  Laterality: Right;  90 mins   Social History   Socioeconomic History  . Marital status: Married    Spouse name: Not on file  . Number of children:  3  . Years of education: Not on file  . Highest education level: Not on file  Occupational History  .  + Homemaker, previously librarian  Social Needs  . Financial resource strain: Not on file  . Food insecurity    Worry: Not on file    Inability: Not on file  . Transportation needs    Medical: Not on file    Non-medical: Not on file  Tobacco Use  . Smoking status: Former Smoker    Packs/day: 1.00    Years: 8.00    Pack years:  8.00    Types: Cigarettes    Start date: 06/14/1970    Quit date: 06/14/1978    Years since quitting: 40.8  . Smokeless tobacco: Never Used  . Tobacco comment: quit age 20  Substance and Sexual Activity  . Alcohol use: Yes    Alcohol/week:  1-2 drinks daily    Varies       . Drug use: No   Current Outpatient Medications on File Prior to Visit  Medication Sig Dispense Refill  . Calcium Carb-Cholecalciferol (CALCIUM 600/VITAMIN D3 PO) Take 1 tablet by mouth daily.    . Cholecalciferol (VITAMIN D-3) 125 MCG (5000 UT) TABS Take 10,000 Units by mouth  daily at 12 noon.    . Dulaglutide (TRULICITY) 3 0000000 SOPN Inject 3 mg into the skin once a week. 4 pen 11  . furosemide (LASIX) 40 MG tablet TAKE 1 TABLET BY MOUTH EVERY DAY 90 tablet 1  . gabapentin (NEURONTIN) 300 MG capsule Take 300 mg by mouth daily as needed.     Marland Kitchen glipiZIDE (GLUCOTROL) 5 MG tablet Take 1 tablet (5 mg total) by mouth 2 (two) times daily before a meal. 180 tablet 3  . Magnesium 500 MG TABS Take 500 mg by mouth every evening.    . Melatonin 1 MG CAPS Take 1 mg by mouth at bedtime as needed (for sleep).    . metFORMIN (GLUCOPHAGE) 1000 MG tablet TAKE 1 TABLET BY MOUTH TWICE A DAY 180 tablet 0  . NON FORMULARY Apply 1 application topically every 3 (three) days. Testosterone Compound Cream    . NP THYROID 30 MG tablet Take half a tablet by mouth before breakfast 90 tablet 1  . NP THYROID 90 MG tablet Take 1 tablet (90 mg total) by mouth daily. 90 tablet 1  . omeprazole (PRILOSEC) 20 MG capsule Take 20 mg by mouth every other day.    Marland Kitchen rOPINIRole (REQUIP) 3 MG tablet TAKE ONE TABLET BY MOUTH AT NIGHT 90 tablet 1  . rosuvastatin (CRESTOR) 20 MG tablet Take 1 tablet (20 mg total) by mouth at bedtime. 90 tablet 0  . UNABLE TO FIND Inject 1 applicator into the skin daily. Med Name:   54mm x 32G pen needles ultra fine 2nd Gen. 1 Container 3  . vitamin B-12 (CYANOCOBALAMIN) 1000 MCG tablet Take 1,000 mcg by mouth daily.    .  [DISCONTINUED] furosemide (LASIX) 40 MG tablet Take 1 tablet (40 mg total) by mouth daily. (Patient taking differently: Take 40 mg by mouth daily as needed. ) 30 tablet 3  . [DISCONTINUED] metFORMIN (GLUCOPHAGE) 1000 MG tablet Take 1 tablet (1,000 mg total) by mouth 2 (two) times daily. 180 tablet 0  . [DISCONTINUED] rOPINIRole (REQUIP) 3 MG tablet TAKE ONE TABLET BY MOUTH AT NIGHT 30 tablet 3   No current facility-administered medications on file prior to visit.   Allergies  Allergen Reactions  . Sulfa Antibiotics Hives  . Ace Inhibitors Cough  . Invokana [Canagliflozin] Other (See Comments)    Yeast   . Vytorin [Ezetimibe-Simvastatin] Other (See Comments)    Leg cramps  . Atorvastatin Hives   Family History  Problem Relation Age of Onset  . Cancer Mother   . Heart disease Father   . Stroke Father   . Cancer Father   . Heart disease Brother   . Hyperlipidemia Brother   . Hyperlipidemia Brother   . Colon cancer Neg Hx     PE: BP 120/82   Pulse 86   Ht 5\' 4"  (1.626 m)   Wt 206 lb (93.4 kg)   SpO2 97%   BMI 35.36 kg/m  Wt Readings from Last 3 Encounters:  07/19/19 206 lb (93.4 kg)  05/23/19 211 lb 6.4 oz (95.9 kg)  04/17/19 210 lb (95.3 kg)   Constitutional: overweight, in NAD Eyes: PERRLA, EOMI, no exophthalmos ENT: moist mucous membranes, no thyromegaly, no cervical lymphadenopathy Cardiovascular: RRR, No MRG Respiratory: CTA B Gastrointestinal: abdomen soft, NT, ND, BS+ Musculoskeletal: no deformities, strength intact in all 4 Skin: moist, warm, no rashes Neurological: no tremor with outstretched hands, DTR normal in all 4  ASSESSMENT: 1. DM2, insulin-dependent, uncontrolled, without long-term complications, but with  hyperglycemia  2.  Hypothyroidism -Previously uncontrolled  PLAN:  1. Patient with longstanding, uncontrolled, type 2 diabetes, on oral antidiabetic regimen with Metformin, sulfonylurea, and also long-acting insulin and weekly GLP-1 receptor  agonist, dose increased at last visit.  At that time, HbA1c was 8.7%.  She was only checking sugars in the morning and I advised her to also check later in the day.  We discussed about improving her diet and spreading her calories throughout the day, since she was mostly cramming all of her calories at night including snacks after dinner -At last visit we also discussed about the possibility of needing an SGLT 2 inhibitor but did not add it yet. -At this visit, sugars have improved, but they are still quite fluctuating.  He did have dietary indiscretions at night, including cereals and, after the disc, her sugars in the morning are high.  Highest since last visit was in the 300s.  However, later in the day, sugars improved and they are mostly at goal.  She did have an episode of low blood sugars in the 50s before dinner and she was able to apply the 15-15 hypoglycemia correction with good results.  For now, since sugars are usually lower than 100 before dinner, I advised her to stop the glipizide in the morning, states she does not usually eat breakfast.  Also, since sugars are usually high in the morning, I advised her to move the entire metformin dose at night.  For now, we will continue the same doses of Levemir and the glipizide at night. - I suggested to:  Patient Instructions  Please continue: - Trulicity 3 mg weekly - Levemir 50 units at bedtime  Please move: - Metformin 2000 mg at night  Decrease: - Glipizide to 5 mg before dinner  Please continue NP thyroid 90 mg + 15 mg in a.m.  Take the thyroid hormone every day, with water, at least 30 minutes before breakfast, separated by at least 4 hours from: - acid reflux medications - calcium - iron - multivitamins  Please stop at the lab.  Please return in 3 months with your sugar log   - we checked her HbA1c: 7.0% (better, at goal) - advised to check sugars at different times of the day - 1-2x a day, rotating check times - advised  for yearly eye exams >> she is UTD - return to clinic in 3 months  2.  Hypothyroidism - latest thyroid labs reviewed with pt >> normal after decreasing her NP thyroid dose at last visit Lab Results  Component Value Date   TSH 2.16 05/22/2019   - she continues on NP thyroid 105 mg daily - pt feels good on this dose. - we discussed about taking the thyroid hormone every day, with water, >30 minutes before breakfast, separated by >4 hours from acid reflux medications, calcium, iron, multivitamins. Pt. is taking it correctly.  We moved Protonix more than 4 hours after thyroid hormones at last visit - will check thyroid tests today: TSH, free T3 and fT4 - If labs are abnormal, she will need to return for repeat TFTs in 1.5 months  Office Visit on 07/19/2019  Component Date Value Ref Range Status  . TSH 07/19/2019 1.52  0.35 - 4.50 uIU/mL Final  . Free T4 07/19/2019 0.70  0.60 - 1.60 ng/dL Final   Comment: Specimens from patients who are undergoing biotin therapy and /or ingesting biotin supplements may contain high levels of biotin.  The higher biotin concentration in  these specimens interferes with this Free T4 assay.  Specimens that contain high levels  of biotin may cause false high results for this Free T4 assay.  Please interpret results in light of the total clinical presentation of the patient.    . T3, Free 07/19/2019 3.6  2.3 - 4.2 pg/mL Final  . Hemoglobin A1C 07/19/2019 7.0* 4.0 - 5.6 % Final   Excellent TFTs.  We will continue the same dose of NP thyroid.  Will CC results to Dr. Rolena Infante.  She is cleared for surgery now from the point of view of her diabetes and hypothyroidism.  Philemon Kingdom, MD PhD [facility] Endocrinology

## 2019-07-19 NOTE — Patient Instructions (Signed)
Please continue: - Trulicity 3 mg weekly - Levemir 50 units at bedtime  Please move: - Metformin 2000 mg at night  Decrease: - Glipizide to 5 mg before dinner  Please continue NP thyroid 90 mg + 15 mg in a.m.  Take the thyroid hormone every day, with water, at least 30 minutes before breakfast, separated by at least 4 hours from: - acid reflux medications - calcium - iron - multivitamins  Please stop at the lab.  Please return in 3 months with your sugar log

## 2019-07-20 LAB — TSH: TSH: 1.52 u[IU]/mL (ref 0.35–4.50)

## 2019-07-20 LAB — T4, FREE: Free T4: 0.7 ng/dL (ref 0.60–1.60)

## 2019-07-20 LAB — T3, FREE: T3, Free: 3.6 pg/mL (ref 2.3–4.2)

## 2019-07-23 ENCOUNTER — Encounter (INDEPENDENT_AMBULATORY_CARE_PROVIDER_SITE_OTHER): Payer: Self-pay | Admitting: Internal Medicine

## 2019-08-06 ENCOUNTER — Encounter (INDEPENDENT_AMBULATORY_CARE_PROVIDER_SITE_OTHER): Payer: Self-pay | Admitting: Internal Medicine

## 2019-08-06 ENCOUNTER — Other Ambulatory Visit (INDEPENDENT_AMBULATORY_CARE_PROVIDER_SITE_OTHER): Payer: Self-pay | Admitting: Internal Medicine

## 2019-08-06 DIAGNOSIS — E119 Type 2 diabetes mellitus without complications: Secondary | ICD-10-CM

## 2019-08-06 DIAGNOSIS — I10 Essential (primary) hypertension: Secondary | ICD-10-CM | POA: Insufficient documentation

## 2019-08-07 ENCOUNTER — Other Ambulatory Visit (INDEPENDENT_AMBULATORY_CARE_PROVIDER_SITE_OTHER): Payer: Self-pay | Admitting: Internal Medicine

## 2019-08-07 DIAGNOSIS — E119 Type 2 diabetes mellitus without complications: Secondary | ICD-10-CM

## 2019-08-07 MED ORDER — LEVEMIR FLEXTOUCH 100 UNIT/ML ~~LOC~~ SOPN: PEN_INJECTOR | SUBCUTANEOUS | 1 refills | Status: DC

## 2019-08-07 NOTE — Telephone Encounter (Signed)
Please read.  RE write Rx for her to send to get her insulin.

## 2019-08-07 NOTE — Telephone Encounter (Signed)
Just a fyi for insulin to help patients.

## 2019-08-07 NOTE — Telephone Encounter (Signed)
Please call to clarify.

## 2019-08-17 ENCOUNTER — Other Ambulatory Visit (INDEPENDENT_AMBULATORY_CARE_PROVIDER_SITE_OTHER): Payer: Self-pay | Admitting: Internal Medicine

## 2019-08-20 ENCOUNTER — Encounter (INDEPENDENT_AMBULATORY_CARE_PROVIDER_SITE_OTHER): Payer: Self-pay | Admitting: Internal Medicine

## 2019-08-20 ENCOUNTER — Other Ambulatory Visit: Payer: Self-pay | Admitting: Orthopedic Surgery

## 2019-08-20 ENCOUNTER — Encounter: Payer: Self-pay | Admitting: Internal Medicine

## 2019-08-20 DIAGNOSIS — M545 Low back pain, unspecified: Secondary | ICD-10-CM

## 2019-08-22 ENCOUNTER — Telehealth: Payer: Self-pay

## 2019-08-22 ENCOUNTER — Encounter: Payer: Self-pay | Admitting: Internal Medicine

## 2019-08-22 NOTE — Telephone Encounter (Signed)
Forms for medical clearance filled out, signed by Dr. Cruzita Lederer and faxed to [facility] with confirmation.

## 2019-08-27 ENCOUNTER — Other Ambulatory Visit (INDEPENDENT_AMBULATORY_CARE_PROVIDER_SITE_OTHER): Payer: Self-pay | Admitting: Internal Medicine

## 2019-08-28 ENCOUNTER — Ambulatory Visit
Admission: RE | Admit: 2019-08-28 | Discharge: 2019-08-28 | Disposition: A | Discharge status: Home / Self Care | Payer: Managed Care, Other (non HMO) | Source: Ambulatory Visit | Attending: Orthopedic Surgery | Admitting: Orthopedic Surgery

## 2019-08-28 ENCOUNTER — Other Ambulatory Visit: Payer: Self-pay

## 2019-08-28 DIAGNOSIS — M545 Low back pain, unspecified: Secondary | ICD-10-CM

## 2019-08-29 ENCOUNTER — Other Ambulatory Visit (INDEPENDENT_AMBULATORY_CARE_PROVIDER_SITE_OTHER): Payer: Self-pay | Admitting: Internal Medicine

## 2019-09-03 ENCOUNTER — Ambulatory Visit: Payer: Self-pay | Admitting: Orthopedic Surgery

## 2019-09-04 NOTE — Progress Notes (Signed)
[id number] [place], [place] [address] [place] [address] Phone: [phone] Fax: [phone]      Your procedure is scheduled on September 12, 2019.  Report to [facility] Main Entrance "A" at 05:30 A.M., and check in at the Admitting office.  Call this number if you have problems the morning of surgery:  [phone]   Call [phone] if you have any questions prior to your surgery date Monday-Friday 8am-4pm    Remember:  Do not eat or drink after midnight the night before your surgery   Take these medicines the morning of surgery with A SIP OF WATER: certirizine (Zyrtec) - if needed Hydrocodone-acetaminophen (Norco/vicodin) - if needed NP Thyroid Omeprazole (Prilosec) Poly Glycol-Propyl Glycol (Systane Free OP) eye drops - if needed  As of today, stop taking all Aspirin (unless instructed by your doctor) and other Aspirin containing products, Vitamins, Fish Oils, and Herbal Medications. Also stop all NSAIDS i.e. Advil, Ibuprofen, Motrin, Aleve, Anaprox, Naproxen, BC, Goody Powders, and all Supplements.   WHAT DO I DO ABOUT MY DIABETES MEDICATION?  Marland Kitchen Do not take oral diabetes medicines (pills) the morning of surgery.  . THE NIGHT BEFORE SURGERY, take 25 units of Insulin Detemir (Levemir)  . The day of surgery, do not take other diabetes injectables, including Trulicity (dulaglutide).    HOW TO MANAGE YOUR DIABETES BEFORE AND AFTER SURGERY  Why is it important to control my blood sugar before and after surgery? . Improving blood sugar levels before and after surgery helps healing and can limit problems. . A way of improving blood sugar control is eating a healthy diet by: o  Eating less sugar and carbohydrates o  Increasing activity/exercise o  Talking with your doctor about reaching your blood sugar goals . High blood sugars (greater than 180 mg/dL) can raise your risk of infections and slow your  recovery, so you will need to focus on controlling your diabetes during the weeks before surgery. . Make sure that the doctor who takes care of your diabetes knows about your planned surgery including the date and location.  How do I manage my blood sugar before surgery? . Check your blood sugar at least 4 times a day, starting 2 days before surgery, to make sure that the level is not too high or low. . Check your blood sugar the morning of your surgery when you wake up and every 2 hours until you get to the Short Stay unit. o If your blood sugar is less than 70 mg/dL, you will need to treat for low blood sugar: - Do not take insulin. - Treat a low blood sugar (less than 70 mg/dL) with  cup of clear juice (cranberry or apple), 4 glucose tablets, OR glucose gel. - Recheck blood sugar in 15 minutes after treatment (to make sure it is greater than 70 mg/dL). If your blood sugar is not greater than 70 mg/dL on recheck, call [phone] for further instructions. . Report your blood sugar to the short stay nurse when you get to Short Stay.  . If you are admitted to the hospital after surgery: o Your blood sugar will be checked by the staff and you will probably be given insulin after surgery (instead of oral diabetes medicines) to make sure you have good blood sugar levels. o The goal for blood sugar control after surgery is 80-180 mg/dL.     No Smoking of any kind,  Tobacco, or Alcohol products 24 hours prior to your procedure.  If you use a CPAP at night, you may bring all equipment for your overnight stay.                        Do not wear jewelry, make up, or nail polish            Do not wear lotions, powders, perfumes/colognes, or deodorant.            Do not shave 48 hours prior to surgery.  Men may shave face and neck.            Do not bring valuables to the hospital.            [facility] is not responsible for any belongings or valuables.   Contacts, glasses, dentures or  bridgework may not be worn into surgery.      For patients admitted to the hospital, discharge time will be determined by your treatment team.   Patients discharged the day of surgery will not be allowed to drive home, and someone needs to stay with them for 24 hours.    Special instructions:   [place]- Preparing For Surgery  Before surgery, you can play an important role. Because skin is not sterile, your skin needs to be as free of germs as possible. You can reduce the number of germs on your skin by washing with CHG (chlorahexidine gluconate) Soap before surgery.  CHG is an antiseptic cleaner which kills germs and bonds with the skin to continue killing germs even after washing.    Oral Hygiene is also important to reduce your risk of infection.  Remember - BRUSH YOUR TEETH THE MORNING OF SURGERY WITH YOUR REGULAR TOOTHPASTE  Please do not use if you have an allergy to CHG or antibacterial soaps. If your skin becomes reddened/irritated stop using the CHG.  Do not shave (including legs and underarms) for at least 48 hours prior to first CHG shower. It is OK to shave your face.  Please follow these instructions carefully.   1. Shower the NIGHT BEFORE SURGERY and the MORNING OF SURGERY with CHG Soap.   2. If you chose to wash your hair, wash your hair first as usual with your normal shampoo.  3. After you shampoo, rinse your hair and body thoroughly to remove the shampoo.  4. Use CHG as you would any other liquid soap. You can apply CHG directly to the skin and wash gently with a scrungie or a clean washcloth.   5. Apply the CHG Soap to your body ONLY FROM THE NECK DOWN.  Do not use on open wounds or open sores. Avoid contact with your eyes, ears, mouth and genitals (private parts). Wash Face and genitals (private parts)  with your normal soap.   6. Wash thoroughly, paying special attention to the area where your surgery will be performed.  7. Thoroughly rinse your body with warm  water from the neck down.  8. DO NOT shower/wash with your normal soap after using and rinsing off the CHG Soap.  9. Pat yourself dry with a CLEAN TOWEL.  10. Wear CLEAN PAJAMAS to bed the night before surgery, wear comfortable clothes the morning of surgery  11. Place CLEAN SHEETS on your bed the night of your first shower and DO NOT SLEEP WITH PETS.   Day of Surgery:   Do not apply any deodorants/lotions.  Please wear clean  clothes to the hospital/surgery center.   Remember to brush your teeth WITH YOUR REGULAR TOOTHPASTE.   Please read over the following fact sheets that you were given.

## 2019-09-05 ENCOUNTER — Encounter (HOSPITAL_COMMUNITY)
Admission: RE | Admit: 2019-09-05 | Discharge: 2019-09-05 | Disposition: A | Discharge status: Home / Self Care | Payer: Managed Care, Other (non HMO) | Source: Ambulatory Visit | Attending: Orthopedic Surgery | Admitting: Orthopedic Surgery

## 2019-09-05 ENCOUNTER — Ambulatory Visit (HOSPITAL_COMMUNITY)
Admission: RE | Admit: 2019-09-05 | Discharge: 2019-09-05 | Disposition: A | Discharge status: Home / Self Care | Payer: Managed Care, Other (non HMO) | Source: Ambulatory Visit | Attending: Orthopedic Surgery | Admitting: Orthopedic Surgery

## 2019-09-05 ENCOUNTER — Ambulatory Visit: Payer: Self-pay | Admitting: Orthopedic Surgery

## 2019-09-05 ENCOUNTER — Encounter (HOSPITAL_COMMUNITY): Payer: Self-pay

## 2019-09-05 ENCOUNTER — Other Ambulatory Visit: Payer: Self-pay

## 2019-09-05 DIAGNOSIS — Z01818 Encounter for other preprocedural examination: Secondary | ICD-10-CM | POA: Insufficient documentation

## 2019-09-05 HISTORY — DX: Anemia, unspecified: D64.9

## 2019-09-05 LAB — BASIC METABOLIC PANEL
Anion gap: 13 (ref 5–15)
BUN: 7 mg/dL — ABNORMAL LOW (ref 8–23)
CO2: 25 mmol/L (ref 22–32)
Calcium: 9.6 mg/dL (ref 8.9–10.3)
Chloride: 99 mmol/L (ref 98–111)
Creatinine, Ser: 0.6 mg/dL (ref 0.44–1.00)
GFR calc Af Amer: >60 mL/min (ref >60)
GFR calc non Af Amer: >60 mL/min (ref >60)
Glucose, Bld: 230 mg/dL — ABNORMAL HIGH (ref 70–99)
Potassium: 3.8 mmol/L (ref 3.5–5.1)
Sodium: 137 mmol/L (ref 135–145)

## 2019-09-05 LAB — APTT: aPTT: 33 seconds (ref 24–36)

## 2019-09-05 LAB — URINALYSIS, ROUTINE W REFLEX MICROSCOPIC
Bilirubin Urine: NEGATIVE
Glucose, UA: NEGATIVE mg/dL
Hgb urine dipstick: NEGATIVE
Ketones, ur: NEGATIVE mg/dL
Nitrite: NEGATIVE
Protein, ur: 30 mg/dL — AB
Specific Gravity, Urine: 1.023 (ref 1.005–1.030)
WBC, UA: >50 WBC/hpf — ABNORMAL HIGH (ref 0–5)
pH: 5 (ref 5.0–8.0)

## 2019-09-05 LAB — CBC
HCT: 42.7 % (ref 36.0–46.0)
Hemoglobin: 13.7 g/dL (ref 12.0–15.0)
MCH: 30.3 pg (ref 26.0–34.0)
MCHC: 32.1 g/dL (ref 30.0–36.0)
MCV: 94.5 fL (ref 80.0–100.0)
Platelets: 186 10*3/uL (ref 150–400)
RBC: 4.52 MIL/uL (ref 3.87–5.11)
RDW: 13.2 % (ref 11.5–15.5)
WBC: 7.8 10*3/uL (ref 4.0–10.5)
nRBC: 0 % (ref 0.0–0.2)

## 2019-09-05 LAB — TYPE AND SCREEN
ABO/RH(D): O POS
Antibody Screen: NEGATIVE

## 2019-09-05 LAB — ABO/RH: ABO/RH(D): O POS

## 2019-09-05 LAB — GLUCOSE, CAPILLARY: Glucose-Capillary: 213 mg/dL — ABNORMAL HIGH (ref 70–99)

## 2019-09-05 LAB — SURGICAL PCR SCREEN
MRSA, PCR: NEGATIVE
Staphylococcus aureus: NEGATIVE

## 2019-09-05 LAB — PROTIME-INR
INR: 1.1 (ref 0.8–1.2)
Prothrombin Time: 13.6 seconds (ref 11.4–15.2)

## 2019-09-05 NOTE — H&P (Signed)
Subjective:   Megan Church Is a pleasant 64 year old female with past medical history significant for diabetes (A1C =7), Hashimoto thyroiditis, and previous L4-5 lumbar fusion who has been dealing with low back, Buttock and radicular left leg pain For over a year.She denies any loss of bladder or bowel control.  Denies any significant weakness.  Denies any urinary symptoms including frequency, urgency, painful urination. We previously discussed surgical intervention, but surgery had to be postponed due to high hemoglobin A1c. Despite conservative care including time, injection therapy, and narcotic medication her quality-of-life has continued to suffer and she would like to move forward with surgical intervention. The patient is here today for a pre-operative History and Physical. They are scheduled for XLIF L2-4, PSFI L2-5 on 09-12-19 and 09-13-19 with Dr. Rolena Infante at [facility].  Patient Active Problem List   Diagnosis Date Noted  . Type II diabetes mellitus, uncontrolled ([place]) 02/20/2019  . Primary hypothyroidism 02/20/2019  . Vitamin D deficiency disease 02/20/2019  . Morbid obesity ([place]) 02/20/2019  . GERD (gastroesophageal reflux disease) 02/20/2019  . HLD (hyperlipidemia) 02/20/2019  . Personal history of colonic polyps 12/08/2017  . S/P right TKA 01/31/2017   Past Medical History:  Diagnosis Date  . Anemia   . Arthritis   . Depression   . GERD (gastroesophageal reflux disease)   . HLD (hyperlipidemia) 02/20/2019  . Morbid obesity ([place]) 02/20/2019  . Primary hypothyroidism 02/20/2019  . RLS (restless legs syndrome)   . Shingles   . Sleep apnea   . Type II diabetes mellitus, uncontrolled ([place]) 02/20/2019  . Vitamin D deficiency disease 02/20/2019    Past Surgical History:  Procedure Laterality Date  . BACK SURGERY  2012, 2016   lumbar fusion 2012, cleaning out of area 2016  . BREAST BIOPSY Left 2010  . CHOLECYSTECTOMY  2011  . CLOSED REDUCTION ANKLE FRACTURE    . CLOSED REDUCTION  HUMERUS FRACTURE Right 2014  . COLONOSCOPY WITH PROPOFOL N/A 02/21/2018   Procedure: COLONOSCOPY WITH PROPOFOL;  Surgeon: Danie Binder, MD;  [facility];  Service: Endoscopy;  Laterality: N/A;  1:15pm - pt knows to arrive at 10:30  . EYE SURGERY  2016   eye lid droop   . KNEE ARTHROSCOPY Right 2010  . POLYPECTOMY  02/21/2018   Procedure: POLYPECTOMY;  Surgeon: Danie Binder, MD;  Location: [facility];  Service: Endoscopy;;  hepatic flexure polyp  . TOTAL KNEE ARTHROPLASTY Right 01/31/2017   Procedure: RIGHT TOTAL KNEE ARTHROPLASTY;  Surgeon: Paralee Cancel, MD;  Location: [facility];  Service: Orthopedics;  Laterality: Right;  90 mins    Current Outpatient Medications  Medication Sig Dispense Refill Last Dose  . Calcium Carb-Cholecalciferol (CALCIUM 600/VITAMIN D3 PO) Take 1 tablet by mouth daily.     . cetirizine (ZYRTEC) 10 MG tablet Take 10 mg by mouth daily as needed for allergies.     . Cholecalciferol (VITAMIN D-3) 125 MCG (5000 UT) TABS Take 10,000 Units by mouth daily at 12 noon.     . Dulaglutide (TRULICITY) 3 0000000 SOPN Inject 3 mg into the skin once a week. 4 pen 11   . furosemide (LASIX) 40 MG tablet TAKE 1 TABLET BY MOUTH EVERY DAY (Patient taking differently: Take 40 mg by mouth daily as needed for fluid or edema. ) 90 tablet 1   . glipiZIDE (GLUCOTROL) 5 MG tablet TAKE 1 TABLET BY MOUTH TWICE A DAY (Patient not taking: Reported on 09/03/2019) 180 tablet 1   . HYDROcodone-acetaminophen (  NORCO/VICODIN) 5-325 MG tablet Take 1 tablet by mouth 4 (four) times daily as needed for pain.     Marland Kitchen ibuprofen (ADVIL) 200 MG tablet Take 400 mg by mouth every 6 (six) hours as needed for mild pain.     . Insulin Detemir (LEVEMIR FLEXTOUCH) 100 UNIT/ML Pen INJECT 40 UNITS INTO THE SKIN DAILY. (Patient taking differently: Inject 50 Units into the skin at bedtime. ) 30 mL 1   . Magnesium 500 MG TABS Take 500 mg by mouth every evening.     . Melatonin 1 MG CAPS Take 1 mg by mouth at  bedtime.      . metFORMIN (GLUCOPHAGE) 1000 MG tablet TAKE 1 TABLET BY MOUTH TWICE A DAY (Patient taking differently: Take 2,000 mg by mouth daily with supper. ) 180 tablet 0   . NON FORMULARY Apply 1 application topically every 3 (three) days. Testosterone Compound Cream     . NP THYROID 30 MG tablet Take half a tablet by mouth before breakfast (Patient taking differently: Take 15 mg by mouth daily before breakfast. Take half a tablet by mouth before breakfast. (Takes with 90mg  for total of 105mg )) 90 tablet 1   . NP THYROID 90 MG tablet Take 1 tablet (90 mg total) by mouth daily. 90 tablet 1   . omeprazole (PRILOSEC) 20 MG capsule Take 20 mg by mouth every other day.     Vladimir Faster Glycol-Propyl Glycol (SYSTANE FREE OP) Place 1 drop into both eyes every 4 (four) hours as needed (Dry eyes).     Marland Kitchen rOPINIRole (REQUIP) 3 MG tablet TAKE ONE TABLET BY MOUTH AT NIGHT (Patient taking differently: Take 3 mg by mouth at bedtime. ) 90 tablet 1   . rosuvastatin (CRESTOR) 20 MG tablet TAKE 1 TABLET BY MOUTH EVERYDAY AT BEDTIME (Patient taking differently: Take 20 mg by mouth at bedtime. ) 90 tablet 0   . UNABLE TO FIND Inject 1 applicator into the skin daily. Med Name:   64mm x 32G pen needles ultra fine 2nd Gen. 1 Container 3   . vitamin B-12 (CYANOCOBALAMIN) 1000 MCG tablet Take 1,000 mcg by mouth daily.      No current facility-administered medications for this visit.   Allergies  Allergen Reactions  . Sulfa Antibiotics Hives  . Ace Inhibitors Cough  . Invokana [Canagliflozin] Other (See Comments)    Yeast   . Vytorin [Ezetimibe-Simvastatin] Other (See Comments)    Leg cramps  . Atorvastatin Hives    Social History   Tobacco Use  . Smoking status: Former Smoker    Packs/day: 1.00    Years: 8.00    Pack years: 8.00    Types: Cigarettes    Start date: 06/14/1970    Quit date: 06/14/1978    Years since quitting: 41.2  . Smokeless tobacco: Never Used  . Tobacco comment: quit age 76  Substance  Use Topics  . Alcohol use: Yes    Alcohol/week: 14.0 standard drinks    Types: 7 Glasses of wine, 7 Shots of liquor per week    Family History  Problem Relation Age of Onset  . Cancer Mother   . Heart disease Father   . Stroke Father   . Cancer Father   . Heart disease Brother   . Hyperlipidemia Brother   . Hyperlipidemia Brother   . Colon cancer Neg Hx     Review of Systems As stated in HPI  Objective:   Vitals: Ht: 5 ft 3.25 in 09/05/2019  01:29 pm Wt: 208 lbs 09/05/2019 01:29 pm BMI: 36.6 09/05/2019 01:29 pm BP: 149/87 L wrist 09/05/2019 01:31 pm Pulse: 96 bpm 09/05/2019 01:31 pm T: 97.8 F 09/05/2019 01:31 pm Pain Scale: 4 09/05/2019 01:29 pm  Clinical exam: Patient is alert and oriented 3. No apparent distress Heart: Regular rate and rhythm, murmurs, or gallops. Lungs: Clear to auscultation bilaterally Abdomen: Soft and nontender, no rebound tenderness, no loss of bowel and bladder control Bowel sounds 4. Lumbar spine: Ongoing significant back buttock and radicular leg pain. No hip, knee, ankle pain with isolated joint range of motion Neuro: 5/5 motor strength in the lower extremity. Positive neuropathic dysesthesias in the left L3 and L4 dermatome. Reflexes: Babinski: Negative Vascular: Lower extremity peripheral pulses are 1+ dorsalis pedis/posterior tibialis pulses bilaterally. Compartments are soft and nontender  Imaging studies: Xrays of the lumbar spine: Asymmetrical collapse of the L3-4 and L2-3 disc spaces producing slight degenerative scoliosis. Hardware at L4-5 appears to be stable and unchanged from prior images. Positive anterior listhesis at L3-4 unchanged. Moderate degenerative L5-S1 disc disease.   Lumbar MRI: completed on 08/13/19 was reviewed with the patient. It was completed at [facility]; I have independently reviewed the images as well as the radiology report. No significant change from her prior MRI of 10/09/18. She is at the previous L4-5  fusion and there is no abnormal enhancement. No significant central or foraminal stenosis. Central laminar defect at L5-S1 with moderate to severe facet arthropathy but no significant canal or foraminal stenosis. Unchanged laminotomies L2-3 and L3-4 with significant foraminal and lateral recess stenosis on the left side. Anterior listhesis is noted L3-4. CT scan of lumbar spine: Taken at an outside facility. The images as well as the report were personally reviewed by me. Study is dated 08/28/2019. At L4-5 there is a solid fusion. At L3-4 there is adjacent segment disease with the anterior listhesis and a left foraminal pseudo-disc extrusion resulting in severe left neural foraminal stenosis and L3 nerve root compression. There is also advanced disc degeneration at L2-S3 with a severe subarticular disc extrusion resulting in left lateral recess stenosis and potential left L3 nerve root impingement. Moderate right neural foraminal stenosis at L5-S1.  Assessment:   Laylianna is a very pleasant 64 year old with past medical history significant for diabetes (A1C =7), Hashimoto thyroiditis who had a previous L4-5 fusion has ongoing back buttock and left radicular leg pain. Imaging studies clearly demonstrate adjacent segment degenerative spondylolisthesis at L3-4 and degenerative lumbar disc disease with left lateral recess stenosis L2-3 and L3-4. Attempts at conservative management have failed to alleviate her symptoms. Last year when we spoke we consider doing a revision fusion but her A1c was 11. Over the last year she has worked very hard in her A1c is now 7. She is still having severe debilitating pain despite the conservative care that we have provided. As a result she would like to move forward with surgery.  Plan:   To address the degenerative disc disease at L2-3 and the degenerative slip at L3-4 I am recommending a lateral interbody fusion L2-3 and L3-4. The indirect decompression at the intervertebral  fusion provides we will also address the recurrent lateral recess and foraminal stenosis causing the radicular leg pain.  I would then move forward with a percutaneous pedicle screws supplemented fusion L2-L5. This would require removing the existing pedicle screw construct at L4-5.  I have gone over the surgical procedure as well as the postoperative course with the patient and her  husband and all of their questions were encouraged and addressed. They have indicated an understanding of the risks and benefits and have both expressed desire to move forward with surgery.  OLIF/XLIF risks, benefits of surgery were reviewed with the patient. These include: infection, bleeding, death, stroke, paralysis, ongoing or worse pain, need for additional surgery, injury to the lumbar plexus resulting in hip flexor weakness and difficulty walking without assistive devices. Adjacent segment degenerative disease, need for additional surgery including fusing other levels, leak of spinal fluid, Nonunion, hardware failure, breakage, or mal-position. Deep venous thrombosis (DVT) requiring additional treatment such as filter, and/or medications. Injury to abdominal contents, loss in bowel and bladder control.  Risks and benefits of spinal fusion: Infection, bleeding, death, stroke, paralysis, ongoing or worse pain, need for additional surgery, nonunion, leak of spinal fluid, adjacent segment degeneration requiring additional fusion surgery, Injury to abdominal vessels that can require anterior surgery to stop bleeding. Malposition of the cage and/or pedicle screws that could require additional surgery. Loss of bowel and bladder control. Postoperative hematoma causing neurologic compression that could require urgent or emergent re-operation.  We have also discussed the goals of surgery to include:  Goals of surgery: Reduction in pain, and improvement in quality of life.  We have also discussed the post-operative recovery  period to include: bathing/showering restrictions, wound healing, activity (and driving) restrictions, medications/pain mangement.  We have also discussed post-operative redflags to include: signs and symptoms of postoperative infection, DVT/PE.  I have reviewed the patient's medication list with her. She is not on any blood thinners or aspirin. She is not taking any anti-inflammatory medications. I did advise her to avoid any anti-inflammatory medications leading up to surgery. She did express an understanding of this.  We have obtained preoperative medical clearance from the patient's PCP and endocrinologist.  Patient has an LSO brace.  Patient was seen this [facility] at [facility] for preoperative testing she does have a UTI and I have sent her an antibiotic to her pharmacy to take leading up to the surgery.  All of patient's questions were invited and answered.

## 2019-09-05 NOTE — Progress Notes (Signed)
PCP - Nimish Gosrani  Chest x-ray - 09-05-19 EKG - 09-05-19  SA - yes, wears CPAP  DM - Type 2 Fasting Blood Sugar - low 200s   COVID TEST- Monday, 09-10-19   Anesthesia review: yes, per order  Patient denies shortness of breath, fever, cough and chest pain at PAT appointment   All instructions explained to the patient, with a verbal understanding of the material. Patient agrees to go over the instructions while at home for a better understanding. Patient also instructed to self quarantine after being tested for COVID-19. The opportunity to ask questions was provided.

## 2019-09-06 ENCOUNTER — Encounter (HOSPITAL_COMMUNITY): Payer: Self-pay | Admitting: Vascular Surgery

## 2019-09-06 ENCOUNTER — Encounter (HOSPITAL_COMMUNITY): Payer: Self-pay | Admitting: Anesthesiology

## 2019-09-06 NOTE — Progress Notes (Addendum)
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  DISCUSSION: Patient is a 64 year old female scheduled for the above procedures. Surgery had been planned for last year, but delayed until her DM was better controlled--A1c now 7.0% on 07/19/19.  History includes former smoker (quit 1/180), DM2, HLD, anemia, hypothyroidism, OSA (uses CPAP), GERD, depression, RLS, back surgery (L4-5 lumbar fusion 2012; 2016), right TKA (01/31/17). BMI is consistent with obesity.   Dr. Rolena Infante obtained preoperative medical clearance from Dr. Anastasio Champion and endocrinology clearance (for DM and hypothyroidism) from Dr. Cruzita Lederer.   UA showed moderate leukocytes, negative nitrites, rare bacteria, > 50 WBC. Message left for [facility] at Dr. Rolena Infante' office regarding results.   Presurgical COVID-19 test is scheduled for 09/10/19. Anesthesia team to evaluate on the day of surgery.    VS: BP (!) 141/81   Pulse 99   Temp (!) 36.3 C (Oral)   Resp 18   Ht 5' 3.75" (1.619 m)   Wt 92.5 kg   SpO2 98%   BMI 35.29 kg/m    PROVIDERS: [name], MD is PCP  Philemon Kingdom, MD is endocrinologist   LABS: Labs reviewed: Acceptable for surgery. POCT A1c 7.0% on 07/19/19. TSH, Free T4 WNL 07/19/19.  (all labs ordered are listed, but only abnormal results are displayed)  Labs Reviewed  GLUCOSE, CAPILLARY - Abnormal; Notable for the following components:      Result Value   Glucose-Capillary 213 (*)    All other components within normal limits  BASIC METABOLIC PANEL - Abnormal; Notable for the following components:   Glucose, Bld 230 (*)    BUN 7 (*)     All other components within normal limits  URINALYSIS, ROUTINE W REFLEX MICROSCOPIC - Abnormal; Notable for the following components:   Color, Urine AMBER (*)    APPearance CLOUDY (*)    Protein, ur 30 (*)    Leukocytes,Ua MODERATE (*)    WBC, UA >50 (*)    Bacteria, UA RARE (*)    All other components within normal limits  SURGICAL PCR SCREEN  APTT  CBC  PROTIME-INR  TYPE AND SCREEN     IMAGES: CXR 09/05/19: FINDINGS: The heart size and mediastinal contours are within normal limits. Both lungs are clear. The visualized skeletal structures are unremarkable. IMPRESSION: No active cardiopulmonary disease.  CT L-spine 08/28/19: IMPRESSION: 1. Solid L4-5 fusion. 2. L3-4 adjacent segment disease with anterolisthesis and a left foraminal pseudo disc extrusion resulting in severe left neural foraminal stenosis and L3 nerve root compression. 3. Advanced disc degeneration at L2-3 with a left subarticular disc extrusion resulting in left lateral recess stenosis and potential left L3 nerve root impingement. 4. Moderate right neural foraminal stenosis at L5-S1.   EKG: 09/05/19 NSR   CV: N/A   Past Medical History:  Diagnosis Date  . Anemia   . Arthritis   . Depression   . GERD (gastroesophageal reflux disease)   . HLD (hyperlipidemia) 02/20/2019  . Morbid obesity ([place]) 02/20/2019  . Primary hypothyroidism 02/20/2019  . RLS (restless legs syndrome)   . Shingles   . Sleep apnea   .  Type II diabetes mellitus, uncontrolled ([place]) 02/20/2019  . Vitamin D deficiency disease 02/20/2019    Past Surgical History:  Procedure Laterality Date  . BACK SURGERY  2012, 2016   lumbar fusion 2012, cleaning out of area 2016  . BREAST BIOPSY Left 2010  . CHOLECYSTECTOMY  2011  . CLOSED REDUCTION ANKLE FRACTURE    . CLOSED REDUCTION HUMERUS FRACTURE Right 2014  . COLONOSCOPY WITH PROPOFOL N/A 02/21/2018   Procedure: COLONOSCOPY WITH PROPOFOL;  Surgeon: Danie Binder, MD;  [facility];  Service: Endoscopy;  Laterality: N/A;  1:15pm - pt knows to arrive at 10:30  . EYE SURGERY  2016   eye lid droop   . KNEE ARTHROSCOPY Right 2010  . POLYPECTOMY  02/21/2018   Procedure: POLYPECTOMY;  Surgeon: Danie Binder, MD;  [facility];  Service: Endoscopy;;  hepatic flexure polyp  . TOTAL KNEE ARTHROPLASTY Right 01/31/2017   Procedure: RIGHT TOTAL KNEE ARTHROPLASTY;  Surgeon: Paralee Cancel, MD;  [facility];  Service: Orthopedics;  Laterality: Right;  90 mins    MEDICATIONS: . Calcium Carb-Cholecalciferol (CALCIUM 600/VITAMIN D3 PO)  . cetirizine (ZYRTEC) 10 MG tablet  . Cholecalciferol (VITAMIN D-3) 125 MCG (5000 UT) TABS  . Dulaglutide (TRULICITY) 3 0000000 SOPN  . furosemide (LASIX) 40 MG tablet  . glipiZIDE (GLUCOTROL) 5 MG tablet  . HYDROcodone-acetaminophen (NORCO/VICODIN) 5-325 MG tablet  . ibuprofen (ADVIL) 200 MG tablet  . Insulin Detemir (LEVEMIR FLEXTOUCH) 100 UNIT/ML Pen  . Magnesium 500 MG TABS  . Melatonin 1 MG CAPS  . metFORMIN (GLUCOPHAGE) 1000 MG tablet  . NON FORMULARY  . NP THYROID 30 MG tablet  . NP THYROID 90 MG tablet  . omeprazole (PRILOSEC) 20 MG capsule  . Polyethyl Glycol-Propyl Glycol (SYSTANE FREE OP)  . rOPINIRole (REQUIP) 3 MG tablet  . rosuvastatin (CRESTOR) 20 MG tablet  . UNABLE TO FIND  . vitamin B-12 (CYANOCOBALAMIN) 1000 MCG tablet   No current facility-administered medications for this encounter.    Myra Gianotti, PA-C Surgical Short Stay/Anesthesiology [facility] Phone [phone] [facility] Phone [phone] 09/07/2019 9:45 AM

## 2019-09-06 NOTE — Anesthesia Preprocedure Evaluation (Deleted)
Anesthesia Evaluation    Airway        Dental   Pulmonary former smoker,           Cardiovascular      Neuro/Psych    GI/Hepatic   Endo/Other  diabetes  Renal/GU      Musculoskeletal   Abdominal   Peds  Hematology   Anesthesia Other Findings   Reproductive/Obstetrics                             Anesthesia Physical Anesthesia Plan  ASA:   Anesthesia Plan:    Post-op Pain Management:    Induction:   PONV Risk Score and Plan:   Airway Management Planned:   Additional Equipment:   Intra-op Plan:   Post-operative Plan:   Informed Consent:   Plan Discussed with:   Anesthesia Plan Comments: (PAT note written by Myra Gianotti, PA-C. )        Anesthesia Quick Evaluation

## 2019-09-10 ENCOUNTER — Other Ambulatory Visit: Payer: Self-pay

## 2019-09-10 ENCOUNTER — Other Ambulatory Visit (HOSPITAL_COMMUNITY)
Admission: RE | Admit: 2019-09-10 | Discharge: 2019-09-10 | Disposition: A | Discharge status: Home / Self Care | Payer: Managed Care, Other (non HMO) | Source: Ambulatory Visit | Attending: Orthopedic Surgery | Admitting: Orthopedic Surgery

## 2019-09-10 DIAGNOSIS — Z01812 Encounter for preprocedural laboratory examination: Secondary | ICD-10-CM | POA: Diagnosis not present

## 2019-09-10 DIAGNOSIS — Z20822 Contact with and (suspected) exposure to covid-19: Secondary | ICD-10-CM | POA: Insufficient documentation

## 2019-09-10 LAB — SARS CORONAVIRUS 2 (TAT 6-24 HRS): SARS Coronavirus 2: NEGATIVE

## 2019-09-12 ENCOUNTER — Inpatient Hospital Stay (HOSPITAL_COMMUNITY)
Admission: RE | Admit: 2019-09-12 | Payer: Managed Care, Other (non HMO) | Source: Ambulatory Visit | Admitting: Orthopedic Surgery

## 2019-09-12 ENCOUNTER — Encounter (HOSPITAL_COMMUNITY): Admission: RE | Payer: Self-pay | Source: Ambulatory Visit

## 2019-09-12 SURGERY — ANTERIOR LATERAL LUMBAR FUSION 2 LEVELS
Anesthesia: General

## 2019-09-13 ENCOUNTER — Encounter: Payer: Self-pay | Admitting: Internal Medicine

## 2019-09-13 ENCOUNTER — Encounter (HOSPITAL_COMMUNITY): Admission: RE | Payer: Self-pay | Source: Ambulatory Visit

## 2019-09-13 ENCOUNTER — Inpatient Hospital Stay (HOSPITAL_COMMUNITY)
Admission: RE | Admit: 2019-09-13 | Payer: Managed Care, Other (non HMO) | Source: Ambulatory Visit | Admitting: Orthopedic Surgery

## 2019-09-13 SURGERY — POSTERIOR LUMBAR FUSION 3 LEVEL
Anesthesia: General

## 2019-09-25 ENCOUNTER — Encounter (INDEPENDENT_AMBULATORY_CARE_PROVIDER_SITE_OTHER): Payer: Self-pay | Admitting: Internal Medicine

## 2019-09-26 ENCOUNTER — Other Ambulatory Visit (INDEPENDENT_AMBULATORY_CARE_PROVIDER_SITE_OTHER): Payer: Self-pay | Admitting: Internal Medicine

## 2019-09-26 MED ORDER — METFORMIN HCL 1000 MG PO TABS: 1000.0000 mg | ORAL_TABLET | Freq: Two times a day (BID) | ORAL | 0 refills | Status: DC

## 2019-10-15 ENCOUNTER — Other Ambulatory Visit (INDEPENDENT_AMBULATORY_CARE_PROVIDER_SITE_OTHER): Payer: Self-pay | Admitting: Internal Medicine

## 2019-10-15 MED ORDER — ROSUVASTATIN CALCIUM 20 MG PO TABS: 20.0000 mg | ORAL_TABLET | Freq: Every day | ORAL | 1 refills | Status: DC

## 2019-10-18 ENCOUNTER — Ambulatory Visit (INDEPENDENT_AMBULATORY_CARE_PROVIDER_SITE_OTHER): Payer: Managed Care, Other (non HMO) | Admitting: Internal Medicine

## 2019-10-18 ENCOUNTER — Other Ambulatory Visit: Payer: Self-pay

## 2019-10-18 ENCOUNTER — Encounter: Payer: Self-pay | Admitting: Internal Medicine

## 2019-10-18 ENCOUNTER — Other Ambulatory Visit (INDEPENDENT_AMBULATORY_CARE_PROVIDER_SITE_OTHER): Payer: Managed Care, Other (non HMO)

## 2019-10-18 VITALS — BP 130/70 | HR 80 | Ht 64.0 in | Wt 205.0 lb

## 2019-10-18 DIAGNOSIS — E039 Hypothyroidism, unspecified: Secondary | ICD-10-CM | POA: Diagnosis not present

## 2019-10-18 DIAGNOSIS — E119 Type 2 diabetes mellitus without complications: Secondary | ICD-10-CM

## 2019-10-18 DIAGNOSIS — E1165 Type 2 diabetes mellitus with hyperglycemia: Secondary | ICD-10-CM

## 2019-10-18 LAB — POCT GLYCOSYLATED HEMOGLOBIN (HGB A1C): Hemoglobin A1C: 7.7 % — AB (ref 4.0–5.6)

## 2019-10-18 LAB — T3, FREE: T3, Free: 4.9 pg/mL — ABNORMAL HIGH (ref 2.3–4.2)

## 2019-10-18 LAB — TSH: TSH: 0.5 u[IU]/mL (ref 0.35–4.50)

## 2019-10-18 LAB — T4, FREE: Free T4: 0.8 ng/dL (ref 0.60–1.60)

## 2019-10-18 MED ORDER — LEVEMIR FLEXTOUCH 100 UNIT/ML ~~LOC~~ SOPN: 50.0000 [IU] | PEN_INJECTOR | Freq: Every day | SUBCUTANEOUS | 3 refills | Status: DC

## 2019-10-18 MED ORDER — TRULICITY 3 MG/0.5ML ~~LOC~~ SOAJ: 3.0000 mg | SUBCUTANEOUS | 3 refills | Status: DC

## 2019-10-18 NOTE — Patient Instructions (Addendum)
Please continue: - Metformin 2000 mg at night - Glipizide 5 mg 2x a day before 1st and last meal of the day  - Trulicity 3 mg weekly - Levemir 50 units at bedtime  Please continue NP thyroid 90 mg + 15 mg in a.m.  Take the thyroid hormone every day, with water, at least 30 minutes before breakfast, separated by at least 4 hours from: - acid reflux medications - calcium - iron - multivitamins  Please stop at the lab.  Please return in 3-4 months with your sugar log

## 2019-10-18 NOTE — Progress Notes (Signed)
Patient ID: SUMMIT STRONACH, female   DOB: [birth year], 64 y.o.   MRN: [id number]   This visit occurred during the SARS-CoV-2 public health emergency.  Safety protocols were in place, including screening questions prior to the visit, additional usage of staff PPE, and extensive cleaning of exam room while observing appropriate contact time as indicated for disinfecting solutions.   HPI: REACE FARQUHAR is a 64 y.o.-year-old female, initially referred by her PCP, Dr. Anastasio Champion, returning for follow-up for DM2, dx in 07/2008, insulin-dependent since 2012, then off, restarted 2020, uncontrolled, without long-term complications.  Last visit 3 months ago.  She did not have back surgery yet, although her HbA1c is now lower than 8%, due to problems with her insurance. This was moved to the end of July (son's wedding, conference - this summer).  Since last OV, she had dietary indiscretions >> sugars higher. Since 2 days ago >> stopped snacking.  DM2: Reviewed HbA1c levels: Lab Results  Component Value Date   HGBA1C 7.0 (A) 07/19/2019   HGBA1C 8.7 (H) 04/03/2019   HGBA1C 8.4 (H) 02/20/2019   HGBA1C 7.9 (H) 01/24/2017   Pt is on a regimen of: - Metformin 1000 mg 2x a day, with meals >> 2000 mg with dinner - Glipizide 5 mg 2x a day with meals, moved before meals >> 5 mg before dinner >> 5 mg bid - Trulicity 1.5 mg weekly >> 3 mg weekly (increased 03/2019) - Levemir 40 >> 50 units at bedtime She tried Invokana >> yeast infections. She was on Januvia-stopped 03/2019  Pt checks her sugars 0-2 times a day-higher: - am: 120, 170-200, 278 >> 86, 166, 200, 332 >> 122-300 - 2h after b'fast: n/c >> 249 >> n/c - before lunch: n/c >> 132-215, 276 >> 171-219 - 2h after lunch: n/c >> 159-208, 220 >> 208-283 - before dinner: n/c >> 53, 77-169 >> 96  - 2h after dinner: n/c >> 62, 99, 129, 222 >> 204, 282 - bedtime: n/c >> 131 >> 65, 159-215 - nighttime: n/c >> 126 Lowest sugar was 120 >> 53 >> 65; she has  hypoglycemia awareness at 90.  Highest sugar was 400 >> 332 >> 381.  Glucometer: [place]  Pt's meals are: - Breakfast: coffee, occasionally eggs - Lunch: tuna salad - Dinner: chicken, rice/potato, salad - Snacks: 2 in the evening She is doing intermittent fasting -fasting window is in the morning.  She takes glipizide occasionally in the morning but mostly before lunch. She is walking and gardening several times a week.  -No CKD, last BUN/creatinine:  Lab Results  Component Value Date   BUN 7 (L) 09/05/2019   BUN 7 02/20/2019   CREATININE 0.60 09/05/2019   CREATININE 0.60 02/20/2019  Not on ACE inhibitor/ARB.  -+ HL. No results found for: CHOL, HDL, LDLCALC, LDLDIRECT, TRIG, CHOLHDL On Crestor 20.  - last eye exam was in 2020: No DR. She has a history of cataract surgeries.  -She denies numbness and tingling in her feet. On Neurontin for L leg pain 2/2 back pain. She had 2 back surgeries so far.  Pt has FH of DM in paternal uncle.  Hypothyroidism  -Diagnosed "years" ago -On LT4, now on desiccated thyroid extract (NP thyroid) -She was previously on a very large dose of NP thyroid, 150 mg daily at last visit, with suppressed TSH (also with fatigue, subjective hyperthermia, insomnia).  She is currently on NP thyroid 90+15 (1/2 of a 30 mg tab)mg daily: - in am - fasting -  at least 30 min from b'fast - no Fe, MVI - + Calcium at dinnertime - + PPIs every other day, later in the day - not on Biotin  TFTs reviewed: Lab Results  Component Value Date   TSH 1.52 07/19/2019   TSH 2.16 05/22/2019   TSH 0.08 (L) 04/17/2019   TSH 0.05 (L) 02/20/2019   She also has an isthmic thyroid nodule 1.8 cm >> she had biopsy of this nodule in 2018: Benign.  Pt denies: - feeling nodules in neck - hoarseness - dysphagia - choking - SOB with lying down  Parents have thyroid ds.No FH of thyroid cancer. No FH of thyroid cancer. No h/o radiation tx to head or neck.   No herbal  supplements. No Biotin use. No recent steroids use.   She continues on testosterone compounded cream.  ROS: Constitutional: no weight gain/no weight loss, no fatigue, + subjective hyperthermia, no subjective hypothermia Eyes: no blurry vision, no xerophthalmia ENT: no sore throat, + see HPI Cardiovascular: no CP/no SOB/no palpitations/no leg swelling Respiratory: no cough/no SOB/no wheezing Gastrointestinal: no N/no V/no D/no C/no acid reflux Musculoskeletal: no muscle aches/no joint aches Skin: no rashes, no hair loss Neurological: no tremors/no numbness/no tingling/no dizziness  I reviewed pt's medications, allergies, PMH, social hx, family hx, and changes were documented in the history of present illness. Otherwise, unchanged from my initial visit note.  Past Medical History:  Diagnosis Date  . Anemia   . Arthritis   . Depression   . GERD (gastroesophageal reflux disease)   . HLD (hyperlipidemia) 02/20/2019  . Morbid obesity ([place]) 02/20/2019  . Primary hypothyroidism 02/20/2019  . RLS (restless legs syndrome)   . Shingles   . Sleep apnea   . Type II diabetes mellitus, uncontrolled ([place]) 02/20/2019  . Vitamin D deficiency disease 02/20/2019   Past Surgical History:  Procedure Laterality Date  . BACK SURGERY  2012, 2016   lumbar fusion 2012, cleaning out of area 2016  . BREAST BIOPSY Left 2010  . CHOLECYSTECTOMY  2011  . CLOSED REDUCTION ANKLE FRACTURE    . CLOSED REDUCTION HUMERUS FRACTURE Right 2014  . COLONOSCOPY WITH PROPOFOL N/A 02/21/2018   Procedure: COLONOSCOPY WITH PROPOFOL;  Surgeon: Danie Binder, MD;  [facility];  Service: Endoscopy;  Laterality: N/A;  1:15pm - pt knows to arrive at 10:30  . EYE SURGERY  2016   eye lid droop   . KNEE ARTHROSCOPY Right 2010  . POLYPECTOMY  02/21/2018   Procedure: POLYPECTOMY;  Surgeon: Danie Binder, MD;  [facility];  Service: Endoscopy;;  hepatic flexure polyp  . TOTAL KNEE ARTHROPLASTY Right 01/31/2017    Procedure: RIGHT TOTAL KNEE ARTHROPLASTY;  Surgeon: Paralee Cancel, MD;  Location: [facility];  Service: Orthopedics;  Laterality: Right;  90 mins   Social History   Socioeconomic History  . Marital status: Married    Spouse name: Not on file  . Number of children:  3  . Years of education: Not on file  . Highest education level: Not on file  Occupational History  .  + Homemaker, previously librarian  Social Needs  . Financial resource strain: Not on file  . Food insecurity    Worry: Not on file    Inability: Not on file  . Transportation needs    Medical: Not on file    Non-medical: Not on file  Tobacco Use  . Smoking status: Former Smoker    Packs/day: 1.00  Years: 8.00    Pack years: 8.00    Types: Cigarettes    Start date: 06/14/1970    Quit date: 06/14/1978    Years since quitting: 40.8  . Smokeless tobacco: Never Used  . Tobacco comment: quit age 72  Substance and Sexual Activity  . Alcohol use: Yes    Alcohol/week:  1-2 drinks daily    Varies       . Drug use: No   Current Outpatient Medications on File Prior to Visit  Medication Sig Dispense Refill  . Calcium Carb-Cholecalciferol (CALCIUM 600/VITAMIN D3 PO) Take 1 tablet by mouth daily.    . cetirizine (ZYRTEC) 10 MG tablet Take 10 mg by mouth daily as needed for allergies.    . Cholecalciferol (VITAMIN D-3) 125 MCG (5000 UT) TABS Take 10,000 Units by mouth daily at 12 noon.    . Dulaglutide (TRULICITY) 3 0000000 SOPN Inject 3 mg into the skin once a week. 4 pen 11  . furosemide (LASIX) 40 MG tablet TAKE 1 TABLET BY MOUTH EVERY DAY (Patient taking differently: Take 40 mg by mouth daily as needed for fluid or edema. ) 90 tablet 1  . glipiZIDE (GLUCOTROL) 5 MG tablet TAKE 1 TABLET BY MOUTH TWICE A DAY (Patient not taking: Reported on 09/03/2019) 180 tablet 1  . HYDROcodone-acetaminophen (NORCO/VICODIN) 5-325 MG tablet Take 1 tablet by mouth 4 (four) times daily as needed for pain.    Marland Kitchen ibuprofen (ADVIL) 200 MG tablet  Take 400 mg by mouth every 6 (six) hours as needed for mild pain.    . Insulin Detemir (LEVEMIR FLEXTOUCH) 100 UNIT/ML Pen INJECT 40 UNITS INTO THE SKIN DAILY. (Patient taking differently: Inject 50 Units into the skin at bedtime. ) 30 mL 1  . Magnesium 500 MG TABS Take 500 mg by mouth every evening.    . Melatonin 1 MG CAPS Take 1 mg by mouth at bedtime.     . metFORMIN (GLUCOPHAGE) 1000 MG tablet Take 1 tablet (1,000 mg total) by mouth 2 (two) times daily. 180 tablet 0  . NON FORMULARY Apply 1 application topically every 3 (three) days. Testosterone Compound Cream    . NP THYROID 30 MG tablet Take 0.5 tablets (15 mg total) by mouth daily before breakfast. Take half a tablet by mouth before breakfast. (Takes with 90mg  for total of 105mg ) 45 tablet 0  . NP THYROID 90 MG tablet Take 1 tablet (90 mg total) by mouth daily. 90 tablet 1  . omeprazole (PRILOSEC) 20 MG capsule Take 20 mg by mouth every other day.    Vladimir Faster Glycol-Propyl Glycol (SYSTANE FREE OP) Place 1 drop into both eyes every 4 (four) hours as needed (Dry eyes).    Marland Kitchen rOPINIRole (REQUIP) 3 MG tablet TAKE ONE TABLET BY MOUTH AT NIGHT (Patient taking differently: Take 3 mg by mouth at bedtime. ) 90 tablet 1  . rosuvastatin (CRESTOR) 20 MG tablet Take 1 tablet (20 mg total) by mouth daily. 90 tablet 1  . vitamin B-12 (CYANOCOBALAMIN) 1000 MCG tablet Take 1,000 mcg by mouth daily.    . [DISCONTINUED] furosemide (LASIX) 40 MG tablet Take 1 tablet (40 mg total) by mouth daily. (Patient taking differently: Take 40 mg by mouth daily as needed. ) 30 tablet 3  . [DISCONTINUED] glipiZIDE (GLUCOTROL) 5 MG tablet Take 1 tablet (5 mg total) by mouth daily before supper. 180 tablet 3  . [DISCONTINUED] Insulin Detemir (LEVEMIR) 100 UNIT/ML Pen Inject 40 Units into the skin daily. (  Patient not taking: Reported on 04/17/2019) 36 mL 1  . [DISCONTINUED] metFORMIN (GLUCOPHAGE) 1000 MG tablet Take 1 tablet (1,000 mg total) by mouth 2 (two) times daily. 180  tablet 0  . [DISCONTINUED] metFORMIN (GLUCOPHAGE) 1000 MG tablet TAKE 1 TABLET BY MOUTH TWICE A DAY 180 tablet 0  . [DISCONTINUED] rOPINIRole (REQUIP) 3 MG tablet TAKE ONE TABLET BY MOUTH AT NIGHT 30 tablet 3  . [DISCONTINUED] rosuvastatin (CRESTOR) 20 MG tablet Take 1 tablet (20 mg total) by mouth at bedtime. 90 tablet 0   No current facility-administered medications on file prior to visit.   Allergies  Allergen Reactions  . Sulfa Antibiotics Hives  . Ace Inhibitors Cough  . Invokana [Canagliflozin] Other (See Comments)    Yeast   . Vytorin [Ezetimibe-Simvastatin] Other (See Comments)    Leg cramps  . Atorvastatin Hives   Family History  Problem Relation Age of Onset  . Cancer Mother   . Heart disease Father   . Stroke Father   . Cancer Father   . Heart disease Brother   . Hyperlipidemia Brother   . Hyperlipidemia Brother   . Colon cancer Neg Hx     PE: BP 130/70   Pulse 80   Ht 5\' 4"  (1.626 m)   Wt 205 lb (93 kg)   SpO2 99%   BMI 35.19 kg/m  Wt Readings from Last 3 Encounters:  10/18/19 205 lb (93 kg)  09/05/19 204 lb (92.5 kg)  07/19/19 206 lb (93.4 kg)   Constitutional: overweight, in NAD Eyes: PERRLA, EOMI, no exophthalmos ENT: moist mucous membranes, no thyromegaly, no cervical lymphadenopathy Cardiovascular: RRR, No MRG Respiratory: CTA B Gastrointestinal: abdomen soft, NT, ND, BS+ Musculoskeletal: no deformities, strength intact in all 4 Skin: moist, warm, no rashes Neurological: no tremor with outstretched hands, DTR normal in all 4  ASSESSMENT: 1. DM2, insulin-dependent, uncontrolled, without long-term complications, but with hyperglycemia  2.  Hypothyroidism -Previously uncontrolled  PLAN:  1. Patient with longstanding, uncontrolled, type 2 diabetes, on oral antidiabetic regimen with Metformin, sulfonylurea, also daily long-acting insulin and weekly GLP-1 receptor agonist.  At last visit HbA1c was better, at 7%, meeting clearance criteria for back  surgery.  She did not have her back surgery yet, due to problems with insurance coverage.  At last visit, sugars were still fluctuating and she was still having dietary indiscretions at night, including cereals.  We discussed about improving her diet and we also move the entire metformin dose at night and decrease the dose of glipizide to only 1 tablet before dinner, while stopping glipizide in the morning since she was not eating breakfast.  Since last visit, sugars started to increase and she added back to morning glipizide.  She takes this before the first meal of the day. -Reviewing her sugar log, her CBGs are definitely higher compared to before and more fluctuating.  She attributes this to relaxing her diet due to disappointment over the fact that she had to cancel surgery.  However, she realized that she needed to improve her weight and her blood sugars and approximately 2 days ago she stopped snacking and is trying to improve her diet.  This morning, blood sugar was 122.  I advised her that she should definitely continue with dietary changes, which worked for her in the past, since last HbA1c was 7.0%.  However, I advised her to get in touch with me if the sugars do not improve within the next few weeks, and in that case we  may need to add mealtime insulin. - I suggested to:  Patient Instructions  Please continue: - Metformin 2000 mg at night - Glipizide 5 mg 2x a day before 1st and last meal of the day  - Trulicity 3 mg weekly - Levemir 50 units at bedtime  Please continue NP thyroid 90 mg + 15 mg in a.m.  Take the thyroid hormone every day, with water, at least 30 minutes before breakfast, separated by at least 4 hours from: - acid reflux medications - calcium - iron - multivitamins  Please stop at the lab.  Please return in 3-4 months with your sugar log  - we checked her HbA1c: 7.7% (higher) - advised to check sugars at different times of the day - 1-2x a day, rotating check  times - advised for yearly eye exams >> she is UTD - return to clinic in 3-4 months  2.  Hypothyroidism - latest thyroid labs reviewed with pt >> normal: Lab Results  Component Value Date   TSH 1.52 07/19/2019   - she continues on NP thyroid 90+15 mg daily - pt feels good on this dose, but continues to have hot flashes - we discussed about taking the thyroid hormone every day, with water, >30 minutes before breakfast, separated by >4 hours from acid reflux medications, calcium, iron, multivitamins. Pt. is taking it correctly. - will check thyroid tests now  Needs refills for the 15 mg NP.  Component     Latest Ref Rng & Units 10/18/2019  Triiodothyronine,Free,Serum     2.3 - 4.2 pg/mL 4.9 (H)  TSH     0.35 - 4.50 uIU/mL 0.50  T4,Free(Direct)     0.60 - 1.60 ng/dL 0.80  TSH is low in the normal range and her free T3 is high.  Since she is also complaining of hot flashes, will decrease the dose of NP thyroid to only 90 mg daily.  We will have her back for labs in 1.5 months.  Philemon Kingdom, MD PhD [facility]

## 2019-10-21 ENCOUNTER — Other Ambulatory Visit (INDEPENDENT_AMBULATORY_CARE_PROVIDER_SITE_OTHER): Payer: Self-pay | Admitting: Internal Medicine

## 2019-10-23 ENCOUNTER — Other Ambulatory Visit (INDEPENDENT_AMBULATORY_CARE_PROVIDER_SITE_OTHER): Payer: Self-pay | Admitting: Internal Medicine

## 2019-10-25 ENCOUNTER — Other Ambulatory Visit (HOSPITAL_COMMUNITY): Payer: Self-pay | Admitting: Internal Medicine

## 2019-10-25 DIAGNOSIS — Z1231 Encounter for screening mammogram for malignant neoplasm of breast: Secondary | ICD-10-CM

## 2019-10-30 ENCOUNTER — Other Ambulatory Visit (INDEPENDENT_AMBULATORY_CARE_PROVIDER_SITE_OTHER): Payer: Self-pay | Admitting: Internal Medicine

## 2019-10-30 ENCOUNTER — Telehealth (INDEPENDENT_AMBULATORY_CARE_PROVIDER_SITE_OTHER): Payer: Self-pay | Admitting: Internal Medicine

## 2019-10-30 MED ORDER — GLIPIZIDE 5 MG PO TABS: 5.0000 mg | ORAL_TABLET | Freq: Two times a day (BID) | ORAL | 1 refills | Status: DC

## 2019-11-01 NOTE — Telephone Encounter (Signed)
Done

## 2019-11-22 ENCOUNTER — Ambulatory Visit (INDEPENDENT_AMBULATORY_CARE_PROVIDER_SITE_OTHER): Payer: Managed Care, Other (non HMO) | Admitting: Internal Medicine

## 2019-11-26 ENCOUNTER — Encounter (INDEPENDENT_AMBULATORY_CARE_PROVIDER_SITE_OTHER): Payer: Self-pay | Admitting: Internal Medicine

## 2019-11-26 ENCOUNTER — Encounter: Payer: Self-pay | Admitting: Internal Medicine

## 2019-11-27 ENCOUNTER — Encounter (INDEPENDENT_AMBULATORY_CARE_PROVIDER_SITE_OTHER): Payer: Self-pay | Admitting: Internal Medicine

## 2019-11-27 ENCOUNTER — Ambulatory Visit (HOSPITAL_COMMUNITY)
Admission: RE | Admit: 2019-11-27 | Discharge: 2019-11-27 | Disposition: A | Discharge status: Home / Self Care | Payer: Managed Care, Other (non HMO) | Source: Ambulatory Visit | Attending: Internal Medicine | Admitting: Internal Medicine

## 2019-11-27 ENCOUNTER — Other Ambulatory Visit (INDEPENDENT_AMBULATORY_CARE_PROVIDER_SITE_OTHER): Payer: Self-pay | Admitting: Internal Medicine

## 2019-11-27 ENCOUNTER — Ambulatory Visit (INDEPENDENT_AMBULATORY_CARE_PROVIDER_SITE_OTHER): Payer: Managed Care, Other (non HMO) | Admitting: Internal Medicine

## 2019-11-27 ENCOUNTER — Other Ambulatory Visit: Payer: Self-pay

## 2019-11-27 VITALS — BP 126/80 | HR 82 | Temp 97.5°F | Resp 18 | Ht 65.0 in | Wt 206.2 lb

## 2019-11-27 DIAGNOSIS — E039 Hypothyroidism, unspecified: Secondary | ICD-10-CM

## 2019-11-27 DIAGNOSIS — E2839 Other primary ovarian failure: Secondary | ICD-10-CM | POA: Diagnosis not present

## 2019-11-27 DIAGNOSIS — E1165 Type 2 diabetes mellitus with hyperglycemia: Secondary | ICD-10-CM

## 2019-11-27 DIAGNOSIS — R0782 Intercostal pain: Secondary | ICD-10-CM | POA: Diagnosis not present

## 2019-11-27 MED ORDER — FIRST-TESTOSTERONE MC 2 % TD CREA: 5.0000 mg | TOPICAL_CREAM | Freq: Every day | TRANSDERMAL | 0 refills | Status: DC

## 2019-11-27 MED ORDER — TRAMADOL HCL 50 MG PO TABS: 50.0000 mg | ORAL_TABLET | Freq: Three times a day (TID) | ORAL | 0 refills | Status: DC | PRN

## 2019-11-27 NOTE — Progress Notes (Signed)
Metrics: Intervention Frequency ACO  Documented Smoking Status Yearly  Screened one or more times in 24 months  Cessation Counseling or  Active cessation medication Past 24 months  Past 24 months   Guideline developer: UpToDate (See UpToDate for funding source) Date Released: 2014       Wellness Office Visit  Subjective:  Patient ID: Megan Church, female    DOB: 05-07-1956  Age: 64 y.o. MRN: [id number]  CC: This lady comes in for follow-up regarding her diabetes, hypothyroidism and testosterone therapy. HPI  She has been taking testosterone therapy intermittently and she tells me that she applies testosterone every 3 days.  I am not sure why she is doing this- I think that she may have had side effects in the past. She is seeing endocrinology for management of her type 2 diabetes and hypothyroidism. She is thinking about a plant-based diet now.  She does do intermittent fasting but not consistently. About 5 days ago, she had a fall onto grass, involving dogs.  She appears to have injured her right side of her chest.  She has pain on deep breathing.  She cannot sleep because of the pain.  She denies any cough or hemoptysis or fever. Past Medical History:  Diagnosis Date  . Anemia   . Arthritis   . Depression   . GERD (gastroesophageal reflux disease)   . HLD (hyperlipidemia) 02/20/2019  . Morbid obesity ([place]) 02/20/2019  . Primary hypothyroidism 02/20/2019  . RLS (restless legs syndrome)   . Shingles   . Sleep apnea   . Type II diabetes mellitus, uncontrolled ([place]) 02/20/2019  . Vitamin D deficiency disease 02/20/2019   Past Surgical History:  Procedure Laterality Date  . BACK SURGERY  2012, 2016   lumbar fusion 2012, cleaning out of area 2016  . BREAST BIOPSY Left 2010  . CHOLECYSTECTOMY  2011  . CLOSED REDUCTION ANKLE FRACTURE    . CLOSED REDUCTION HUMERUS FRACTURE Right 2014  . COLONOSCOPY WITH PROPOFOL N/A 02/21/2018   Procedure: COLONOSCOPY WITH PROPOFOL;  Surgeon: Danie Binder, MD;  [facility];  Service: Endoscopy;  Laterality: N/A;  1:15pm - pt knows to arrive at 10:30  . EYE SURGERY  2016   eye lid droop   . KNEE ARTHROSCOPY Right 2010  . POLYPECTOMY  02/21/2018   Procedure: POLYPECTOMY;  Surgeon: Danie Binder, MD;  [facility];  Service: Endoscopy;;  hepatic flexure polyp  . TOTAL KNEE ARTHROPLASTY Right 01/31/2017   Procedure: RIGHT TOTAL KNEE ARTHROPLASTY;  Surgeon: Paralee Cancel, MD;  [facility];  Service: Orthopedics;  Laterality: Right;  90 mins     Family History  Problem Relation Age of Onset  . Cancer Mother   . Heart disease Father   . Stroke Father   . Cancer Father   . Heart disease Brother   . Hyperlipidemia Brother   . Hyperlipidemia Brother   . Colon cancer Neg Hx     Social History   Social History Narrative   Married for 42 years.Moved from [place] in December as husband found new job.Used to be a librarian,retired.Husband a Health visitor from [place] retired.   Social History   Tobacco Use  . Smoking status: Former Smoker    Packs/day: 1.00    Years: 8.00    Pack years: 8.00    Types: Cigarettes    Start date: 06/14/1970    Quit date: 06/14/1978    Years since quitting: 41.4  .  Smokeless tobacco: Never Used  . Tobacco comment: quit age 21  Substance Use Topics  . Alcohol use: Yes    Alcohol/week: 14.0 standard drinks    Types: 7 Glasses of wine, 7 Shots of liquor per week    Current Meds  Medication Sig  . Calcium Carb-Cholecalciferol (CALCIUM 600/VITAMIN D3 PO) Take 1 tablet by mouth daily.  . Cholecalciferol (VITAMIN D-3) 125 MCG (5000 UT) TABS Take 10,000 Units by mouth daily at 12 noon.  . Dulaglutide (TRULICITY) 3 VE/9.3YB SOPN Inject 3 mg into the skin once a week.  . furosemide (LASIX) 40 MG tablet Take 1 tablet (40 mg total) by mouth daily.  Marland Kitchen glipiZIDE (GLUCOTROL) 5 MG tablet Take 1 tablet (5 mg total) by mouth 2 (two) times daily.  Marland Kitchen  HYDROcodone-acetaminophen (NORCO/VICODIN) 5-325 MG tablet Take 1 tablet by mouth 4 (four) times daily as needed for pain.  Marland Kitchen ibuprofen (ADVIL) 200 MG tablet Take 400 mg by mouth every 6 (six) hours as needed for mild pain.  Marland Kitchen insulin detemir (LEVEMIR FLEXTOUCH) 100 UNIT/ML FlexPen Inject 50 Units into the skin at bedtime.  . Magnesium 500 MG TABS Take 500 mg by mouth every evening.  . Melatonin 1 MG CAPS Take 1 mg by mouth at bedtime.   . metFORMIN (GLUCOPHAGE) 1000 MG tablet Take 1 tablet (1,000 mg total) by mouth 2 (two) times daily.  . NON FORMULARY Apply 1 application topically every 3 (three) days. Testosterone Compound Cream  . NP THYROID 90 MG tablet Take 1 tablet (90 mg total) by mouth daily.  Marland Kitchen omeprazole (PRILOSEC) 20 MG capsule Take 20 mg by mouth every other day.  Vladimir Faster Glycol-Propyl Glycol (SYSTANE FREE OP) Place 1 drop into both eyes every 4 (four) hours as needed (Dry eyes).  Marland Kitchen rOPINIRole (REQUIP) 3 MG tablet TAKE ONE TABLET BY MOUTH AT NIGHT (Patient taking differently: Take 3 mg by mouth at bedtime. )  . rosuvastatin (CRESTOR) 20 MG tablet Take 1 tablet (20 mg total) by mouth daily.  . Testosterone Propionate (FIRST-TESTOSTERONE MC) 2 % CREA Place 5 mg onto the skin daily.  . vitamin B-12 (CYANOCOBALAMIN) 1000 MCG tablet Take 1,000 mcg by mouth daily.      No flowsheet data found.   Objective:   Today's Vitals: BP 126/80 (BP Location: Left Arm, Patient Position: Sitting, Cuff Size: Normal)   Pulse 82   Temp (!) 97.5 F (36.4 C) (Temporal)   Resp 18   Ht 5\' 5"  (1.651 m)   Wt 206 lb 3.2 oz (93.5 kg)   SpO2 98%   BMI 34.31 kg/m  Vitals with BMI 11/27/2019 10/18/2019 09/05/2019  Height 5\' 5"  5\' 4"  5' 3.75"  Weight 206 lbs 3 oz 205 lbs 204 lbs  BMI 34.31 01.75 10.2  Systolic 585 277 824  Diastolic 80 70 81  Pulse 82 80 99     Physical Exam   She does not appear to be in acute pain.  She remains obese.  She does have point tenderness in the right lateral  chest wall around the region of ribs #7-9.  She may well have a fracture.  Lung fields are entirely clear with no bronchial breathing, pleural rub or wheezing.   Assessment   1. Primary hypothyroidism   2. Uncontrolled type 2 diabetes mellitus with hyperglycemia ([place])   3. Intercostal pain   4. Primary ovarian failure       Tests ordered Orders Placed This Encounter  Procedures  . DG  Chest 2 View  . T3, free  . T4  . TSH     Plan: 1. She will continue with the same dose of reduced desiccated NP thyroid 90 mg daily and we will check thyroid function tests. 2. She will continue with her diabetic medications which are being managed by endocrinology. 3. As far as the right-sided chest pain is concerned, I will send for chest x-ray and I have given her prescription for tramadol for pain relief.  I have also told that she can take over-the-counter ibuprofen at a dose of 600 mg 3 times a day always with food for maximum of 5 days. 4. As far as testosterone therapy is concerned, I have called in a prescription for testosterone cream, compounded, 5 mg apply to the labia every day.  I have told her to take the testosterone every other day which is an increase in what she was doing previously and she may progress to every day.  She has noticed that testosterone does help with sex drive and energy. 5. I will see her in about 6 weeks time for follow-up and we will check testosterone levels then.  I did encourage to eat a plant-based diet and we discussed this at some length.   Meds ordered this encounter  Medications  . traMADol (ULTRAM) 50 MG tablet    Sig: Take 1 tablet (50 mg total) by mouth every 8 (eight) hours as needed for up to 10 days.    Dispense:  30 tablet    Refill:  0    Hanin Decook Luther Parody, MD

## 2019-11-28 ENCOUNTER — Encounter: Payer: Self-pay | Admitting: Internal Medicine

## 2019-11-28 LAB — TSH: TSH: 3.08 mIU/L (ref 0.40–4.50)

## 2019-11-28 LAB — T4: T4, Total: 7.8 ug/dL (ref 5.1–11.9)

## 2019-11-28 LAB — T3, FREE: T3, Free: 4.9 pg/mL — ABNORMAL HIGH (ref 2.3–4.2)

## 2019-11-30 ENCOUNTER — Other Ambulatory Visit: Payer: Managed Care, Other (non HMO)

## 2019-12-03 ENCOUNTER — Other Ambulatory Visit (INDEPENDENT_AMBULATORY_CARE_PROVIDER_SITE_OTHER): Payer: Self-pay | Admitting: Internal Medicine

## 2019-12-03 DIAGNOSIS — E119 Type 2 diabetes mellitus without complications: Secondary | ICD-10-CM

## 2019-12-03 MED ORDER — LEVEMIR FLEXTOUCH 100 UNIT/ML ~~LOC~~ SOPN: 50.0000 [IU] | PEN_INJECTOR | Freq: Every day | SUBCUTANEOUS | 3 refills | Status: DC

## 2019-12-03 MED ORDER — METFORMIN HCL 1000 MG PO TABS: 1000.0000 mg | ORAL_TABLET | Freq: Two times a day (BID) | ORAL | 0 refills | Status: DC

## 2019-12-05 ENCOUNTER — Other Ambulatory Visit (INDEPENDENT_AMBULATORY_CARE_PROVIDER_SITE_OTHER): Payer: Self-pay | Admitting: Internal Medicine

## 2019-12-05 MED ORDER — METFORMIN HCL 1000 MG PO TABS: 1000.0000 mg | ORAL_TABLET | Freq: Two times a day (BID) | ORAL | 0 refills | Status: DC

## 2019-12-10 ENCOUNTER — Ambulatory Visit (HOSPITAL_COMMUNITY)
Admission: RE | Admit: 2019-12-10 | Discharge: 2019-12-10 | Disposition: A | Discharge status: Home / Self Care | Payer: Managed Care, Other (non HMO) | Source: Ambulatory Visit | Attending: Internal Medicine | Admitting: Internal Medicine

## 2019-12-10 ENCOUNTER — Other Ambulatory Visit: Payer: Self-pay

## 2019-12-10 DIAGNOSIS — Z1231 Encounter for screening mammogram for malignant neoplasm of breast: Secondary | ICD-10-CM | POA: Insufficient documentation

## 2019-12-11 ENCOUNTER — Encounter (INDEPENDENT_AMBULATORY_CARE_PROVIDER_SITE_OTHER): Payer: Self-pay | Admitting: Internal Medicine

## 2019-12-21 ENCOUNTER — Other Ambulatory Visit: Payer: Self-pay | Admitting: Internal Medicine

## 2020-01-02 ENCOUNTER — Encounter (INDEPENDENT_AMBULATORY_CARE_PROVIDER_SITE_OTHER): Payer: Self-pay | Admitting: Internal Medicine

## 2020-01-08 ENCOUNTER — Encounter (INDEPENDENT_AMBULATORY_CARE_PROVIDER_SITE_OTHER): Payer: Self-pay | Admitting: Internal Medicine

## 2020-01-08 ENCOUNTER — Ambulatory Visit (INDEPENDENT_AMBULATORY_CARE_PROVIDER_SITE_OTHER): Payer: Managed Care, Other (non HMO) | Admitting: Internal Medicine

## 2020-01-08 ENCOUNTER — Other Ambulatory Visit: Payer: Self-pay

## 2020-01-08 VITALS — BP 105/70 | HR 86 | Temp 97.0°F | Ht 64.0 in | Wt 201.2 lb

## 2020-01-08 DIAGNOSIS — E119 Type 2 diabetes mellitus without complications: Secondary | ICD-10-CM | POA: Diagnosis not present

## 2020-01-08 DIAGNOSIS — E039 Hypothyroidism, unspecified: Secondary | ICD-10-CM | POA: Diagnosis not present

## 2020-01-08 DIAGNOSIS — E2839 Other primary ovarian failure: Secondary | ICD-10-CM

## 2020-01-08 MED ORDER — NP THYROID 90 MG PO TABS: 90.0000 mg | ORAL_TABLET | Freq: Every day | ORAL | 1 refills | Status: DC

## 2020-01-08 NOTE — Progress Notes (Signed)
Metrics: Intervention Frequency ACO  Documented Smoking Status Yearly  Screened one or more times in 24 months  Cessation Counseling or  Active cessation medication Past 24 months  Past 24 months   Guideline developer: UpToDate (See UpToDate for funding source) Date Released: 2014       Wellness Office Visit  Subjective:  Patient ID: Megan Church, female    DOB: 11-23-1955  Age: 64 y.o. MRN: [id number]  CC: This lady comes in for follow-up of diabetes, hypothyroidism, menopausal symptoms. HPI Overall, she is doing well.  She had gone to a convention in picked up a viral infection and she is now recovering.  She did have COVID-19 testing and it was negative.  She has been fully vaccinated with COVID-19 vaccine. She is seeing endocrinology for diabetes.  She is trying to eat healthier and is working towards a plant-based diet. I did prescribe testosterone cream for previously and she has not really started taking it on a regular basis yet.  She does describe some hot flashes.  Past Medical History:  Diagnosis Date  . Anemia   . Arthritis   . Depression   . GERD (gastroesophageal reflux disease)   . HLD (hyperlipidemia) 02/20/2019  . Morbid obesity ([place]) 02/20/2019  . Primary hypothyroidism 02/20/2019  . RLS (restless legs syndrome)   . Shingles   . Sleep apnea   . Type II diabetes mellitus, uncontrolled ([place]) 02/20/2019  . Vitamin D deficiency disease 02/20/2019   Past Surgical History:  Procedure Laterality Date  . BACK SURGERY  2012, 2016   lumbar fusion 2012, cleaning out of area 2016  . BREAST BIOPSY Left 2010  . CHOLECYSTECTOMY  2011  . CLOSED REDUCTION ANKLE FRACTURE    . CLOSED REDUCTION HUMERUS FRACTURE Right 2014  . COLONOSCOPY WITH PROPOFOL N/A 02/21/2018   Procedure: COLONOSCOPY WITH PROPOFOL;  Surgeon: Danie Binder, MD;  [facility];  Service: Endoscopy;  Laterality: N/A;  1:15pm - pt knows to arrive at 10:30  . EYE SURGERY  2016   eye lid droop   .  KNEE ARTHROSCOPY Right 2010  . POLYPECTOMY  02/21/2018   Procedure: POLYPECTOMY;  Surgeon: Danie Binder, MD;  [facility];  Service: Endoscopy;;  hepatic flexure polyp  . TOTAL KNEE ARTHROPLASTY Right 01/31/2017   Procedure: RIGHT TOTAL KNEE ARTHROPLASTY;  Surgeon: Paralee Cancel, MD;  Location: [facility];  Service: Orthopedics;  Laterality: Right;  90 mins     Family History  Problem Relation Age of Onset  . Cancer Mother   . Heart disease Father   . Stroke Father   . Cancer Father   . Heart disease Brother   . Hyperlipidemia Brother   . Hyperlipidemia Brother   . Colon cancer Neg Hx     Social History   Social History Narrative   Married for 42 years.Moved from [place] in December as husband found new job.Used to be a librarian,retired.Husband a Health visitor from [place] retired.   Social History   Tobacco Use  . Smoking status: Former Smoker    Packs/day: 1.00    Years: 8.00    Pack years: 8.00    Types: Cigarettes    Start date: 06/14/1970    Quit date: 06/14/1978    Years since quitting: 41.5  . Smokeless tobacco: Never Used  . Tobacco comment: quit age 66  Substance Use Topics  . Alcohol use: Yes    Alcohol/week: 14.0 standard drinks    Types:  7 Glasses of wine, 7 Shots of liquor per week    Current Meds  Medication Sig  . Calcium Carb-Cholecalciferol (CALCIUM 600/VITAMIN D3 PO) Take 1 tablet by mouth daily.  . Cholecalciferol (VITAMIN D-3) 125 MCG (5000 UT) TABS Take 10,000 Units by mouth daily at 12 noon.  . Dulaglutide (TRULICITY) 3 QZ/3.0QT SOPN Inject 3 mg into the skin once a week.  . furosemide (LASIX) 40 MG tablet Take 1 tablet (40 mg total) by mouth daily.  Marland Kitchen glipiZIDE (GLUCOTROL) 5 MG tablet Take 1 tablet (5 mg total) by mouth 2 (two) times daily.  Marland Kitchen HYDROcodone-acetaminophen (NORCO/VICODIN) 5-325 MG tablet Take 1 tablet by mouth 4 (four) times daily as needed for pain.  Marland Kitchen ibuprofen (ADVIL) 200 MG tablet Take 400 mg by mouth every  6 (six) hours as needed for mild pain.  Marland Kitchen insulin detemir (LEVEMIR FLEXTOUCH) 100 UNIT/ML FlexPen Inject 50 Units into the skin at bedtime.  . Magnesium 500 MG TABS Take 500 mg by mouth every evening.  . Melatonin 1 MG CAPS Take 1 mg by mouth at bedtime.   . metFORMIN (GLUCOPHAGE) 1000 MG tablet Take 1 tablet (1,000 mg total) by mouth 2 (two) times daily.  . NON FORMULARY Apply 1 application topically every 3 (three) days. Testosterone Compound Cream  . NP THYROID 90 MG tablet Take 1 tablet (90 mg total) by mouth daily.  Marland Kitchen omeprazole (PRILOSEC) 20 MG capsule Take 20 mg by mouth every other day.  Vladimir Faster Glycol-Propyl Glycol (SYSTANE FREE OP) Place 1 drop into both eyes every 4 (four) hours as needed (Dry eyes).  Marland Kitchen rOPINIRole (REQUIP) 3 MG tablet TAKE ONE TABLET BY MOUTH AT NIGHT (Patient taking differently: Take 3 mg by mouth at bedtime. )  . rosuvastatin (CRESTOR) 20 MG tablet Take 1 tablet (20 mg total) by mouth daily.  . Testosterone Propionate (FIRST-TESTOSTERONE MC) 2 % CREA Place 5 mg onto the skin daily.  . vitamin B-12 (CYANOCOBALAMIN) 1000 MCG tablet Take 1,000 mcg by mouth daily.  . [DISCONTINUED] NP THYROID 90 MG tablet TAKE 1 TABLET BY MOUTH EVERY DAY      No flowsheet data found.   Objective:   Today's Vitals: BP 105/70 (BP Location: Left Arm, Patient Position: Sitting, Cuff Size: Normal)   Pulse 86   Temp (!) 97 F (36.1 C) (Temporal)   Ht 5\' 4"  (1.626 m)   Wt 201 lb 3.2 oz (91.3 kg)   SpO2 97%   BMI 34.54 kg/m  Vitals with BMI 01/08/2020 11/27/2019 10/18/2019  Height 5\' 4"  5\' 5"  5\' 4"   Weight 201 lbs 3 oz 206 lbs 3 oz 205 lbs  BMI 34.52 62.26 33.35  Systolic 456 256 389  Diastolic 70 80 70  Pulse 86 82 80     Physical Exam  She looks systemically well.  She has lost 5 pounds since the last visit.  Blood pressure is excellent.     Assessment   1. Diabetes mellitus without complication ([place])   2. Primary hypothyroidism   3. Primary ovarian failure        Tests ordered No orders of the defined types were placed in this encounter.    Plan: 1. She will continue with glipizide and Metformin for diabetes and her hemoglobin A1c is less than 8% now and I suspect will improve as she continues to focus on a more of a plant-based diet. 2. She will continue with desiccated NP thyroid and I have refilled this today. 3. She  will restart/start again testosterone cream and we will check levels the next visit. 4. Follow-up in December for an annual physical exam.   Meds ordered this encounter  Medications  . NP THYROID 90 MG tablet    Sig: Take 1 tablet (90 mg total) by mouth daily.    Dispense:  90 tablet    Refill:  1    Brittlyn Cloe Luther Parody, MD

## 2020-01-10 ENCOUNTER — Other Ambulatory Visit (INDEPENDENT_AMBULATORY_CARE_PROVIDER_SITE_OTHER): Payer: Self-pay | Admitting: Internal Medicine

## 2020-01-15 ENCOUNTER — Other Ambulatory Visit (INDEPENDENT_AMBULATORY_CARE_PROVIDER_SITE_OTHER): Payer: Self-pay | Admitting: Internal Medicine

## 2020-01-22 ENCOUNTER — Other Ambulatory Visit (INDEPENDENT_AMBULATORY_CARE_PROVIDER_SITE_OTHER): Payer: Self-pay | Admitting: Internal Medicine

## 2020-01-22 MED ORDER — THYROID 30 MG PO TABS: 90.0000 mg | ORAL_TABLET | Freq: Every day | ORAL | 1 refills | Status: DC

## 2020-01-22 MED ORDER — ROSUVASTATIN CALCIUM 20 MG PO TABS: 20.0000 mg | ORAL_TABLET | Freq: Every day | ORAL | 1 refills | Status: DC

## 2020-02-14 ENCOUNTER — Other Ambulatory Visit: Payer: Self-pay

## 2020-02-14 ENCOUNTER — Ambulatory Visit (INDEPENDENT_AMBULATORY_CARE_PROVIDER_SITE_OTHER): Payer: Managed Care, Other (non HMO) | Admitting: Internal Medicine

## 2020-02-14 ENCOUNTER — Encounter: Payer: Self-pay | Admitting: Internal Medicine

## 2020-02-14 VITALS — BP 130/60 | HR 88 | Ht 64.0 in | Wt 204.0 lb

## 2020-02-14 DIAGNOSIS — E782 Mixed hyperlipidemia: Secondary | ICD-10-CM | POA: Diagnosis not present

## 2020-02-14 DIAGNOSIS — E119 Type 2 diabetes mellitus without complications: Secondary | ICD-10-CM | POA: Diagnosis not present

## 2020-02-14 DIAGNOSIS — E039 Hypothyroidism, unspecified: Secondary | ICD-10-CM | POA: Diagnosis not present

## 2020-02-14 LAB — LIPID PANEL
Cholesterol: 159 mg/dL (ref 0–200)
HDL: 38.6 mg/dL — ABNORMAL LOW (ref >39.00)
NonHDL: 120.07
Total CHOL/HDL Ratio: 4
Triglycerides: 204 mg/dL — ABNORMAL HIGH (ref 0.0–149.0)
VLDL: 40.8 mg/dL — ABNORMAL HIGH (ref 0.0–40.0)

## 2020-02-14 LAB — LDL CHOLESTEROL, DIRECT: Direct LDL: 96 mg/dL

## 2020-02-14 LAB — POCT GLYCOSYLATED HEMOGLOBIN (HGB A1C): Hemoglobin A1C: 7.2 % — AB (ref 4.0–5.6)

## 2020-02-14 LAB — T3, FREE: T3, Free: 4.1 pg/mL (ref 2.3–4.2)

## 2020-02-14 LAB — TSH: TSH: 4.14 u[IU]/mL (ref 0.35–4.50)

## 2020-02-14 LAB — T4, FREE: Free T4: 0.78 ng/dL (ref 0.60–1.60)

## 2020-02-14 NOTE — Progress Notes (Signed)
Patient ID: Megan Church, female   DOB: 08/23/1955, 64 y.o.   MRN: [id number]   This visit occurred during the SARS-CoV-2 public health emergency.  Safety protocols were in place, including screening questions prior to the visit, additional usage of staff PPE, and extensive cleaning of exam room while observing appropriate contact time as indicated for disinfecting solutions.   HPI: Megan Church is a 64 y.o.-year-old female, initially referred by her PCP, Dr. Anastasio Champion, returning for follow-up for DM2, dx in 07/2008, insulin-dependent since 2012, then off, restarted 2020, uncontrolled, without long-term complications.  Last visit 4 months ago.  She had her son's wedding this summer. She is preparing for back surgery. For this, her HbA1c has to be lower than 8%.  DM2: The HbA1c levels: Lab Results  Component Value Date   HGBA1C 7.7 (A) 10/18/2019   HGBA1C 7.0 (A) 07/19/2019   HGBA1C 8.7 (H) 04/03/2019   HGBA1C 8.4 (H) 02/20/2019   HGBA1C 7.9 (H) 01/24/2017   Pt is on a regimen of: - Metformin 1000 mg 2x a day, with meals >> 2000 mg with dinner - Glipizide 5 mg 2x a day with meals, moved before meals >> 5 mg before dinner >> 5 mg twice a day - Trulicity 1.5 mg weekly >> 3 mg weekly (increased 03/2019) - Levemir 40 >> 50 units at bedtime She tried Invokana >> yeast infections. She was on Januvia-stopped 03/2019  Pt checks her sugars 0-2 times a day: - am: 86, 166, 200, 332 >> 122-300 >> > 114-172, 183, 226 - 2h after b'fast: n/c >> 249 >> n/c >> 133, 269 - before lunch: n/c >> 132-215, 276 >> 171-219 > - 2h after lunch: n/c >> 159-208, 220 >> 208-283 >> 146-338 (!) - before dinner: n/c >> 53, 77-169 >> 96 >> 127, 144 - 2h after dinner: n/c >> 62, 99, 129, 222 >> 204, 282 >> 116, 165 - bedtime: n/c >> 131 >> 65, 159-215 >> 133 - nighttime: n/c >> 126 Lowest sugar was 120 >> 53 >> 65 >> 114; she has hypoglycemia awareness at 90.  Highest sugar was 400 >> 332 >> 381 >>  338.  Glucometer: [place]  Pt's meals are: - Breakfast: coffee, occasionally eggs - Lunch: tuna salad - Dinner: chicken, rice/potato, salad - Snacks: 2 in the evening She is doing intermittent fasting -fasting window is in the morning. She takes glipizide occasionally in the morning but mostly before lunch She is walking and gardening several times a week.  -No CKD, last BUN/creatinine:  Lab Results  Component Value Date   BUN 7 (L) 09/05/2019   BUN 7 02/20/2019   CREATININE 0.60 09/05/2019   CREATININE 0.60 02/20/2019  Not on ACE inhibitor/ARB.  -+ HL. No results found for: CHOL, HDL, LDLCALC, LDLDIRECT, TRIG, CHOLHDL On Crestor 20.  - last eye exam was in 2021: No DR reportedly. She has a history of cataract surgeries.  -No numbness and tingling in her feet. On Neurontin for L leg pain 2/2 back pain. She had two back surgeries in the past.  Pt has FH of DM in paternal uncle.  Hypothyroidism  -Diagnosed years ago -On LT4, now on desiccated thyroid extract (NP thyroid) -She was previously on a very large dose of NP thyroid, 150 mg daily at last visit, with suppressed TSH (also with fatigue, subjective hyperthermia, insomnia).  At last visit she was on 105 mg of NP thyroid daily but she was having hot flashes so we decreased the  dose to 90 mg daily. She takes this: - in am - fasting - at least 30 min from b'fast - + Ca and PPIs later in the day - no Fe, MVI - not on Biotin  TFTs were reviewed Lab Results  Component Value Date   TSH 3.08 11/27/2019   TSH 0.50 10/18/2019   TSH 1.52 07/19/2019   TSH 2.16 05/22/2019   TSH 0.08 (L) 04/17/2019   TSH 0.05 (L) 02/20/2019   She also has an isthmic thyroid nodule 1.8 cm >> she had a biopsy of this nodule in 2019: Benign.  Pt denies: - feeling nodules in neck - hoarseness - dysphagia - choking - SOB with lying down  Parents have thyroid ds. No FH of thyroid cancer. No h/o radiation tx to head or neck.  No herbal  supplements. No Biotin use. No recent steroids use.   She continues on testosterone compounded cream.  ROS: Constitutional: no weight gain/no weight loss, no fatigue, no subjective hyperthermia, no subjective hypothermia Eyes: no blurry vision, no xerophthalmia ENT: no sore throat, + see HPI Cardiovascular: no CP/no SOB/no palpitations/+ mild leg swelling Respiratory: no cough/no SOB/no wheezing Gastrointestinal: no N/no V/no D/no C/no acid reflux Musculoskeletal: no muscle aches/no joint aches Skin: no rashes, no hair loss Neurological: no tremors/no numbness/no tingling/no dizziness  I reviewed pt's medications, allergies, PMH, social hx, family hx, and changes were documented in the history of present illness. Otherwise, unchanged from my initial visit note.  Past Medical History:  Diagnosis Date  . Anemia   . Arthritis   . Depression   . GERD (gastroesophageal reflux disease)   . HLD (hyperlipidemia) 02/20/2019  . Morbid obesity ([place]) 02/20/2019  . Primary hypothyroidism 02/20/2019  . RLS (restless legs syndrome)   . Shingles   . Sleep apnea   . Type II diabetes mellitus, uncontrolled ([place]) 02/20/2019  . Vitamin D deficiency disease 02/20/2019   Past Surgical History:  Procedure Laterality Date  . BACK SURGERY  2012, 2016   lumbar fusion 2012, cleaning out of area 2016  . BREAST BIOPSY Left 2010  . CHOLECYSTECTOMY  2011  . CLOSED REDUCTION ANKLE FRACTURE    . CLOSED REDUCTION HUMERUS FRACTURE Right 2014  . COLONOSCOPY WITH PROPOFOL N/A 02/21/2018   Procedure: COLONOSCOPY WITH PROPOFOL;  Surgeon: Danie Binder, MD;  [facility];  Service: Endoscopy;  Laterality: N/A;  1:15pm - pt knows to arrive at 10:30  . EYE SURGERY  2016   eye lid droop   . KNEE ARTHROSCOPY Right 2010  . POLYPECTOMY  02/21/2018   Procedure: POLYPECTOMY;  Surgeon: Danie Binder, MD;  [facility];  Service: Endoscopy;;  hepatic flexure polyp  . TOTAL KNEE ARTHROPLASTY Right  01/31/2017   Procedure: RIGHT TOTAL KNEE ARTHROPLASTY;  Surgeon: Paralee Cancel, MD;  [facility];  Service: Orthopedics;  Laterality: Right;  90 mins   Social History   Socioeconomic History  . Marital status: Married    Spouse name: Not on file  . Number of children:  3  . Years of education: Not on file  . Highest education level: Not on file  Occupational History  .  + Homemaker, previously librarian  Social Needs  . Financial resource strain: Not on file  . Food insecurity    Worry: Not on file    Inability: Not on file  . Transportation needs    Medical: Not on file    Non-medical: Not on file  Tobacco Use  . Smoking status: Former Smoker    Packs/day: 1.00    Years: 8.00    Pack years: 8.00    Types: Cigarettes    Start date: 06/14/1970    Quit date: 06/14/1978    Years since quitting: 40.8  . Smokeless tobacco: Never Used  . Tobacco comment: quit age 14  Substance and Sexual Activity  . Alcohol use: Yes    Alcohol/week:  1-2 drinks daily    Varies       . Drug use: No   Current Outpatient Medications on File Prior to Visit  Medication Sig Dispense Refill  . Calcium Carb-Cholecalciferol (CALCIUM 600/VITAMIN D3 PO) Take 1 tablet by mouth daily.    . Cholecalciferol (VITAMIN D-3) 125 MCG (5000 UT) TABS Take 10,000 Units by mouth daily at 12 noon.    . Dulaglutide (TRULICITY) 3 AO/1.3YQ SOPN Inject 3 mg into the skin once a week. 12 pen 3  . furosemide (LASIX) 40 MG tablet Take 1 tablet (40 mg total) by mouth daily. 90 tablet 2  . glipiZIDE (GLUCOTROL) 5 MG tablet Take 1 tablet (5 mg total) by mouth 2 (two) times daily. 180 tablet 1  . HYDROcodone-acetaminophen (NORCO/VICODIN) 5-325 MG tablet Take 1 tablet by mouth 4 (four) times daily as needed for pain.    Marland Kitchen ibuprofen (ADVIL) 200 MG tablet Take 400 mg by mouth every 6 (six) hours as needed for mild pain.    Marland Kitchen insulin detemir (LEVEMIR FLEXTOUCH) 100 UNIT/ML FlexPen Inject 50 Units into the skin at bedtime. 15 pen 3   . Magnesium 500 MG TABS Take 500 mg by mouth every evening.    . Melatonin 1 MG CAPS Take 1 mg by mouth at bedtime.     . metFORMIN (GLUCOPHAGE) 1000 MG tablet Take 1 tablet (1,000 mg total) by mouth 2 (two) times daily. 180 tablet 0  . NON FORMULARY Apply 1 application topically every 3 (three) days. Testosterone Compound Cream    . omeprazole (PRILOSEC) 20 MG capsule Take 20 mg by mouth every other day.    Vladimir Faster Glycol-Propyl Glycol (SYSTANE FREE OP) Place 1 drop into both eyes every 4 (four) hours as needed (Dry eyes).    Marland Kitchen rOPINIRole (REQUIP) 3 MG tablet Take 1 tablet (3 mg total) by mouth at bedtime. 90 tablet 1  . rosuvastatin (CRESTOR) 20 MG tablet Take 1 tablet (20 mg total) by mouth daily. 90 tablet 1  . Testosterone Propionate (FIRST-TESTOSTERONE MC) 2 % CREA Place 5 mg onto the skin daily. 30 g 0  . thyroid (NP THYROID) 30 MG tablet Take 3 tablets (90 mg total) by mouth daily before breakfast. 270 tablet 1  . vitamin B-12 (CYANOCOBALAMIN) 1000 MCG tablet Take 1,000 mcg by mouth daily.    . [DISCONTINUED] furosemide (LASIX) 40 MG tablet Take 1 tablet (40 mg total) by mouth daily. (Patient taking differently: Take 40 mg by mouth daily as needed. ) 30 tablet 3  . [DISCONTINUED] furosemide (LASIX) 40 MG tablet TAKE 1 TABLET BY MOUTH EVERY DAY (Patient taking differently: Take 40 mg by mouth daily as needed for fluid or edema. ) 90 tablet 1  . [DISCONTINUED] glipiZIDE (GLUCOTROL) 5 MG tablet Take 1 tablet (5 mg total) by mouth daily before supper. 180 tablet 3  . [DISCONTINUED] Insulin Detemir (LEVEMIR) 100 UNIT/ML Pen Inject 40 Units into the skin daily. (Patient not taking: Reported on 04/17/2019) 36 mL 1  . [DISCONTINUED] metFORMIN (GLUCOPHAGE) 1000 MG tablet Take  1 tablet (1,000 mg total) by mouth 2 (two) times daily. 180 tablet 0  . [DISCONTINUED] metFORMIN (GLUCOPHAGE) 1000 MG tablet TAKE 1 TABLET BY MOUTH TWICE A DAY 180 tablet 0  . [DISCONTINUED] rOPINIRole (REQUIP) 3 MG tablet  TAKE ONE TABLET BY MOUTH AT NIGHT 30 tablet 3  . [DISCONTINUED] rosuvastatin (CRESTOR) 20 MG tablet Take 1 tablet (20 mg total) by mouth at bedtime. 90 tablet 0   No current facility-administered medications on file prior to visit.   Allergies  Allergen Reactions  . Sulfa Antibiotics Hives  . Ace Inhibitors Cough  . Invokana [Canagliflozin] Other (See Comments)    Yeast   . Vytorin [Ezetimibe-Simvastatin] Other (See Comments)    Leg cramps  . Atorvastatin Hives   Family History  Problem Relation Age of Onset  . Cancer Mother   . Heart disease Father   . Stroke Father   . Cancer Father   . Heart disease Brother   . Hyperlipidemia Brother   . Hyperlipidemia Brother   . Colon cancer Neg Hx     PE: BP 130/60   Pulse 88   Ht 5\' 4"  (1.626 m)   Wt 204 lb (92.5 kg)   SpO2 95%   BMI 35.02 kg/m  Wt Readings from Last 3 Encounters:  02/14/20 204 lb (92.5 kg)  01/08/20 201 lb 3.2 oz (91.3 kg)  11/27/19 206 lb 3.2 oz (93.5 kg)   Constitutional: overweight, in NAD Eyes: PERRLA, EOMI, no exophthalmos ENT: moist mucous membranes, no thyromegaly, no cervical lymphadenopathy Cardiovascular: RRR, No MRG, + mild periankle swelling B Respiratory: CTA B Gastrointestinal: abdomen soft, NT, ND, BS+ Musculoskeletal: no deformities, strength intact in all 4 Skin: moist, warm, no rashes Neurological: no tremor with outstretched hands, DTR normal in all 4  ASSESSMENT: 1. DM2, insulin-dependent, uncontrolled, without long-term complications, but with hyperglycemia  2.  Hypothyroidism -Previously uncontrolled  PLAN:  1. Patient with longstanding, uncontrolled, type 2 diabetes, on oral antidiabetic regimen with Metformin, sulfonylurea and also daily long-acting insulin and weekly GLP-1 receptor agonist. At last visit, sugars are higher compared to before and more fluctuating. She attributed this to relaxing her diet but she was planning to stop snacking and try to improve her diet. We  continued with dietary changes, which worked well for her in the past but we did not change the regimen. At last visit, HbA1c was 7.7%, increased from 7.0%. - at this visit,  sugars are still variable as she had a busy summer with her son's wedding and having had company, but they do appear slightly better.  Her sugars, will increase to the 200s and even had 1 blood sugar in the 300s after dietary indiscretions.  She is planning to reduce these, but we discussed that if the sugars remain high after certain meals, at next visit, we may need mealtime insulin at least before larger meals or meals that contain more sweets.  However, for now, since she is planning to improve her diet and her activity slowed down, we can continue on the current regimen. -We discussed about potentially starting [place] diet -At next visit, she may need a fructosamine level, since HbA1c levels may not be very accurate for her. - I suggested to:  Patient Instructions  Please continue: - Metformin 2000 mg at night - Glipizide 5 mg 2x a day before 1st and last meal of the day  - Trulicity 3 mg weekly - Levemir 50 units at bedtime  Please continue NP thyroid 90  mg in a.m.  Take the thyroid hormone every day, with water, at least 30 minutes before breakfast, separated by at least 4 hours from: - acid reflux medications - calcium - iron - multivitamins  Please stop at the lab.  Please return in 3-4 months with your sugar log  - we checked her HbA1c: 7.2% (better) - advised to check sugars at different times of the day - 1-2x a day, rotating check times - advised for yearly eye exams >> she is UTD - return to clinic in 3-4 months  2.  Hypothyroidism - latest thyroid labs reviewed with pt >> normal: Lab Results  Component Value Date   TSH 3.08 11/27/2019   - she continues on NP 90 mg daily -the dose was decreased from 105 mg daily at last visit due to hot flashes.  She has to make this up from other tablet since the  90 mg tablet is not available on the market right now.  She would be open to switch back to [place], if needed in the future. - pt feels good on this dose. Her hot flashes have improved, but she feels that this may be from testosterone cream started by Dr. Anastasio Champion. - we discussed about taking the thyroid hormone every day, with water, >30 minutes before breakfast, separated by >4 hours from acid reflux medications, calcium, iron, multivitamins. Pt. is taking it correctly. - will check thyroid tests today: TSH, free T3 and fT4 - If labs are abnormal, she will need to return for repeat TFTs in 1.5 months  Component     Latest Ref Rng & Units 02/14/2020  Cholesterol     0 - 200 mg/dL 159  Triglycerides     0 - 149 mg/dL 204.0 (H)  HDL Cholesterol     >39.00 mg/dL 38.60 (L)  VLDL     0.0 - 40.0 mg/dL 40.8 (H)  Total CHOL/HDL Ratio      4  NonHDL      120.07  Triiodothyronine,Free,Serum     2.3 - 4.2 pg/mL 4.1  TSH     0.35 - 4.50 uIU/mL 4.14  T4,Free(Direct)     0.60 - 1.60 ng/dL 0.78  Direct LDL     mg/dL 96.0   Thyroid tests normal.  We will continue the current dose of NP thyroid.   LDL at goal, while triglycerides are above target and HDL lower than target.  We discussed about considering starting a diet.  Philemon Kingdom, MD PhD [facility] Endocr[facility]

## 2020-02-14 NOTE — Patient Instructions (Signed)
Please continue: - Metformin 2000 mg at night - Glipizide 5 mg 2x a day before 1st and last meal of the day  - Trulicity 3 mg weekly - Levemir 50 units at bedtime  Please continue NP thyroid 90 mg in a.m.  Take the thyroid hormone every day, with water, at least 30 minutes before breakfast, separated by at least 4 hours from: - acid reflux medications - calcium - iron - multivitamins  Please stop at the lab.  Please return in 3 months with your sugar log

## 2020-02-15 ENCOUNTER — Encounter: Payer: Self-pay | Admitting: Internal Medicine

## 2020-02-21 ENCOUNTER — Other Ambulatory Visit (INDEPENDENT_AMBULATORY_CARE_PROVIDER_SITE_OTHER): Payer: Self-pay | Admitting: Internal Medicine

## 2020-02-27 ENCOUNTER — Encounter (INDEPENDENT_AMBULATORY_CARE_PROVIDER_SITE_OTHER): Payer: Self-pay | Admitting: Internal Medicine

## 2020-02-27 ENCOUNTER — Other Ambulatory Visit (INDEPENDENT_AMBULATORY_CARE_PROVIDER_SITE_OTHER): Payer: Self-pay | Admitting: Nurse Practitioner

## 2020-02-27 DIAGNOSIS — E119 Type 2 diabetes mellitus without complications: Secondary | ICD-10-CM

## 2020-02-27 MED ORDER — INSULIN PEN NEEDLE 32G X 4 MM MISC: 1.0000 | Freq: Every day | 6 refills | Status: DC

## 2020-02-27 NOTE — Telephone Encounter (Signed)
refill 

## 2020-02-28 ENCOUNTER — Other Ambulatory Visit (INDEPENDENT_AMBULATORY_CARE_PROVIDER_SITE_OTHER): Payer: Self-pay | Admitting: Nurse Practitioner

## 2020-02-28 DIAGNOSIS — E119 Type 2 diabetes mellitus without complications: Secondary | ICD-10-CM

## 2020-02-28 MED ORDER — INSULIN PEN NEEDLE 32G X 4 MM MISC: 1.0000 | Freq: Every day | 6 refills | Status: DC

## 2020-03-10 ENCOUNTER — Other Ambulatory Visit (INDEPENDENT_AMBULATORY_CARE_PROVIDER_SITE_OTHER): Payer: Self-pay | Admitting: Nurse Practitioner

## 2020-03-10 ENCOUNTER — Encounter (INDEPENDENT_AMBULATORY_CARE_PROVIDER_SITE_OTHER): Payer: Self-pay | Admitting: Internal Medicine

## 2020-03-10 MED ORDER — METFORMIN HCL 1000 MG PO TABS: 1000.0000 mg | ORAL_TABLET | Freq: Two times a day (BID) | ORAL | 1 refills | Status: DC

## 2020-03-10 NOTE — Telephone Encounter (Signed)
Refill to [facility]

## 2020-03-17 ENCOUNTER — Other Ambulatory Visit (INDEPENDENT_AMBULATORY_CARE_PROVIDER_SITE_OTHER): Payer: Self-pay | Admitting: Internal Medicine

## 2020-04-02 ENCOUNTER — Other Ambulatory Visit (INDEPENDENT_AMBULATORY_CARE_PROVIDER_SITE_OTHER): Payer: Self-pay | Admitting: Internal Medicine

## 2020-04-02 ENCOUNTER — Encounter (INDEPENDENT_AMBULATORY_CARE_PROVIDER_SITE_OTHER): Payer: Self-pay | Admitting: Internal Medicine

## 2020-04-02 MED ORDER — PREDNISONE 20 MG PO TABS: 40.0000 mg | ORAL_TABLET | Freq: Every day | ORAL | 1 refills | Status: DC

## 2020-04-09 ENCOUNTER — Encounter (INDEPENDENT_AMBULATORY_CARE_PROVIDER_SITE_OTHER): Payer: Self-pay | Admitting: Internal Medicine

## 2020-04-09 ENCOUNTER — Other Ambulatory Visit: Payer: Self-pay

## 2020-04-09 ENCOUNTER — Ambulatory Visit (INDEPENDENT_AMBULATORY_CARE_PROVIDER_SITE_OTHER): Payer: Managed Care, Other (non HMO) | Admitting: Internal Medicine

## 2020-04-09 ENCOUNTER — Ambulatory Visit (HOSPITAL_COMMUNITY)
Admission: RE | Admit: 2020-04-09 | Discharge: 2020-04-09 | Disposition: A | Discharge status: Home / Self Care | Payer: Managed Care, Other (non HMO) | Source: Ambulatory Visit | Attending: Internal Medicine | Admitting: Internal Medicine

## 2020-04-09 VITALS — BP 118/74 | HR 102 | Temp 96.6°F | Ht 64.0 in | Wt 206.2 lb

## 2020-04-09 DIAGNOSIS — R0782 Intercostal pain: Secondary | ICD-10-CM | POA: Diagnosis present

## 2020-04-09 DIAGNOSIS — M545 Low back pain, unspecified: Secondary | ICD-10-CM | POA: Diagnosis not present

## 2020-04-09 MED ORDER — PREDNISONE 20 MG PO TABS: 40.0000 mg | ORAL_TABLET | Freq: Every day | ORAL | 1 refills | Status: DC
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
## 2020-04-10 ENCOUNTER — Telehealth (INDEPENDENT_AMBULATORY_CARE_PROVIDER_SITE_OTHER): Payer: Self-pay
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
## 2020-04-11 ENCOUNTER — Other Ambulatory Visit (INDEPENDENT_AMBULATORY_CARE_PROVIDER_SITE_OTHER): Payer: Self-pay | Admitting: Nurse Practitioner

## 2020-04-11 ENCOUNTER — Encounter (INDEPENDENT_AMBULATORY_CARE_PROVIDER_SITE_OTHER): Payer: Self-pay | Admitting: Nurse Practitioner

## 2020-04-11 DIAGNOSIS — S22080A Wedge compression fracture of T11-T12 vertebra, initial encounter for closed fracture: Secondary | ICD-10-CM
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
## 2020-04-14 ENCOUNTER — Encounter (INDEPENDENT_AMBULATORY_CARE_PROVIDER_SITE_OTHER): Payer: Self-pay | Admitting: Nurse Practitioner

## 2020-04-14 ENCOUNTER — Encounter (INDEPENDENT_AMBULATORY_CARE_PROVIDER_SITE_OTHER): Payer: Self-pay | Admitting: Internal Medicine

## 2020-04-14 NOTE — Progress Notes (Signed)
Yes, I sent it to where her insurance will cover as she requested. [place].

## 2020-04-15 ENCOUNTER — Encounter (INDEPENDENT_AMBULATORY_CARE_PROVIDER_SITE_OTHER): Payer: Self-pay | Admitting: Internal Medicine

## 2020-04-17 ENCOUNTER — Telehealth (INDEPENDENT_AMBULATORY_CARE_PROVIDER_SITE_OTHER): Payer: Self-pay

## 2020-04-17 DIAGNOSIS — S22080A Wedge compression fracture of T11-T12 vertebra, initial encounter for closed fracture: Secondary | ICD-10-CM
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
## 2020-04-19 ENCOUNTER — Other Ambulatory Visit (INDEPENDENT_AMBULATORY_CARE_PROVIDER_SITE_OTHER): Payer: Self-pay | Admitting: Nurse Practitioner

## 2020-04-21 ENCOUNTER — Encounter (INDEPENDENT_AMBULATORY_CARE_PROVIDER_SITE_OTHER): Payer: Self-pay

## 2020-04-21 ENCOUNTER — Telehealth (INDEPENDENT_AMBULATORY_CARE_PROVIDER_SITE_OTHER): Payer: Self-pay

## 2020-04-21 NOTE — Telephone Encounter (Signed)
[id number] Megan Church 01-02-1956 Approved  04/21/2020 [id number] 04/21/2020 07/20/2020 Nicki [place] [facility] 01-23-20 16:33

## 2020-04-21 NOTE — Telephone Encounter (Signed)
Great!

## 2020-04-26 ENCOUNTER — Ambulatory Visit
Admission: RE | Admit: 2020-04-26 | Discharge: 2020-04-26 | Disposition: A | Discharge status: Home / Self Care | Payer: Managed Care, Other (non HMO) | Source: Ambulatory Visit | Attending: Internal Medicine | Admitting: Internal Medicine

## 2020-04-28 ENCOUNTER — Encounter (INDEPENDENT_AMBULATORY_CARE_PROVIDER_SITE_OTHER): Payer: Self-pay | Admitting: Internal Medicine

## 2020-04-28 ENCOUNTER — Other Ambulatory Visit (INDEPENDENT_AMBULATORY_CARE_PROVIDER_SITE_OTHER): Payer: Self-pay | Admitting: Internal Medicine

## 2020-04-28 MED ORDER — OMEPRAZOLE 20 MG PO CPDR
20.0000 mg | DELAYED_RELEASE_CAPSULE | ORAL | 1 refills | Status: DC
Start: 2020-04-28 — End: 2020-06-17

## 2020-04-28 MED ORDER — HYDROCODONE-ACETAMINOPHEN 5-325 MG PO TABS: 1.0000 | ORAL_TABLET | Freq: Four times a day (QID) | ORAL | 0 refills | Status: DC | PRN

## 2020-04-30 ENCOUNTER — Encounter (INDEPENDENT_AMBULATORY_CARE_PROVIDER_SITE_OTHER): Payer: Self-pay | Admitting: Internal Medicine

## 2020-05-18 ENCOUNTER — Encounter (INDEPENDENT_AMBULATORY_CARE_PROVIDER_SITE_OTHER): Payer: Self-pay | Admitting: Internal Medicine

## 2020-05-19 ENCOUNTER — Other Ambulatory Visit: Payer: Self-pay

## 2020-05-19 ENCOUNTER — Encounter (INDEPENDENT_AMBULATORY_CARE_PROVIDER_SITE_OTHER): Payer: Self-pay | Admitting: Nurse Practitioner

## 2020-05-19 ENCOUNTER — Ambulatory Visit (INDEPENDENT_AMBULATORY_CARE_PROVIDER_SITE_OTHER): Payer: Medicare Other | Admitting: Nurse Practitioner

## 2020-05-19 VITALS — BP 120/82 | HR 114 | Temp 97.6°F | Ht 64.0 in | Wt 203.8 lb

## 2020-05-19 DIAGNOSIS — H109 Unspecified conjunctivitis: Secondary | ICD-10-CM

## 2020-05-19 MED ORDER — POLYMYXIN B-TRIMETHOPRIM 10000-0.1 UNIT/ML-% OP SOLN: 1.0000 [drp] | OPHTHALMIC | 0 refills | Status: AC

## 2020-05-19 NOTE — Progress Notes (Signed)
Subjective:  Patient ID: Megan Church, female    DOB: [birth year]  Age: 64 y.o. MRN: [id number]  CC:  Chief Complaint  Patient presents with  . Eye Problem    Right eye, yesterday was crusty and itches, started yesterday      HPI  This patient arrives today for the above.  She is have been having the above symptoms since yesterday.  Is located to her right eye.  She has been using some moxifloxacin which she had leftover from previous cataract surgery.  She tells me she feels that her symptoms are improving since using it.  Looking at the eyedrops box her eyedrops expired in June of this year.  She is not aware of any sick contacts that she has been around.  She tells me she started to notice some of the same symptoms in her left eye.  Past Medical History:  Diagnosis Date  . Anemia   . Arthritis   . Depression   . GERD (gastroesophageal reflux disease)   . HLD (hyperlipidemia) 02/20/2019  . Morbid obesity ([place]) 02/20/2019  . Primary hypothyroidism 02/20/2019  . RLS (restless legs syndrome)   . Shingles   . Sleep apnea   . Type II diabetes mellitus, uncontrolled ([place]) 02/20/2019  . Vitamin D deficiency disease 02/20/2019      Family History  Problem Relation Age of Onset  . Cancer Mother   . Heart disease Father   . Stroke Father   . Cancer Father   . Heart disease Brother   . Hyperlipidemia Brother   . Hyperlipidemia Brother   . 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   Social History   Tobacco Use  . Smoking status: Former Smoker    Packs/day: 1.00    Years: 8.00    Pack years: 8.00    Types: Cigarettes    Start date: 06/14/1970    Quit date: 06/14/1978    Years since quitting: 41.9  . Smokeless tobacco: Never Used  . Tobacco comment: quit age 55  Substance Use  Topics  . Alcohol use: Yes    Alcohol/week: 14.0 standard drinks    Types: 7 Glasses of wine, 7 Shots of liquor per week     Current Meds  Medication Sig  . Calcium Carb-Cholecalciferol (CALCIUM 600/VITAMIN D3 PO) Take 1 tablet by mouth daily.  . Cholecalciferol (VITAMIN D-3) 125 MCG (5000 UT) TABS Take 10,000 Units by mouth daily at 12 noon.  . cyclobenzaprine (FLEXERIL) 10 MG tablet Take 10 mg by mouth as needed.   . Dulaglutide (TRULICITY) 3 QM/5.7QI SOPN Inject 3 mg into the skin once a week.  . furosemide (LASIX) 40 MG tablet Take 1 tablet (40 mg total) by mouth daily.  Marland Kitchen glipiZIDE (GLUCOTROL) 5 MG tablet Take 1 tablet (5 mg total) by mouth 2 (two) times daily.  Marland Kitchen HYDROcodone-acetaminophen (NORCO/VICODIN) 5-325 MG tablet Take 1 tablet by mouth 4 (four) times daily as needed.  Marland Kitchen ibuprofen (ADVIL) 200 MG tablet Take 400 mg by mouth every 6 (six) hours as needed for mild pain.  Marland Kitchen insulin detemir (LEVEMIR FLEXTOUCH) 100 UNIT/ML FlexPen Inject 50 Units into the skin at bedtime.  . Insulin Pen Needle 32G X 4 MM MISC 1 each by Does not apply route at bedtime.  Marland Kitchen  Magnesium 500 MG TABS Take 500 mg by mouth every evening.  . Melatonin 1 MG CAPS Take 1 mg by mouth at bedtime.   . metFORMIN (GLUCOPHAGE) 1000 MG tablet Take 1 tablet (1,000 mg total) by mouth 2 (two) times daily.  . NP THYROID 90 MG tablet Take 90 mg by mouth daily.  Marland Kitchen omeprazole (PRILOSEC) 20 MG capsule Take 1 capsule (20 mg total) by mouth every other day.  Vladimir Faster Glycol-Propyl Glycol (SYSTANE FREE OP) Place 1 drop into both eyes every 4 (four) hours as needed (Dry eyes).  Marland Kitchen rOPINIRole (REQUIP) 3 MG tablet TAKE 1 TABLET (3 MG TOTAL) BY MOUTH AT BEDTIME.  . rosuvastatin (CRESTOR) 20 MG tablet Take 1 tablet (20 mg total) by mouth daily.  . Testosterone Propionate (FIRST-TESTOSTERONE MC) 2 % CREA Place 5 mg onto the skin daily.  . vitamin B-12 (CYANOCOBALAMIN) 1000 MCG tablet Take 1,000 mcg by mouth daily.    ROS:  See  HPI   Objective:   Today's Vitals: BP 120/82   Pulse (!) 114   Temp 97.6 F (36.4 C) (Temporal)   Ht 5\' 4"  (1.626 m)   Wt 203 lb 12.8 oz (92.4 kg)   SpO2 96%   BMI 34.98 kg/m  Vitals with BMI 05/19/2020 04/09/2020 02/14/2020  Height 5\' 4"  5\' 4"  5\' 4"   Weight 203 lbs 13 oz 206 lbs 3 oz 204 lbs  BMI 09.62 83.66 35  Systolic 294 765 465  Diastolic 82 74 60  Pulse 035 102 88     Physical Exam Constitutional:      Appearance: Normal appearance.  Eyes:     General: Lids are normal.        Right eye: Discharge present. No foreign body.        Left eye: No foreign body or discharge.     Conjunctiva/sclera:     Right eye: Right conjunctiva is injected. Exudate present. No hemorrhage.    Left eye: Left conjunctiva is not injected. No exudate or hemorrhage. Skin:    General: Skin is warm and dry.  Neurological:     Mental Status: She is alert and oriented to person, place, and time.          Assessment and Plan   1. Bacterial conjunctivitis      Plan: 1.  Probable bacterial conjunctivitis.  Will treat with polymyxin B plus trimethoprim eyedrops for 7 to 10 days.  She is encouraged to let me know if symptoms persist or worsen.  She will follow-up as scheduled next week for her annual physical exam.   Tests ordered No orders of the defined types were placed in this encounter.     Meds ordered this encounter  Medications  . trimethoprim-polymyxin b (POLYTRIM) ophthalmic solution    Sig: Place 1-2 drops into the right eye every 4 (four) hours for 10 days.    Dispense:  10 mL    Refill:  0    Order Specific Question:   Supervising Provider    Answer:   Hurshel Party C [[id number]]     Ailene Ards, NP

## 2020-05-19 NOTE — Patient Instructions (Signed)
Gramicidin; Neomycin; Polymyxin B ophthalmic solution What is this medicine? Gramicidin; Neomycin; Polymyxin B (gram i SYE din; nee oh MYE sin; pol i MIX in B) is an antibiotic. It is used in the eye to treat certain types of infections. This medicine may be used for other purposes; ask your health care provider or pharmacist if you have questions. COMMON BRAND NAME(S): AK-Spore, AK-Spore Ophthalmic, Neocidin, Neosporin, Ocu-Spore-G, Polymycin What should I tell my health care provider before I take this medicine? They need to know if you have any of these conditions:  an unusual or allergic reaction to gramicidin; neomycin; polymixin b, other medicines, foods, dyes, or preservatives  pregnant or trying to get pregnant  breast-feeding How should I use this medicine? This medicine is only for use in the eye. Do not take by mouth. Follow the directions on the prescription label. Wash hands before and after use. Tilt your head back slightly and pull your lower eyelid down with your index finger to form a pouch. Try not to touch the tip of the dropper to your eye, fingertips, or any other surface. Squeeze the prescribed number of drops into the pouch. Close the eye gently to spread the drops. Your vision may blur for a few minutes. Use your doses at regular intervals. Do not use your medicine more often than directed. Finish the full course prescribed by your doctor or health care professional even if you think your condition is better. Talk to your pediatrician regarding the use of this medicine in children. Special care may be needed. Overdosage: If you think you have taken too much of this medicine contact a poison control center or emergency room at once. NOTE: This medicine is only for you. Do not share this medicine with others. What if I miss a dose? If you miss a dose, use it as soon as you can. If it is almost time for your next dose, use only that dose. Do not use double or extra  doses. What may interact with this medicine? Interactions are not expected. Do not use any other eye products without telling your doctor or health care professional. This list may not describe all possible interactions. Give your health care provider a list of all the medicines, herbs, non-prescription drugs, or dietary supplements you use. Also tell them if you smoke, drink alcohol, or use illegal drugs. Some items may interact with your medicine. What should I watch for while using this medicine? Tell your doctor or health care professional if your symptoms do not start to get better or if they get worse. If your eyes are more sensitive to light, wear sunglasses. Do not wear contact lenses while you have any signs or symptoms of an eye infection. Ask your doctor or health care professional when you can start wearing your lenses again. Stop using this medicine immediately if you notice signs of an allergic reaction. What side effects may I notice from receiving this medicine? Side effects that you should report to your doctor or health care professional as soon as possible:  blurred vision that does not go away  burning, stinging, or itching of the eyes or eyelids  redness, swelling, or pain of the eyes or eyelids Side effects that usually do not require medical attention (report to your doctor or health care professional if they continue or are bothersome):  temporary blurred vision  tearing or feeling of something in the eye This list may not describe all possible side effects. Call your doctor  for medical advice about side effects. You may report side effects to FDA at 1-800-FDA-1088. Where should I keep my medicine? Keep out of the reach of children. Store at room temperature between 15 and 25 degrees C (59 and 77 degrees F). Protect from light. Throw away any unused medicine after the expiration date. NOTE: This sheet is a summary. It may not cover all possible information. If you  have questions about this medicine, talk to your doctor, pharmacist, or health care provider.  2020 Elsevier/Gold Standard (2007-10-11 12:15:49)

## 2020-05-20 ENCOUNTER — Ambulatory Visit: Payer: Managed Care, Other (non HMO) | Admitting: Internal Medicine

## 2020-05-26 ENCOUNTER — Encounter (INDEPENDENT_AMBULATORY_CARE_PROVIDER_SITE_OTHER): Payer: Self-pay | Admitting: Internal Medicine

## 2020-05-26 ENCOUNTER — Other Ambulatory Visit: Payer: Self-pay

## 2020-05-26 ENCOUNTER — Ambulatory Visit (INDEPENDENT_AMBULATORY_CARE_PROVIDER_SITE_OTHER): Payer: Medicare Other | Admitting: Internal Medicine

## 2020-05-26 VITALS — BP 114/72 | HR 94 | Temp 96.6°F | Ht 62.75 in | Wt 205.2 lb

## 2020-05-26 DIAGNOSIS — E782 Mixed hyperlipidemia: Secondary | ICD-10-CM | POA: Diagnosis not present

## 2020-05-26 DIAGNOSIS — S22080A Wedge compression fracture of T11-T12 vertebra, initial encounter for closed fracture: Secondary | ICD-10-CM | POA: Diagnosis not present

## 2020-05-26 DIAGNOSIS — E039 Hypothyroidism, unspecified: Secondary | ICD-10-CM | POA: Diagnosis not present

## 2020-05-26 DIAGNOSIS — E1165 Type 2 diabetes mellitus with hyperglycemia: Secondary | ICD-10-CM

## 2020-05-26 MED ORDER — HYDROCODONE-ACETAMINOPHEN 5-325 MG PO TABS: 1.0000 | ORAL_TABLET | Freq: Four times a day (QID) | ORAL | 0 refills | Status: DC | PRN

## 2020-05-26 NOTE — Progress Notes (Signed)
Metrics: Intervention Frequency ACO  Documented Smoking Status Yearly  Screened one or more times in 24 months  Cessation Counseling or  Active cessation medication Past 24 months  Past 24 months   Guideline developer: UpToDate (See UpToDate for funding source) Date Released: 2014       Wellness Office Visit  Subjective:  Patient ID: Megan Church, female    DOB: 1956/04/10  Age: 64 y.o. MRN: [id number]  CC: This lady was scheduled for an annual physical but it appears that her current insurance is not going to reimburse for an annual physical so we decided to convert the visit into a follow-up visit.  HPI  She has a history of uncontrolled diabetes, hypothyroidism, morbid obese, hyperlipidemia. She was recently seen by Dr. Rolena Infante, orthopedic surgeon, because of an abnormal MRI lumbar spine which showed T12 compression fracture but and another abnormality lower down when there was a significant cyst.  Apparently attempt was made to drain the cyst but this was not successful.  She is going to see Dr. Rolena Infante again soon. Other than this, she sees endocrinology in [facility] for her diabetes and hypothyroidism. She seems to be doing well reasonably overall. Past Medical History:  Diagnosis Date  . Anemia   . Arthritis   . Depression   . GERD (gastroesophageal reflux disease)   . HLD (hyperlipidemia) 02/20/2019  . Morbid obesity ([place]) 02/20/2019  . Primary hypothyroidism 02/20/2019  . RLS (restless legs syndrome)   . Shingles   . Sleep apnea   . Type II diabetes mellitus, uncontrolled ([place]) 02/20/2019  . Vitamin D deficiency disease 02/20/2019   Past Surgical History:  Procedure Laterality Date  . BACK SURGERY  2012, 2016   lumbar fusion 2012, cleaning out of area 2016  . BREAST BIOPSY Left 2010  . CHOLECYSTECTOMY  2011  . CLOSED REDUCTION ANKLE FRACTURE    . CLOSED REDUCTION HUMERUS FRACTURE Right 2014  . COLONOSCOPY WITH PROPOFOL N/A 02/21/2018   Procedure: COLONOSCOPY WITH  PROPOFOL;  Surgeon: Danie Binder, MD;  [facility];  Service: Endoscopy;  Laterality: N/A;  1:15pm - pt knows to arrive at 10:30  . EYE SURGERY  2016   eye lid droop   . KNEE ARTHROSCOPY Right 2010  . POLYPECTOMY  02/21/2018   Procedure: POLYPECTOMY;  Surgeon: Danie Binder, MD;  [facility];  Service: Endoscopy;;  hepatic flexure polyp  . TOTAL KNEE ARTHROPLASTY Right 01/31/2017   Procedure: RIGHT TOTAL KNEE ARTHROPLASTY;  Surgeon: Paralee Cancel, MD;  [facility]: [facility];  Service: Orthopedics;  Laterality: Right;  90 mins     Family History  Problem Relation Age of Onset  . Cancer Mother   . Heart disease Father   . Stroke Father   . Cancer Father   . Heart disease Brother   . Hyperlipidemia Brother   . Hyperlipidemia Brother   . Colon cancer Neg Hx     Social History   Social History Narrative   Married for 42 years.Moved from [place] in December as husband found new job.Used to be a librarian,retired.Husband a Health visitor from [place] retired.   Social History   Tobacco Use  . Smoking status: Former Smoker    Packs/day: 1.00    Years: 8.00    Pack years: 8.00    Types: Cigarettes    Start date: 06/14/1970    Quit date: 06/14/1978    Years since quitting: 41.9  . Smokeless tobacco: Never Used  .  Tobacco comment: quit age 30  Substance Use Topics  . Alcohol use: Yes    Alcohol/week: 14.0 standard drinks    Types: 7 Glasses of wine, 7 Shots of liquor per week    Current Meds  Medication Sig  . Calcium Carb-Cholecalciferol (CALCIUM 600/VITAMIN D3 PO) Take 1 tablet by mouth daily.  . Cholecalciferol (VITAMIN D-3) 125 MCG (5000 UT) TABS Take 10,000 Units by mouth daily at 12 noon.  . cyclobenzaprine (FLEXERIL) 10 MG tablet Take 10 mg by mouth as needed.   . Dulaglutide (TRULICITY) 3 YI/9.4WN SOPN Inject 3 mg into the skin once a week.  . furosemide (LASIX) 40 MG tablet Take 1 tablet (40 mg total) by mouth daily. (Patient taking  differently: Take 40 mg by mouth as needed.)  . glipiZIDE (GLUCOTROL) 5 MG tablet Take 1 tablet (5 mg total) by mouth 2 (two) times daily.  Marland Kitchen ibuprofen (ADVIL) 200 MG tablet Take 400 mg by mouth every 6 (six) hours as needed for mild pain.  Marland Kitchen insulin detemir (LEVEMIR FLEXTOUCH) 100 UNIT/ML FlexPen Inject 50 Units into the skin at bedtime.  . Insulin Pen Needle 32G X 4 MM MISC 1 each by Does not apply route at bedtime.  . Magnesium 500 MG TABS Take 500 mg by mouth every evening.  . Melatonin 1 MG CAPS Take 1 mg by mouth at bedtime.  . metFORMIN (GLUCOPHAGE) 1000 MG tablet Take 1 tablet (1,000 mg total) by mouth 2 (two) times daily.  . NP THYROID 90 MG tablet Take 90 mg by mouth daily.  Marland Kitchen omeprazole (PRILOSEC) 20 MG capsule Take 1 capsule (20 mg total) by mouth every other day.  Vladimir Faster Glycol-Propyl Glycol (SYSTANE FREE OP) Place 1 drop into both eyes every 4 (four) hours as needed (Dry eyes).  Marland Kitchen rOPINIRole (REQUIP) 3 MG tablet TAKE 1 TABLET (3 MG TOTAL) BY MOUTH AT BEDTIME.  . rosuvastatin (CRESTOR) 20 MG tablet Take 1 tablet (20 mg total) by mouth daily.  . Testosterone Propionate (FIRST-TESTOSTERONE MC) 2 % CREA Place 5 mg onto the skin daily.  Marland Kitchen trimethoprim-polymyxin b (POLYTRIM) ophthalmic solution Place 1-2 drops into the right eye every 4 (four) hours for 10 days.  . vitamin B-12 (CYANOCOBALAMIN) 1000 MCG tablet Take 1,000 mcg by mouth daily.  . [DISCONTINUED] HYDROcodone-acetaminophen (NORCO/VICODIN) 5-325 MG tablet Take 1 tablet by mouth 4 (four) times daily as needed.      Depression screen [facility] 2/9 05/26/2020  Decreased Interest 0  Down, Depressed, Hopeless 0  PHQ - 2 Score 0  Altered sleeping 0  Tired, decreased energy 0  Change in appetite 0  Feeling bad or failure about yourself  0  Trouble concentrating 0  Moving slowly or fidgety/restless 0  Suicidal thoughts 0  PHQ-9 Score 0  Difficult doing work/chores Not difficult at all     Objective:   Today's Vitals:  BP 114/72   Pulse 94   Temp (!) 96.6 F (35.9 C) (Temporal)   Ht 5' 2.75" (1.594 m)   Wt 205 lb 3.2 oz (93.1 kg)   SpO2 97%   BMI 36.64 kg/m  Vitals with BMI 05/26/2020 05/19/2020 04/09/2020  Height 5' 2.75" 5\' 4"  5\' 4"   Weight 205 lbs 3 oz 203 lbs 13 oz 206 lbs 3 oz  BMI 36.63 46.27 03.50  Systolic 093 818 299  Diastolic 72 82 74  Pulse 94 114 102     Physical Exam   She looks systemically well.  Weight is unchanged.  Blood pressure is in good range.    Assessment   1. Compression fracture of T12 vertebra, initial encounter ([place])   2. Primary hypothyroidism   3. Morbid obesity ([place])   4. Mixed hyperlipidemia   5. Uncontrolled type 2 diabetes mellitus with hyperglycemia ([place])       Tests ordered No orders of the defined types were placed in this encounter.    Plan: 1. I will allow a small amount of hydrocodone temporarily since she has back pain and she is going to be seeing Dr. Rolena Infante soon. 2. She will continue with medications for her hypothyroidism and diabetes per endocrinology. 3. Follow-up with [name] in about 6 months.   Meds ordered this encounter  Medications  . HYDROcodone-acetaminophen (NORCO/VICODIN) 5-325 MG tablet    Sig: Take 1 tablet by mouth 4 (four) times daily as needed.    Dispense:  30 tablet    Refill:  0    Mainor Hellmann Luther Parody, MD

## 2020-06-16 ENCOUNTER — Encounter: Payer: Self-pay | Admitting: Internal Medicine

## 2020-06-16 ENCOUNTER — Encounter (INDEPENDENT_AMBULATORY_CARE_PROVIDER_SITE_OTHER): Payer: Self-pay | Admitting: Internal Medicine

## 2020-06-17 ENCOUNTER — Other Ambulatory Visit (INDEPENDENT_AMBULATORY_CARE_PROVIDER_SITE_OTHER): Payer: Self-pay

## 2020-06-17 DIAGNOSIS — E119 Type 2 diabetes mellitus without complications: Secondary | ICD-10-CM

## 2020-06-17 MED ORDER — METFORMIN HCL 1000 MG PO TABS: 1000.0000 mg | ORAL_TABLET | Freq: Two times a day (BID) | ORAL | 1 refills | Status: DC

## 2020-06-17 MED ORDER — LEVEMIR FLEXTOUCH 100 UNIT/ML ~~LOC~~ SOPN: 50.0000 [IU] | PEN_INJECTOR | Freq: Every day | SUBCUTANEOUS | 1 refills | Status: DC

## 2020-06-17 MED ORDER — ROSUVASTATIN CALCIUM 20 MG PO TABS
20.0000 mg | ORAL_TABLET | Freq: Every day | ORAL | 1 refills | Status: DC
Start: 2020-06-17 — End: 2020-06-25

## 2020-06-17 MED ORDER — ROPINIROLE HCL 3 MG PO TABS: 3.0000 mg | ORAL_TABLET | Freq: Every day | ORAL | 1 refills | Status: DC

## 2020-06-17 MED ORDER — OMEPRAZOLE 20 MG PO CPDR
20.0000 mg | DELAYED_RELEASE_CAPSULE | ORAL | 1 refills | Status: DC
Start: 2020-06-17 — End: 2021-01-22

## 2020-06-25 ENCOUNTER — Other Ambulatory Visit (INDEPENDENT_AMBULATORY_CARE_PROVIDER_SITE_OTHER): Payer: Self-pay

## 2020-06-25 MED ORDER — ROSUVASTATIN CALCIUM 20 MG PO TABS
20.0000 mg | ORAL_TABLET | Freq: Every day | ORAL | 1 refills | Status: DC
Start: 2020-06-25 — End: 2021-01-22

## 2020-06-30 ENCOUNTER — Ambulatory Visit: Payer: Managed Care, Other (non HMO) | Admitting: Internal Medicine

## 2020-07-04 ENCOUNTER — Encounter: Payer: Self-pay | Admitting: Internal Medicine

## 2020-07-04 ENCOUNTER — Other Ambulatory Visit: Payer: Self-pay | Admitting: Internal Medicine

## 2020-07-04 MED ORDER — TRULICITY 3 MG/0.5ML ~~LOC~~ SOAJ
3.0000 mg | SUBCUTANEOUS | 3 refills | Status: DC
Start: 2020-07-04 — End: 2020-10-06

## 2020-08-02 ENCOUNTER — Encounter: Payer: Self-pay | Admitting: Internal Medicine

## 2020-08-08 ENCOUNTER — Other Ambulatory Visit: Payer: Self-pay

## 2020-08-08 ENCOUNTER — Encounter: Payer: Self-pay | Admitting: Internal Medicine

## 2020-08-08 ENCOUNTER — Ambulatory Visit (INDEPENDENT_AMBULATORY_CARE_PROVIDER_SITE_OTHER): Payer: Medicare Other | Admitting: Internal Medicine

## 2020-08-08 VITALS — BP 128/64 | HR 97 | Ht 62.75 in | Wt 207.0 lb

## 2020-08-08 DIAGNOSIS — E1165 Type 2 diabetes mellitus with hyperglycemia: Secondary | ICD-10-CM | POA: Diagnosis not present

## 2020-08-08 DIAGNOSIS — E039 Hypothyroidism, unspecified: Secondary | ICD-10-CM

## 2020-08-08 LAB — POCT GLYCOSYLATED HEMOGLOBIN (HGB A1C): Hemoglobin A1C: 8.4 % — AB (ref 4.0–5.6)

## 2020-08-08 MED ORDER — FREESTYLE LIBRE 2 SENSOR MISC: 1.0000 | 3 refills | Status: DC

## 2020-08-08 MED ORDER — LYUMJEV KWIKPEN 100 UNIT/ML ~~LOC~~ SOPN: PEN_INJECTOR | SUBCUTANEOUS | 3 refills | Status: DC

## 2020-08-08 MED ORDER — FREESTYLE LIBRE 2 READER DEVI: 1.0000 | Freq: Every day | 0 refills | Status: DC

## 2020-08-08 NOTE — Addendum Note (Signed)
Addended by: Elta Guadeloupe on: 08/08/2020 03:15 PM   Modules accepted: Orders

## 2020-08-08 NOTE — Patient Instructions (Addendum)
Please continue: - Metformin 2000 mg at night - Trulicity 3 mg weekly - Levemir 55 units at bedtime  Please stop Glipizide and start: - Lyumjev 8-10 units at the start of each meal  Please continue NP thyroid 90 mg in a.m.  Take the thyroid hormone every day, with water, at least 30 minutes before breakfast, separated by at least 4 hours from: - acid reflux medications - calcium - iron - multivitamins  Please stop at the lab.  Please return in 1.5 months with your sugar log.

## 2020-08-08 NOTE — Progress Notes (Signed)
Patient ID: Megan Church, female   DOB: April 16, 1956, 65 y.o.   MRN: [id number]   This visit occurred during the SARS-CoV-2 public health emergency.  Safety protocols were in place, including screening questions prior to the visit, additional usage of staff PPE, and extensive cleaning of exam room while observing appropriate contact time as indicated for disinfecting solutions.   HPI: Megan Church is a 65 y.o.-year-old female, initially referred by her PCP, Dr. Anastasio Champion, returning for follow-up for DM2, dx in 07/2008, insulin-dependent since 2012, then off, restarted 2020, uncontrolled, without long-term complications.  Last visit almost 6 months ago.  At last visit she was preparing for back surgery. For this, HbA1c had to be lower than 8%.   She contacted me with high blood sugars on 08/02/2020.  At that time, she was not feeling well.  I recommend to increase her insulin dose.  Sugars have improved since then.  DM2: Reviewed HbA1c levels: Lab Results  Component Value Date   HGBA1C 7.2 (A) 02/14/2020   HGBA1C 7.7 (A) 10/18/2019   HGBA1C 7.0 (A) 07/19/2019   HGBA1C 8.7 (H) 04/03/2019   HGBA1C 8.4 (H) 02/20/2019   HGBA1C 7.9 (H) 01/24/2017   Pt is on a regimen of: - Metformin 1000 mg 2x a day, with meals >> 2000 mg with dinner - Glipizide 5 mg 2x a day with meals, moved before meals >> 5 mg before dinner >> 5 mg twice a day - Trulicity 1.5 mg weekly >> 3 mg weekly (increased 03/2019) - Levemir 40 >> 50 units at bedtime - increased to 55 units daily She tried Invokana >> yeast infections. She was on Januvia-stopped 03/2019  Pt checks her sugars 0-2x a day: - am:  122-300 >> > 114-172, 183, 226 >> 175-336 - 2h after b'fast: n/c >> 249 >> n/c >> 133, 269 >> 229-303 - before lunch: n/c >> 132-215, 276 >> 171-219 >> n/c - 2h after lunch:  208-283 >> 146-338 (!) >> 227-254 - before dinner: 53, 77-169 >> 96 >> 127, 144 >> 168-239, 260 - 2h after dinner:  62, 99, 129, 222 >> 204, 282  >> 116, 165 >> n/c - bedtime: n/c >> 131 >> 65, 159-215 >> 133 >> 139 - nighttime: n/c >> 126 >> n/c Lowest sugar was 53 >> 65 >> 114 >> 139; she has hypoglycemia awareness at night the.  Highest sugar was 381 >> 338 >> 336.  Glucometer: [place]  Pt's meals are: - Breakfast: coffee, occasionally eggs - Lunch: tuna salad - Dinner: chicken, rice/potato, salad - Snacks: 2 in the evening She is doing intermittent fasting -fasting window is in the morning.  She takes glipizide occasionally in the morning but mostly before lunch She is walking and gardening several times a week.  -No CKD, last BUN/creatinine:  Lab Results  Component Value Date   BUN 7 (L) 09/05/2019   BUN 7 02/20/2019   CREATININE 0.60 09/05/2019   CREATININE 0.60 02/20/2019  Not on ACE inhibitor/ARB.  -+ HL. Lab Results  Component Value Date   CHOL 159 02/14/2020   HDL 38.60 (L) 02/14/2020   LDLDIRECT 96.0 02/14/2020   TRIG 204.0 (H) 02/14/2020   CHOLHDL 4 02/14/2020   On Crestor 20.  - last eye exam was in 2021: No DR reportedly. She has a history of cataract surgeries.  -no numbness and tingling in her feet. On Neurontin for L leg pain 2/2 back pain. She had two back surgeries in the past.  Pt  has FH of DM in paternal uncle.  Hypothyroidism  -Diagnosed "years" ago -On LT4, now on desiccated thyroid extract (NP thyroid) -She was previously on a very large dose of NP thyroid, 150 mg daily at last visit, with suppressed TSH (also with fatigue, subjective hyperthermia, insomnia).  In the past she was having hot flushes on 105 mg of NP. thyroid daily so we decreased to 90 mg daily - in am - fasting - at least 30 min from b'fast - + calcium later in the day - no iron - + multivitamins later in the day - no PPIs - not on Biotin  TFTs were reviewed: Lab Results  Component Value Date   TSH 4.14 02/14/2020   TSH 3.08 11/27/2019   TSH 0.50 10/18/2019   TSH 1.52 07/19/2019   TSH 2.16 05/22/2019   TSH  0.08 (L) 04/17/2019   TSH 0.05 (L) 02/20/2019   She also has an isthmic thyroid nodule 1.8 cm >> this was biopsied in 2019 with benign results.  Pt denies: - feeling nodules in neck - hoarseness - dysphagia - choking - SOB with lying down  Parents have thyroid ds. No FH of thyroid cancer. No h/o radiation tx to head or neck.  No herbal supplements. No Biotin use. No recent steroids use.   She continues on testosterone compounded cream.  ROS: Constitutional: no weight gain/no weight loss, no fatigue, no subjective hyperthermia, no subjective hypothermia Eyes: no blurry vision, no xerophthalmia ENT: no sore throat, + see HPI Cardiovascular: no CP/no SOB/no palpitations/+ mild leg swelling Respiratory: no cough/no SOB/no wheezing Gastrointestinal: no N/no V/no D/no C/no acid reflux Musculoskeletal: no muscle aches/no joint aches Skin: no rashes, no hair loss Neurological: no tremors/no numbness/no tingling/no dizziness  I reviewed pt's medications, allergies, PMH, social hx, family hx, and changes were documented in the history of present illness. Otherwise, unchanged from my initial visit note.  Past Medical History:  Diagnosis Date  . Anemia   . Arthritis   . Depression   . GERD (gastroesophageal reflux disease)   . HLD (hyperlipidemia) 02/20/2019  . Morbid obesity ([place]) 02/20/2019  . Primary hypothyroidism 02/20/2019  . RLS (restless legs syndrome)   . Shingles   . Sleep apnea   . Type II diabetes mellitus, uncontrolled ([place]) 02/20/2019  . Vitamin D deficiency disease 02/20/2019   Past Surgical History:  Procedure Laterality Date  . BACK SURGERY  2012, 2016   lumbar fusion 2012, cleaning out of area 2016  . BREAST BIOPSY Left 2010  . CHOLECYSTECTOMY  2011  . CLOSED REDUCTION ANKLE FRACTURE    . CLOSED REDUCTION HUMERUS FRACTURE Right 2014  . COLONOSCOPY WITH PROPOFOL N/A 02/21/2018   Procedure: COLONOSCOPY WITH PROPOFOL;  Surgeon: Danie Binder, MD;  [facility];  Service: Endoscopy;  Laterality: N/A;  1:15pm - pt knows to arrive at 10:30  . EYE SURGERY  2016   eye lid droop   . KNEE ARTHROSCOPY Right 2010  . POLYPECTOMY  02/21/2018   Procedure: POLYPECTOMY;  Surgeon: Danie Binder, MD;  [facility];  Service: Endoscopy;;  hepatic flexure polyp  . TOTAL KNEE ARTHROPLASTY Right 01/31/2017   Procedure: RIGHT TOTAL KNEE ARTHROPLASTY;  Surgeon: Paralee Cancel, MD;  [facility];  Service: Orthopedics;  Laterality: Right;  90 mins   Social History   Socioeconomic History  . Marital status: Married    Spouse name: Not on file  . Number of children:  3  .  Years of education: Not on file  . Highest education level: Not on file  Occupational History  .  + Homemaker, previously librarian  Social Needs  . Financial resource strain: Not on file  . Food insecurity    Worry: Not on file    Inability: Not on file  . Transportation needs    Medical: Not on file    Non-medical: Not on file  Tobacco Use  . Smoking status: Former Smoker    Packs/day: 1.00    Years: 8.00    Pack years: 8.00    Types: Cigarettes    Start date: 06/14/1970    Quit date: 06/14/1978    Years since quitting: 40.8  . Smokeless tobacco: Never Used  . Tobacco comment: quit age 30  Substance and Sexual Activity  . Alcohol use: Yes    Alcohol/week:  1-2 drinks daily    Varies       . Drug use: No   Current Outpatient Medications on File Prior to Visit  Medication Sig Dispense Refill  . Calcium Carb-Cholecalciferol (CALCIUM 600/VITAMIN D3 PO) Take 1 tablet by mouth daily.    . Cholecalciferol (VITAMIN D-3) 125 MCG (5000 UT) TABS Take 10,000 Units by mouth daily at 12 noon.    . cyclobenzaprine (FLEXERIL) 10 MG tablet Take 10 mg by mouth as needed.     . Dulaglutide (TRULICITY) 3 BM/8.4XL SOPN Inject 3 mg into the skin once a week. 6 mL 3  . furosemide (LASIX) 40 MG tablet Take 1 tablet (40 mg total) by mouth daily. (Patient taking differently: Take 40 mg  by mouth as needed.) 90 tablet 2  . glipiZIDE (GLUCOTROL) 5 MG tablet Take 1 tablet (5 mg total) by mouth 2 (two) times daily. 180 tablet 1  . HYDROcodone-acetaminophen (NORCO/VICODIN) 5-325 MG tablet Take 1 tablet by mouth 4 (four) times daily as needed. 30 tablet 0  . ibuprofen (ADVIL) 200 MG tablet Take 400 mg by mouth every 6 (six) hours as needed for mild pain.    Marland Kitchen insulin detemir (LEVEMIR FLEXTOUCH) 100 UNIT/ML FlexPen Inject 50 Units into the skin at bedtime. 45 mL 1  . Insulin Pen Needle 32G X 4 MM MISC 1 each by Does not apply route at bedtime. 100 each 6  . Magnesium 500 MG TABS Take 500 mg by mouth every evening.    . Melatonin 1 MG CAPS Take 1 mg by mouth at bedtime.    . metFORMIN (GLUCOPHAGE) 1000 MG tablet Take 1 tablet (1,000 mg total) by mouth 2 (two) times daily. 180 tablet 1  . NP THYROID 90 MG tablet Take 90 mg by mouth daily.    Marland Kitchen omeprazole (PRILOSEC) 20 MG capsule Take 1 capsule (20 mg total) by mouth every other day. 90 capsule 1  . Polyethyl Glycol-Propyl Glycol (SYSTANE FREE OP) Place 1 drop into both eyes every 4 (four) hours as needed (Dry eyes).    Marland Kitchen rOPINIRole (REQUIP) 3 MG tablet Take 1 tablet (3 mg total) by mouth at bedtime. 90 tablet 1  . rosuvastatin (CRESTOR) 20 MG tablet Take 1 tablet (20 mg total) by mouth daily. 90 tablet 1  . Testosterone Propionate (FIRST-TESTOSTERONE MC) 2 % CREA Place 5 mg onto the skin daily. 30 g 0  . vitamin B-12 (CYANOCOBALAMIN) 1000 MCG tablet Take 1,000 mcg by mouth daily.     No current facility-administered medications on file prior to visit.   Allergies  Allergen Reactions  . Sulfa Antibiotics Hives  .  Ace Inhibitors Cough  . Invokana [Canagliflozin] Other (See Comments)    Yeast   . Vytorin [Ezetimibe-Simvastatin] Other (See Comments)    Leg cramps  . Atorvastatin Hives   Family History  Problem Relation Age of Onset  . Cancer Mother   . Heart disease Father   . Stroke Father   . Cancer Father   . Heart disease  Brother   . Hyperlipidemia Brother   . Hyperlipidemia Brother   . Colon cancer Neg Hx     PE: BP 128/64   Pulse 97   Ht 5' 2.75" (1.594 m)   Wt 207 lb (93.9 kg)   SpO2 97%   BMI 36.96 kg/m  Wt Readings from Last 3 Encounters:  08/08/20 207 lb (93.9 kg)  05/26/20 205 lb 3.2 oz (93.1 kg)  05/19/20 203 lb 12.8 oz (92.4 kg)   Constitutional: overweight, in NAD Eyes: PERRLA, EOMI, no exophthalmos ENT: moist mucous membranes, no thyromegaly, no cervical lymphadenopathy Cardiovascular: tachycardia, RR, No MRG, + mild periankle swelling B Respiratory: CTA B Gastrointestinal: abdomen soft, NT, ND, BS+ Musculoskeletal: no deformities, strength intact in all 4 Skin: moist, warm, no rashes Neurological: no tremor with outstretched hands, DTR normal in all 4  ASSESSMENT: 1. DM2, insulin-dependent, uncontrolled, without long-term complications, but with hyperglycemia  2.  Hypothyroidism -Previously uncontrolled  PLAN:  1. Patient with long standing, uncontrolled, DM2, on oral antidiabetic regimen with Metformin and sulfonylurea, also weekly GLP-1 receptor agonist and daily long-acting insulin with still high blood sugars at last visit after dietary indiscretions, despite an HbA1c close to goal, at 7.2%.  At that time, we discussed about improving diet and, after we reviewed different diets,  I suggested to try the [place] diet.  Otherwise, we did not changed her regimen. -at today's visit, sugars are quite high, increased from last OV. We just increased her basal insulin dose slightly, and sugars improved a little in last 3 days, but they are still mostly in the 200s.  Therefore, we discussed about stopping glipizide and starting rapid acting insulin.  Lyumjev appears to be covered and I advised her to start at 8 units before each meal, increase to 10 units before a larger meal.  I did advise her that if this is not covered and we end up with Humalog or NovoLog, these are injected 15 units before  each meal. -Since on multiple doses of insulin a day she needs to check sugars consistently, I think she would be a good candidate for a CGM.  I explained how these work and send the prescription for the freestyle libre CGM to her pharmacy - I suggested to:  Patient Instructions  Please continue: - Metformin 2000 mg at night - Trulicity 3 mg weekly - Levemir 55 units at bedtime  Please stop Glipizide and start: - Lyumjev 8-10 units at the start of each meal  Please continue NP thyroid 90 mg in a.m.  Take the thyroid hormone every day, with water, at least 30 minutes before breakfast, separated by at least 4 hours from: - acid reflux medications - calcium - iron - multivitamins  Please stop at the lab.  Please return in 1.5 months with your sugar log.  - we checked her HbA1c: 8.4% (higher) - advised to check sugars at different times of the day - 4x a day, rotating check times - advised for yearly eye exams >> she is UTD - return to clinic in 1.70months  2.  Hypothyroidism - latest thyroid  labs reviewed with pt >> normal: Lab Results  Component Value Date   TSH 4.14 02/14/2020   - she continues on NP 90 mg daily (she has to make this out for other doses as the 90 mg tablet is not available).  At last visit, she was open to switching back to [place] needed. - pt feels good on this dose. - we discussed about taking the thyroid hormone every day, with water, >30 minutes before breakfast, separated by >4 hours from acid reflux medications, calcium, iron, multivitamins. Pt. is taking it correctly. - will check thyroid tests at next visit  Philemon Kingdom, MD PhD [facility] Endocrinology

## 2020-08-09 ENCOUNTER — Encounter: Payer: Self-pay | Admitting: Internal Medicine

## 2020-08-13 ENCOUNTER — Telehealth: Payer: Self-pay | Admitting: Internal Medicine

## 2020-08-13 DIAGNOSIS — E1165 Type 2 diabetes mellitus with hyperglycemia: Secondary | ICD-10-CM

## 2020-08-13 MED ORDER — FREESTYLE LIBRE 2 READER DEVI: 1.0000 | Freq: Every day | 0 refills | Status: DC

## 2020-08-13 MED ORDER — FREESTYLE LIBRE 2 SENSOR MISC: 1.0000 | 3 refills | Status: DC

## 2020-08-13 NOTE — Telephone Encounter (Signed)
Patient called re: [id number] - [place] [place] Phone:  [phone]  Fax:  [phone]     Patient requests Diagnosis Code for the RX's for [facility] 2  Reader and Sensors be sent to [facility] listed above (Patient states the above listed PHARM sent forms to Dr. Cruzita Lederer with no response).

## 2020-08-13 NOTE — Telephone Encounter (Signed)
Rx sent to preferred pharmacy.

## 2020-08-15 ENCOUNTER — Encounter: Payer: Self-pay | Admitting: Internal Medicine

## 2020-08-19 ENCOUNTER — Telehealth: Payer: Self-pay

## 2020-08-19 NOTE — Telephone Encounter (Signed)
Inbound fax from [facility] requesting clinical notes. Last OV notes routed via Epic to [phone].

## 2020-08-20 ENCOUNTER — Encounter: Payer: Self-pay | Admitting: Internal Medicine

## 2020-08-22 ENCOUNTER — Encounter (INDEPENDENT_AMBULATORY_CARE_PROVIDER_SITE_OTHER): Payer: Self-pay | Admitting: Internal Medicine

## 2020-08-29 NOTE — Telephone Encounter (Signed)
Clinical notes provided.

## 2020-09-01 ENCOUNTER — Ambulatory Visit: Payer: Self-pay | Admitting: Orthopedic Surgery

## 2020-09-23 ENCOUNTER — Ambulatory Visit (INDEPENDENT_AMBULATORY_CARE_PROVIDER_SITE_OTHER): Payer: Medicare Other | Admitting: Internal Medicine

## 2020-09-23 ENCOUNTER — Encounter: Payer: Self-pay | Admitting: Internal Medicine

## 2020-09-23 ENCOUNTER — Other Ambulatory Visit: Payer: Self-pay

## 2020-09-23 ENCOUNTER — Other Ambulatory Visit (INDEPENDENT_AMBULATORY_CARE_PROVIDER_SITE_OTHER): Payer: Medicare Other

## 2020-09-23 VITALS — BP 130/82 | HR 93 | Ht 62.75 in | Wt 205.4 lb

## 2020-09-23 DIAGNOSIS — E039 Hypothyroidism, unspecified: Secondary | ICD-10-CM

## 2020-09-23 DIAGNOSIS — E1165 Type 2 diabetes mellitus with hyperglycemia: Secondary | ICD-10-CM

## 2020-09-23 DIAGNOSIS — E782 Mixed hyperlipidemia: Secondary | ICD-10-CM | POA: Diagnosis not present

## 2020-09-23 LAB — POCT GLYCOSYLATED HEMOGLOBIN (HGB A1C): Hemoglobin A1C: 8.6 % — AB (ref 4.0–5.6)

## 2020-09-23 LAB — TSH: TSH: 1.18 u[IU]/mL (ref 0.35–4.50)

## 2020-09-23 LAB — T3, FREE: T3, Free: 3.1 pg/mL (ref 2.3–4.2)

## 2020-09-23 LAB — T4, FREE: Free T4: 0.63 ng/dL (ref 0.60–1.60)

## 2020-09-23 NOTE — Progress Notes (Signed)
Patient ID: Megan Church, female   DOB: 11/25/1955, 65 y.o.   MRN: [id number]   This visit occurred during the SARS-CoV-2 public health emergency.  Safety protocols were in place, including screening questions prior to the visit, additional usage of staff PPE, and extensive cleaning of exam room while observing appropriate contact time as indicated for disinfecting solutions.   HPI: Megan Church is a 65 y.o.-year-old female, initially referred by her PCP, Dr. Anastasio Champion, returning for follow-up for DM2, dx in 07/2008, insulin-dependent since 2012, then off, restarted 2020, uncontrolled, without long-term complications.  Last visit almost 6 months ago.  Interim history: She is preparing for back surgery next month.  For this, HbA1c needs to be lower than 8%. She describes that her sugars improved significantly after we added Lyumjev at last visit. She was also able to start the freestyle libre 2 CGM approximately 3 weeks ago.  DM2: Reviewed HbA1c levels: Lab Results  Component Value Date   HGBA1C 8.4 (A) 08/08/2020   HGBA1C 7.2 (A) 02/14/2020   HGBA1C 7.7 (A) 10/18/2019   HGBA1C 7.0 (A) 07/19/2019   HGBA1C 8.7 (H) 04/03/2019   HGBA1C 8.4 (H) 02/20/2019   HGBA1C 7.9 (H) 01/24/2017   Pt is on a regimen of: - Metformin 1000 mg 2x a day, with meals >> 2000 mg with dinner - Trulicity 1.5 mg weekly >> 3 mg weekly (increased 03/2019) - Levemir 40 >> 50 >> 55 units daily - Lyumjev 8-10 units before each meal We stopped glipizide in 07/2020 and switched to Lyumjev. She tried Invokana >> yeast infections. She was on Januvia-stopped 03/2019  Pt checks her sugars 4x a day with her newly obtained freestyle libre 2 CGM:   Previously: - am:  122-300 >> > 114-172, 183, 226 >> 175-336 - 2h after b'fast: n/c >> 249 >> n/c >> 133, 269 >> 229-303 - before lunch: n/c >> 132-215, 276 >> 171-219 >> n/c - 2h after lunch:  208-283 >> 146-338 (!) >> 227-254 - before dinner: 53, 77-169 >> 96 >>  127, 144 >> 168-239, 260 - 2h after dinner:  62, 99, 129, 222 >> 204, 282 >> 116, 165 >> n/c - bedtime: n/c >> 131 >> 65, 159-215 >> 133 >> 139 - nighttime: n/c >> 126 >> n/c Lowest sugar was 53 >> 65 >> 114 >> 139 >> 70s. Highest sugar was 381 >> 338 >> 336 >> 250.  Glucometer: [place]  Pt's meals are: - Breakfast: coffee, occasionally eggs - Lunch: tuna salad - Dinner: chicken, rice/potato, salad - Snacks: 2 in the evening She is doing intermittent fasting -fasting window is in the morning.  She takes glipizide occasionally in the morning but mostly before lunch She is walking and gardening several times a week.  -No CKD, last BUN/creatinine:  Lab Results  Component Value Date   BUN 7 (L) 09/05/2019   BUN 7 02/20/2019   CREATININE 0.60 09/05/2019   CREATININE 0.60 02/20/2019  Not on ACE inhibitor/ARB.  -+ HL. Lab Results  Component Value Date   CHOL 159 02/14/2020   HDL 38.60 (L) 02/14/2020   LDLDIRECT 96.0 02/14/2020   TRIG 204.0 (H) 02/14/2020   CHOLHDL 4 02/14/2020  On Crestor 20.  - last eye exam was in 2021: No DR reportedly. She has a history of cataract surgeries.  -no numbness and tingling in her feet. On Neurontin for L leg pain 2/2 back pain. She had two back surgeries in the past.  Pt has FH of  DM in paternal uncle.  Hypothyroidism  -Diagnosed "years" ago -On LT4, now on desiccated thyroid extract (NP thyroid) -She was previously on a very large dose of NP thyroid, 150 mg daily, with suppressed TSH (also with fatigue, subjective hyperthermia, insomnia).  In the past she was having hot flushes on 105 mg of NP thyroid daily so we decreased to 90 mg daily.  She takes this: - in am - fasting - at least 30 min from b'fast - + calcium later in the day - no iron - + multivitamins later in the day - no PPIs - not on Biotin  TFTs were reviewed: Lab Results  Component Value Date   TSH 4.14 02/14/2020   TSH 3.08 11/27/2019   TSH 0.50 10/18/2019   TSH  1.52 07/19/2019   TSH 2.16 05/22/2019   TSH 0.08 (L) 04/17/2019   TSH 0.05 (L) 02/20/2019   She also has an isthmic thyroid nodule 1.8 cm >> this was biopsied in 2019 with benign results.  Pt denies: - feeling nodules in neck - hoarseness - dysphagia - choking - SOB with lying down  Parents have thyroid ds. No FH of thyroid cancer. No h/o radiation tx to head or neck.  No herbal supplements. No Biotin use. No recent steroids use.   She continues on testosterone compounded cream.  ROS: Constitutional: no weight gain/no weight loss, no fatigue, no subjective hyperthermia, no subjective hypothermia Eyes: no blurry vision, no xerophthalmia ENT: no sore throat, + see HPI Cardiovascular: no CP/no SOB/no palpitations/no leg swelling Respiratory: no cough/no SOB/no wheezing Gastrointestinal: no N/no V/no D/no C/no acid reflux Musculoskeletal: no muscle aches/no joint aches Skin: no rashes, no hair loss Neurological: no tremors/no numbness/no tingling/no dizziness  I reviewed pt's medications, allergies, PMH, social hx, family hx, and changes were documented in the history of present illness. Otherwise, unchanged from my initial visit note.  Past Medical History:  Diagnosis Date  . Anemia   . Arthritis   . Depression   . GERD (gastroesophageal reflux disease)   . HLD (hyperlipidemia) 02/20/2019  . Morbid obesity ([place]) 02/20/2019  . Primary hypothyroidism 02/20/2019  . RLS (restless legs syndrome)   . Shingles   . Sleep apnea   . Type II diabetes mellitus, uncontrolled ([place]) 02/20/2019  . Vitamin D deficiency disease 02/20/2019   Past Surgical History:  Procedure Laterality Date  . BACK SURGERY  2012, 2016   lumbar fusion 2012, cleaning out of area 2016  . BREAST BIOPSY Left 2010  . CHOLECYSTECTOMY  2011  . CLOSED REDUCTION ANKLE FRACTURE    . CLOSED REDUCTION HUMERUS FRACTURE Right 2014  . COLONOSCOPY WITH PROPOFOL N/A 02/21/2018   Procedure: COLONOSCOPY WITH PROPOFOL;  Surgeon:  Danie Binder, MD;  Location: [facility];  Service: Endoscopy;  Laterality: N/A;  1:15pm - pt knows to arrive at 10:30  . EYE SURGERY  2016   eye lid droop   . KNEE ARTHROSCOPY Right 2010  . POLYPECTOMY  02/21/2018   Procedure: POLYPECTOMY;  Surgeon: Danie Binder, MD;  Location: [facility];  Service: Endoscopy;;  hepatic flexure polyp  . TOTAL KNEE ARTHROPLASTY Right 01/31/2017   Procedure: RIGHT TOTAL KNEE ARTHROPLASTY;  Surgeon: Paralee Cancel, MD;  Location: [facility];  Service: Orthopedics;  Laterality: Right;  90 mins   Social History   Socioeconomic History  . Marital status: Married    Spouse name: Not on file  . Number of children:  3  . Years of  education: Not on file  . Highest education level: Not on file  Occupational History  .  + Homemaker, previously librarian  Social Needs  . Financial resource strain: Not on file  . Food insecurity    Worry: Not on file    Inability: Not on file  . Transportation needs    Medical: Not on file    Non-medical: Not on file  Tobacco Use  . Smoking status: Former Smoker    Packs/day: 1.00    Years: 8.00    Pack years: 8.00    Types: Cigarettes    Start date: 06/14/1970    Quit date: 06/14/1978    Years since quitting: 40.8  . Smokeless tobacco: Never Used  . Tobacco comment: quit age 50  Substance and Sexual Activity  . Alcohol use: Yes    Alcohol/week:  1-2 drinks daily    Varies       . Drug use: No   Current Outpatient Medications on File Prior to Visit  Medication Sig Dispense Refill  . Calcium Carb-Cholecalciferol (CALCIUM 600/VITAMIN D3 PO) Take 1 tablet by mouth daily.    . Cholecalciferol (VITAMIN D-3) 125 MCG (5000 UT) TABS Take 10,000 Units by mouth daily at 12 noon.    . Continuous Blood Gluc Receiver (FREESTYLE LIBRE 2 READER) DEVI 1 each by Does not apply route daily. 1 each 0  . Continuous Blood Gluc Sensor (FREESTYLE LIBRE 2 SENSOR) MISC 1 each by Does not apply route every 14 (fourteen) days. 6 each 3  .  Dulaglutide (TRULICITY) 3 XM/4.6OE SOPN Inject 3 mg into the skin once a week. 6 mL 3  . furosemide (LASIX) 40 MG tablet Take 1 tablet (40 mg total) by mouth daily. (Patient taking differently: Take 40 mg by mouth as needed.) 90 tablet 2  . HYDROcodone-acetaminophen (NORCO/VICODIN) 5-325 MG tablet Take 1 tablet by mouth 4 (four) times daily as needed. 30 tablet 0  . ibuprofen (ADVIL) 200 MG tablet Take 400 mg by mouth every 6 (six) hours as needed for mild pain.    Marland Kitchen insulin detemir (LEVEMIR FLEXTOUCH) 100 UNIT/ML FlexPen Inject 50 Units into the skin at bedtime. 45 mL 1  . Insulin Lispro-aabc, 1 U Dial, (LYUMJEV KWIKPEN) 100 UNIT/ML SOPN Inject 8-10 units before meals under skin 30 mL 3  . Insulin Pen Needle 32G X 4 MM MISC 1 each by Does not apply route at bedtime. 100 each 6  . Magnesium 500 MG TABS Take 500 mg by mouth every evening.    . Melatonin 1 MG CAPS Take 1 mg by mouth at bedtime.    . metFORMIN (GLUCOPHAGE) 1000 MG tablet Take 1 tablet (1,000 mg total) by mouth 2 (two) times daily. 180 tablet 1  . NP THYROID 90 MG tablet Take 90 mg by mouth daily.    Marland Kitchen omeprazole (PRILOSEC) 20 MG capsule Take 1 capsule (20 mg total) by mouth every other day. 90 capsule 1  . Polyethyl Glycol-Propyl Glycol (SYSTANE FREE OP) Place 1 drop into both eyes every 4 (four) hours as needed (Dry eyes).    Marland Kitchen rOPINIRole (REQUIP) 3 MG tablet Take 1 tablet (3 mg total) by mouth at bedtime. 90 tablet 1  . rosuvastatin (CRESTOR) 20 MG tablet Take 1 tablet (20 mg total) by mouth daily. 90 tablet 1  . Testosterone Propionate (FIRST-TESTOSTERONE MC) 2 % CREA Place 5 mg onto the skin daily. 30 g 0  . vitamin B-12 (CYANOCOBALAMIN) 1000 MCG tablet Take 1,000 mcg  by mouth daily.     No current facility-administered medications on file prior to visit.   Allergies  Allergen Reactions  . Sulfa Antibiotics Hives  . Ace Inhibitors Cough  . Invokana [Canagliflozin] Other (See Comments)    Yeast   . Vytorin  [Ezetimibe-Simvastatin] Other (See Comments)    Leg cramps  . Atorvastatin Hives   Family History  Problem Relation Age of Onset  . Cancer Mother   . Heart disease Father   . Stroke Father   . Cancer Father   . Heart disease Brother   . Hyperlipidemia Brother   . Hyperlipidemia Brother   . Colon cancer Neg Hx     PE: BP 130/82 (BP Location: Right Arm, Patient Position: Sitting, Cuff Size: Normal)   Pulse 93   Ht 5' 2.75" (1.594 m)   Wt 205 lb 6.4 oz (93.2 kg)   SpO2 97%   BMI 36.68 kg/m  Wt Readings from Last 3 Encounters:  09/23/20 205 lb 6.4 oz (93.2 kg)  08/08/20 207 lb (93.9 kg)  05/26/20 205 lb 3.2 oz (93.1 kg)   Constitutional: overweight, in NAD Eyes: PERRLA, EOMI, no exophthalmos ENT: moist mucous membranes, no thyromegaly, no cervical lymphadenopathy Cardiovascular: tachycardia, RR, No MRG Respiratory: CTA B Gastrointestinal: abdomen soft, NT, ND, BS+ Musculoskeletal: no deformities, strength intact in all 4 Skin: moist, warm, no rashes Neurological: no tremor with outstretched hands, DTR normal in all 4  ASSESSMENT: 1. DM2, insulin-dependent, uncontrolled, without long-term complications, but with hyperglycemia  2.  Hypothyroidism -Previously uncontrolled  PLAN:  1. Patient with longstanding, uncontrolled, type 2 diabetes, on oral antidiabetic regimen with Metformin and also weekly GLP-1 receptor agonist and basal-bolus insulin regimen, with ultra rapid acting insulin added at last visit.  At that time, we stopped her sulfonylurea.  Sugars were quite high, increased from before, mostly in the 200s.  HbA1c was higher, at 8.4%.  She has to have an HbA1c lower than 8% to qualify for surgery.  Surgery is planned for 10/2020. CGM interpretation: -At today's visit, we reviewed her CGM downloads: It appears that 86% of values are in target range (goal >70%), while 13% are higher than 180 (goal <25%), and 1% are lower than 70 (goal <4%).  The calculated average blood  sugar is 134.  The projected HbA1c for the next 3 months (GMI) is 6.5%. -Reviewing the CGM trends, there is significant improvement in her blood sugars in the last 2 weeks.  The sugars are at goal overnight and they become more variable after lunch, with either normal blood sugars or hyperglycemia after this meal.  Also, after dinner, she has higher blood sugars, occasionally above our target of less than 180.  I advised her to use 10 units of insulin before these meals especially if she has a larger meal or a meal with more carbs.  Otherwise, due to the significant improvement in the blood sugar and the fact that the vast majority of her blood sugars are at goal, I would not recommend any changes in her regimen. -I will clear her for surgery from the diabetes point of view - I suggested to:  Patient Instructions  Please continue: - Metformin 2000 mg at night - Trulicity 3 mg weekly - Levemir 55 units at bedtime - Lyumjev 8-10 units at the start of each meal  Please continue NP thyroid 90 mg in a.m.  Take the thyroid hormone every day, with water, at least 30 minutes before breakfast, separated by at  least 4 hours from: - acid reflux medications - calcium - iron - multivitamins  Please stop at the lab.  Please return in 3 months.  - we checked her HbA1c: 8.6% (slightly higher) - advised to check sugars at different times of the day - 4x a day, rotating check times - advised for yearly eye exams >> she is UTD - return to clinic in 3 months  2.  Hypothyroidism - latest thyroid labs reviewed with pt >> normal: Lab Results  Component Value Date   TSH 4.14 02/14/2020   - she continues on NP thyroid 90 mg daily (she is open to switching back to [place] if NP thyroid becomes not available) - pt feels good on this dose. - we discussed about taking the thyroid hormone every day, with water, >30 minutes before breakfast, separated by >4 hours from acid reflux medications, calcium, iron,  multivitamins. Pt. is taking it correctly. - will check thyroid tests today: TSH, free T3, and fT4 - If labs are abnormal, she will need to return for repeat TFTs in 1.5 months  Component     Latest Ref Rng & Units 09/23/2020  Triiodothyronine,Free,Serum     2.3 - 4.2 pg/mL 3.1  TSH     0.35 - 4.50 uIU/mL 1.18  T4,Free(Direct)     0.60 - 1.60 ng/dL 0.63  TFTs are normal.   Philemon Kingdom, MD PhD [facility] Endocrinology

## 2020-09-23 NOTE — Patient Instructions (Addendum)
Please continue: - Metformin 2000 mg at night - Trulicity 3 mg weekly - Levemir 55 units at bedtime - Lyumjev 8-10 units at the start of each meal (use the higher dose later in the days)  Please continue NP thyroid 90 mg in a.m.  Take the thyroid hormone every day, with water, at least 30 minutes before breakfast, separated by at least 4 hours from: - acid reflux medications - calcium - iron - multivitamins  Please stop at the lab.  Please return in 3 months.

## 2020-09-30 ENCOUNTER — Encounter: Payer: Self-pay | Admitting: Internal Medicine

## 2020-10-06 ENCOUNTER — Ambulatory Visit: Payer: Self-pay | Admitting: Orthopedic Surgery

## 2020-10-06 ENCOUNTER — Encounter: Payer: Self-pay | Admitting: Internal Medicine

## 2020-10-06 DIAGNOSIS — E1165 Type 2 diabetes mellitus with hyperglycemia: Secondary | ICD-10-CM

## 2020-10-06 MED ORDER — TRULICITY 3 MG/0.5ML ~~LOC~~ SOAJ: 3.0000 mg | SUBCUTANEOUS | 3 refills | Status: DC

## 2020-10-06 NOTE — H&P (Signed)
Subjective:   The patient is here today for a pre-operative History and Physical. They are scheduled for XLIF L2-4 DAY 1, DECOMPRESSION, FUSION L2-5, KYPHOPLASTY T12 DAY 2 on 10-15-20 AND 10-16-20 with Dr. Rolena Infante at [facility].  Patient Active Problem List   Diagnosis Date Noted  . Type II diabetes mellitus, uncontrolled ([place]) 02/20/2019  . Primary hypothyroidism 02/20/2019  . Vitamin D deficiency disease 02/20/2019  . Morbid obesity ([place]) 02/20/2019  . GERD (gastroesophageal reflux disease) 02/20/2019  . HLD (hyperlipidemia) 02/20/2019  . Personal history of colonic polyps 12/08/2017  . S/P right TKA 01/31/2017   Past Medical History:  Diagnosis Date  . Anemia   . Arthritis   . Depression   . GERD (gastroesophageal reflux disease)   . HLD (hyperlipidemia) 02/20/2019  . Morbid obesity ([place]) 02/20/2019  . Primary hypothyroidism 02/20/2019  . RLS (restless legs syndrome)   . Shingles   . Sleep apnea   . Type II diabetes mellitus, uncontrolled ([place]) 02/20/2019  . Vitamin D deficiency disease 02/20/2019    Past Surgical History:  Procedure Laterality Date  . BACK SURGERY  2012, 2016   lumbar fusion 2012, cleaning out of area 2016  . BREAST BIOPSY Left 2010  . CHOLECYSTECTOMY  2011  . CLOSED REDUCTION ANKLE FRACTURE    . CLOSED REDUCTION HUMERUS FRACTURE Right 2014  . COLONOSCOPY WITH PROPOFOL N/A 02/21/2018   Procedure: COLONOSCOPY WITH PROPOFOL;  Surgeon: Danie Binder, MD;  Location: [facility];  Service: Endoscopy;  Laterality: N/A;  1:15pm - pt knows to arrive at 10:30  . EYE SURGERY  2016   eye lid droop   . KNEE ARTHROSCOPY Right 2010  . POLYPECTOMY  02/21/2018   Procedure: POLYPECTOMY;  Surgeon: Danie Binder, MD;  Location: [facility];  Service: Endoscopy;;  hepatic flexure polyp  . TOTAL KNEE ARTHROPLASTY Right 01/31/2017   Procedure: RIGHT TOTAL KNEE ARTHROPLASTY;  Surgeon: Paralee Cancel, MD;  Location: [facility];  Service: Orthopedics;  Laterality: Right;  90  mins    Current Outpatient Medications  Medication Sig Dispense Refill Last Dose  . calcium carbonate (OSCAL) 1500 (600 Ca) MG TABS tablet Take 600 mg of elemental calcium by mouth daily with breakfast.     . Cholecalciferol (VITAMIN D-3) 125 MCG (5000 UT) TABS Take 10,000 Units by mouth daily at 12 noon.     . Continuous Blood Gluc Receiver (FREESTYLE LIBRE 2 READER) DEVI 1 each by Does not apply route daily. 1 each 0   . Continuous Blood Gluc Sensor (FREESTYLE LIBRE 2 SENSOR) MISC 1 each by Does not apply route every 14 (fourteen) days. 6 each 3   . Dulaglutide (TRULICITY) 3 OE/7.0JJ SOPN Inject 3 mg into the skin once a week. 6 mL 3   . furosemide (LASIX) 40 MG tablet Take 1 tablet (40 mg total) by mouth daily. (Patient taking differently: Take 40 mg by mouth daily as needed for edema.) 90 tablet 2   . HYDROcodone-acetaminophen (NORCO/VICODIN) 5-325 MG tablet Take 1 tablet by mouth 4 (four) times daily as needed. (Patient taking differently: Take 1 tablet by mouth 4 (four) times daily as needed for severe pain.) 30 tablet 0   . insulin detemir (LEVEMIR FLEXTOUCH) 100 UNIT/ML FlexPen Inject 50 Units into the skin at bedtime. (Patient taking differently: Inject 55 Units into the skin at bedtime.) 45 mL 1   . Insulin Lispro-aabc, 1 U Dial, (LYUMJEV KWIKPEN) 100 UNIT/ML SOPN Inject 8-10 units before meals under  skin 30 mL 3   . Insulin Pen Needle 32G X 4 MM MISC 1 each by Does not apply route at bedtime. 100 each 6   . Magnesium 500 MG TABS Take 500 mg by mouth every evening.     . melatonin 3 MG TABS tablet Take 3 mg by mouth at bedtime.     . metFORMIN (GLUCOPHAGE) 1000 MG tablet Take 1 tablet (1,000 mg total) by mouth 2 (two) times daily. 180 tablet 1   . NP THYROID 90 MG tablet Take 90 mg by mouth daily.     Marland Kitchen omeprazole (PRILOSEC) 20 MG capsule Take 1 capsule (20 mg total) by mouth every other day. 90 capsule 1   . Polyethyl Glycol-Propyl Glycol (SYSTANE FREE OP) Place 1 drop into both eyes  every 4 (four) hours as needed (Dry eyes).     Marland Kitchen rOPINIRole (REQUIP) 3 MG tablet Take 1 tablet (3 mg total) by mouth at bedtime. 90 tablet 1   . rosuvastatin (CRESTOR) 20 MG tablet Take 1 tablet (20 mg total) by mouth daily. 90 tablet 1   . Testosterone Propionate (FIRST-TESTOSTERONE MC) 2 % CREA Place 5 mg onto the skin daily. 30 g 0   . vitamin B-12 (CYANOCOBALAMIN) 1000 MCG tablet Take 1,000 mcg by mouth daily.      No current facility-administered medications for this visit.   Allergies  Allergen Reactions  . Sulfa Antibiotics Hives  . Ace Inhibitors Cough  . Invokana [Canagliflozin] Other (See Comments)    Yeast   . Vytorin [Ezetimibe-Simvastatin] Other (See Comments)    Leg cramps  . Atorvastatin Hives    Social History   Tobacco Use  . Smoking status: Former Smoker    Packs/day: 1.00    Years: 8.00    Pack years: 8.00    Types: Cigarettes    Start date: 06/14/1970    Quit date: 06/14/1978    Years since quitting: 42.3  . Smokeless tobacco: Never Used  . Tobacco comment: quit age 61  Substance Use Topics  . Alcohol use: Yes    Alcohol/week: 14.0 standard drinks    Types: 7 Glasses of wine, 7 Shots of liquor per week    Family History  Problem Relation Age of Onset  . Cancer Mother   . Heart disease Father   . Stroke Father   . Cancer Father   . Heart disease Brother   . Hyperlipidemia Brother   . Hyperlipidemia Brother   . Colon cancer Neg Hx     Review of Systems Pertinent items are noted in HPI.  Objective:   Vitals:  Ht: 5 ft 3.25 in 10/06/2020 01:27 pm BP: 142/81 10/06/2020 01:31 pm Pulse: 92 bpm 10/06/2020 01:31 pm  Clinical exam: Patient is alert and oriented 3. No shortness of breath, chest pain.  Heart: Regular rate and rhythm, no rubs, murmurs, or gallops  Lungs: Clear to auscultation bilaterally  Abdomen: Soft and nontender. No loss of bowel and bladder control, no rebound tenderness. Bowel sounds 4  Lumbar spine: Ongoing significant  back pain with radiation into the lower extremity. Pain radiates into the paraspinal region. No SI joint pain with direct palpation.  Neuro: Left neuropathic radicular leg pain. No significant radicular leg pain in the riight lower extremity. 5/5 motor strength in the lower extremity. Negative straight leg raise test. Reflexes: Babinski: Negative Lumbar MRI: completed on 08/18/20 was reviewed with the patient. It was completed at [facility]; I have independently reviewed the images as  well as the radiology report. No significant change from her prior MRI of 08/13/19. Continues to have subacute compression fracture T12. Significant left sided synovial cyst at L2-3. Severe lateral recess stenosis at L2-3. Positive degenerative disc disease L2-3 and L3-4. Right lateral recess stenosis at L3-4, and severe foraminal stenosis.  Assessment:   I have gone over the MRI with Raisa and her husband and all of their questions were addressed. Clinically she has significant back buttock and radicular left leg pain. 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 The pain at her bra strap has improved and therefore I do not think the T12 compression fracture is causing the patient any significant pain.   Plan:    Lateral interbody fusion L2-3 and L3-4. This would allow [place] to address the foraminal stenosis at L2-3 and L3-4. Possible Posterior L2-3 left-sided facetectomy and removal of synovial cyst-- this will be done depending on the results of the day one procedure and the indirect Decompression given by the intervertebral cage. If this is enough to reduce the patient's radicular leg pain, then we may be able to do posterior pedicle screws without the decompression and decrease surgical time.  Extension of the existing pedicle screw construct from L4-5 to L2-3. This  will require removal of the hardware and possible reinsertion of new pedicle screws with extension up to the L2 level.  Patient thoracic/bra strap pain has resolved And therefore do not think the T12 kyphoplasty is causing her any significant discomfort we will no longer plan to do the T12 kyphoplasty. This will also cut down on the surgical time.   I have gone over this in great detail with the patient and her husband and all of her questions were addressed. We will plan on doing this as a staged to date procedure. I reviewed the risks, benefits, alternatives with the patient and her husband and they have expressed an understanding as well as willingness to move forward with surgery.  OLIF/XLIF risks, benefits of surgery were reviewed with the patient. These include: infection, bleeding, death, stroke, paralysis, ongoing or worse pain, need for additional surgery, injury to the lumbar plexus resulting in hip flexor weakness and difficulty walking without assistive devices. Adjacent segment degenerative disease, need for additional surgery including fusing other levels, leak of spinal fluid, Nonunion, hardware failure, breakage, or mal-position. Deep venous thrombosis (DVT) requiring additional treatment such as filter, and/or medications. Injury to abdominal contents, loss in bowel and bladder control.   Risks and benefits of spinal fusion: Infection, bleeding, death, stroke, paralysis, ongoing or worse pain, need for additional surgery, nonunion, leak of spinal fluid, adjacent segment degeneration requiring additional fusion surgery, Injury to abdominal vessels that can require anterior surgery to stop bleeding. Malposition of the cage and/or pedicle screws that could require additional surgery. Loss of bowel and bladder control. Postoperative hematoma causing neurologic compression that could require urgent or emergent re-operation.  Risks and benefits of decompression/discectomy: Infection, bleeding,  death, stroke, paralysis, ongoing or worse pain, need for additional surgery, leak of spinal fluid, adjacent segment degeneration requiring additional surgery, post-operative hematoma formation that can result in neurological compromise and the need for urgent/emergent re-operation. Loss in bowel and bladder control. Injury to major vessels that could result in the need for urgent abdominal surgery to stop bleeding. Risk of deep venous thrombosis (DVT) and the need for  additional treatment. Recurrent disc herniation resulting in the need for revision surgery, which could include fusion surgery (utilizing instrumentation such as pedicle screws and intervertebral cages).  Additional risk: If instrumentation is used there is a risk of migration, or breakage of that hardware that could require additional surgery.  We have received preoperative medical clearance from the patient's primary care and her endocrinologist. Unfortunately, her most recent A1c taken 2 weeks ago was 8.6. We discussed at length with the patient that this increases her risks of surgery including wound infection, wound healing issues, and chance of fusion. Despite these risks, the patient has elected to proceed with the surgery. We have made some changes to the surgical plan in order to decrease her risk by decreasing the surgical length and time. Given her improvement in her thoracic pain, we would no longer need to do the T12 kyphoplasty. Additionally we will plan to move forward with a decompression only if necessary if the patient continues to have radicular leg pain after part 1 of the surgery when the intervertebral cages have been placed and there is indirect decompression of the neural foramen. If she continues to have pain despite the indirect decompression from the intervertebral cages then we will proceed with the decompression. Patient expressed an understanding of this.  I reviewed the patient's medication list with her. She is  not on any blood thinners. Not taking aspirin products. No anti-inflammatories. Advised her not to use any anti-inflammatories or aspirin products leading up to surgery. She expressed understanding of this.  We have also discussed the post-operative recovery period to include: bathing/showering restrictions, wound healing, activity (and driving) restrictions, medications/pain mangement.  We have also discussed post-operative redflags to include: signs and symptoms of postoperative infection, DVT/PE.  Follow-up: 2 weeks postop

## 2020-10-06 NOTE — Telephone Encounter (Signed)
Pt advised Rx sent to preferred pharmacy.

## 2020-10-06 NOTE — H&P (Deleted)
  The note originally documented on this encounter has been moved the the encounter in which it belongs.  

## 2020-10-10 ENCOUNTER — Encounter: Payer: Self-pay | Admitting: Internal Medicine

## 2020-10-13 NOTE — Pre-Procedure Instructions (Signed)
Surgical Instructions    Your procedure is scheduled on Wednesday, May 4th.  Report to [facility] Main Entrance "A" at 6: A.M., then check in with the Admitting office.  Call this number if you have problems the morning of surgery:  [phone]   If you have any questions prior to your surgery date call [phone]: Open Monday-Friday 8am-4pm    Remember:  Do not eat after midnight the night before your surgery  You may drink clear liquids until 5:30 A.M. the morning of your surgery.   Clear liquids allowed are: Water, Non-Citrus Juices (without pulp), Carbonated Beverages, Clear Tea, Black Coffee Only, and Gatorade. (Please chose diet or sugar-free options)    Take these medicines the morning of surgery with A SIP OF WATER  NP THYROID  omeprazole (PRILOSEC)  rosuvastatin (CRESTOR) HYDROcodone-acetaminophen (NORCO/VICODIN)-as needed for pain Eye drops   As of today, STOP taking any Aspirin (unless otherwise instructed by your surgeon) Aleve, Naproxen, Ibuprofen, Motrin, Advil, Goody's, BC's, all herbal medications, fish oil, and all vitamins.   WHAT DO I DO ABOUT MY DIABETES MEDICATION?   Marland Kitchen Do not take metFORMIN (GLUCOPHAGE) the morning of surgery.   . THE NIGHT BEFORE SURGERY, take 27 units of insulin detemir (LEVEMIR FLEXTOUCH) insulin.     . The day of surgery, do not take Trulicity (dulaglutide).  . If your CBG is greater than 220 mg/dL, you may take  of your sliding scale (correction) dose of Insulin Lispro.   HOW TO MANAGE YOUR DIABETES BEFORE AND AFTER SURGERY  Why is it important to control my blood sugar before and after surgery? . Improving blood sugar levels before and after surgery helps healing and can limit problems. . A way of improving blood sugar control is eating a healthy diet by: o  Eating less sugar and carbohydrates o  Increasing activity/exercise o  Talking with your doctor about reaching your blood sugar goals . High blood sugars (greater  than 180 mg/dL) can raise your risk of infections and slow your recovery, so you will need to focus on controlling your diabetes during the weeks before surgery. . Make sure that the doctor who takes care of your diabetes knows about your planned surgery including the date and location.  How do I manage my blood sugar before surgery? . Check your blood sugar at least 4 times a day, starting 2 days before surgery, to make sure that the level is not too high or low. . Check your blood sugar the morning of your surgery when you wake up and every 2 hours until you get to the Short Stay unit. o If your blood sugar is less than 70 mg/dL, you will need to treat for low blood sugar: - Do not take insulin. - Treat a low blood sugar (less than 70 mg/dL) with  cup of clear juice (cranberry or apple), 4 glucose tablets, OR glucose gel. - Recheck blood sugar in 15 minutes after treatment (to make sure it is greater than 70 mg/dL). If your blood sugar is not greater than 70 mg/dL on recheck, call [phone] for further instructions. . Report your blood sugar to the short stay nurse when you get to Short Stay.  . If you are admitted to the hospital after surgery: o Your blood sugar will be checked by the staff and you will probably be given insulin after surgery (instead of oral diabetes medicines) to make sure you have good blood sugar levels. o The goal for blood sugar  control after surgery is 80-180 mg/dL.                      Do NOT Smoke (Tobacco/Vaping) or drink Alcohol 24 hours prior to your procedure.  If you use a CPAP at night, you may bring all equipment for your overnight stay.   Contacts, glasses, piercing's, hearing aid's, dentures or partials may not be worn into surgery, please bring cases for these belongings.    For patients admitted to the hospital, discharge time will be determined by your treatment team.   Patients discharged the day of surgery will not be allowed to drive home,  and someone needs to stay with them for 24 hours.    Special instructions:   [place]- Preparing For Surgery  Before surgery, you can play an important role. Because skin is not sterile, your skin needs to be as free of germs as possible. You can reduce the number of germs on your skin by washing with CHG (chlorahexidine gluconate) Soap before surgery.  CHG is an antiseptic cleaner which kills germs and bonds with the skin to continue killing germs even after washing.    Oral Hygiene is also important to reduce your risk of infection.  Remember - BRUSH YOUR TEETH THE MORNING OF SURGERY WITH YOUR REGULAR TOOTHPASTE  Please do not use if you have an allergy to CHG or antibacterial soaps. If your skin becomes reddened/irritated stop using the CHG.  Do not shave (including legs and underarms) for at least 48 hours prior to first CHG shower. It is OK to shave your face.  Please follow these instructions carefully.   1. Shower the NIGHT BEFORE SURGERY and the MORNING OF SURGERY  2. If you chose to wash your hair, wash your hair first as usual with your normal shampoo.  3. After you shampoo, rinse your hair and body thoroughly to remove the shampoo.  4. Use CHG Soap as you would any other liquid soap. You can apply CHG directly to the skin and wash gently with a scrungie or a clean washcloth.   5. Apply the CHG Soap to your body ONLY FROM THE NECK DOWN.  Do not use on open wounds or open sores. Avoid contact with your eyes, ears, mouth and genitals (private parts). Wash Face and genitals (private parts)  with your normal soap.   6. Wash thoroughly, paying special attention to the area where your surgery will be performed.  7. Thoroughly rinse your body with warm water from the neck down.  8. DO NOT shower/wash with your normal soap after using and rinsing off the CHG Soap.  9. Pat yourself dry with a CLEAN TOWEL.  10. Wear CLEAN PAJAMAS to bed the night before surgery  11. Place CLEAN  SHEETS on your bed the night before your surgery  12. DO NOT SLEEP WITH PETS.   Day of Surgery: Shower with CHG soap. Do not wear jewelry, make up, or nail polish Do not wear lotions, powders, perfumes, or deodorant. Do not shave 48 hours prior to surgery.   Do not bring valuables to the hospital. [facility] is not responsible for any belongings or valuables. Wear Clean/Comfortable clothing the morning of surgery Remember to brush your teeth WITH YOUR REGULAR TOOTHPASTE.   Please read over the following fact sheets that you were given.

## 2020-10-14 ENCOUNTER — Encounter (HOSPITAL_COMMUNITY)
Admission: RE | Admit: 2020-10-14 | Discharge: 2020-10-14 | Disposition: A | Discharge status: Home / Self Care | Payer: Medicare Other | Source: Ambulatory Visit | Attending: Orthopedic Surgery | Admitting: Orthopedic Surgery

## 2020-10-14 ENCOUNTER — Encounter (HOSPITAL_COMMUNITY): Payer: Self-pay

## 2020-10-14 ENCOUNTER — Other Ambulatory Visit: Payer: Self-pay

## 2020-10-14 ENCOUNTER — Ambulatory Visit (HOSPITAL_COMMUNITY)
Admission: RE | Admit: 2020-10-14 | Discharge: 2020-10-14 | Disposition: A | Discharge status: Home / Self Care | Payer: Medicare Other | Source: Ambulatory Visit | Attending: Orthopedic Surgery | Admitting: Orthopedic Surgery

## 2020-10-14 DIAGNOSIS — E669 Obesity, unspecified: Secondary | ICD-10-CM | POA: Insufficient documentation

## 2020-10-14 DIAGNOSIS — Z96651 Presence of right artificial knee joint: Secondary | ICD-10-CM | POA: Insufficient documentation

## 2020-10-14 DIAGNOSIS — Z9989 Dependence on other enabling machines and devices: Secondary | ICD-10-CM | POA: Insufficient documentation

## 2020-10-14 DIAGNOSIS — I700 Atherosclerosis of aorta: Secondary | ICD-10-CM | POA: Insufficient documentation

## 2020-10-14 DIAGNOSIS — M4316 Spondylolisthesis, lumbar region: Secondary | ICD-10-CM | POA: Insufficient documentation

## 2020-10-14 DIAGNOSIS — G4733 Obstructive sleep apnea (adult) (pediatric): Secondary | ICD-10-CM | POA: Insufficient documentation

## 2020-10-14 DIAGNOSIS — Z794 Long term (current) use of insulin: Secondary | ICD-10-CM | POA: Insufficient documentation

## 2020-10-14 DIAGNOSIS — Z20822 Contact with and (suspected) exposure to covid-19: Secondary | ICD-10-CM | POA: Insufficient documentation

## 2020-10-14 DIAGNOSIS — Z79899 Other long term (current) drug therapy: Secondary | ICD-10-CM | POA: Insufficient documentation

## 2020-10-14 DIAGNOSIS — Z7984 Long term (current) use of oral hypoglycemic drugs: Secondary | ICD-10-CM | POA: Insufficient documentation

## 2020-10-14 DIAGNOSIS — F32A Depression, unspecified: Secondary | ICD-10-CM | POA: Insufficient documentation

## 2020-10-14 DIAGNOSIS — Z981 Arthrodesis status: Secondary | ICD-10-CM | POA: Insufficient documentation

## 2020-10-14 DIAGNOSIS — Z6835 Body mass index (BMI) 35.0-35.9, adult: Secondary | ICD-10-CM | POA: Insufficient documentation

## 2020-10-14 DIAGNOSIS — Z01818 Encounter for other preprocedural examination: Secondary | ICD-10-CM

## 2020-10-14 DIAGNOSIS — K219 Gastro-esophageal reflux disease without esophagitis: Secondary | ICD-10-CM | POA: Insufficient documentation

## 2020-10-14 DIAGNOSIS — M961 Postlaminectomy syndrome, not elsewhere classified: Secondary | ICD-10-CM | POA: Insufficient documentation

## 2020-10-14 DIAGNOSIS — E119 Type 2 diabetes mellitus without complications: Secondary | ICD-10-CM | POA: Insufficient documentation

## 2020-10-14 DIAGNOSIS — M48061 Spinal stenosis, lumbar region without neurogenic claudication: Secondary | ICD-10-CM | POA: Insufficient documentation

## 2020-10-14 DIAGNOSIS — Z79890 Hormone replacement therapy (postmenopausal): Secondary | ICD-10-CM | POA: Insufficient documentation

## 2020-10-14 HISTORY — DX: Malignant (primary) neoplasm, unspecified: C80.1

## 2020-10-14 HISTORY — DX: Anxiety disorder, unspecified: F41.9

## 2020-10-14 LAB — COMPREHENSIVE METABOLIC PANEL
ALT: 42 U/L (ref 0–44)
AST: 49 U/L — ABNORMAL HIGH (ref 15–41)
Albumin: 4.1 g/dL (ref 3.5–5.0)
Alkaline Phosphatase: 123 U/L (ref 38–126)
Anion gap: 11 (ref 5–15)
BUN: 7 mg/dL — ABNORMAL LOW (ref 8–23)
CO2: 28 mmol/L (ref 22–32)
Calcium: 9.7 mg/dL (ref 8.9–10.3)
Chloride: 100 mmol/L (ref 98–111)
Creatinine, Ser: 0.68 mg/dL (ref 0.44–1.00)
GFR, Estimated: >60 mL/min (ref >60)
Glucose, Bld: 208 mg/dL — ABNORMAL HIGH (ref 70–99)
Potassium: 3.8 mmol/L (ref 3.5–5.1)
Sodium: 139 mmol/L (ref 135–145)
Total Bilirubin: 1.1 mg/dL (ref 0.3–1.2)
Total Protein: 7.5 g/dL (ref 6.5–8.1)

## 2020-10-14 LAB — URINALYSIS, ROUTINE W REFLEX MICROSCOPIC
Glucose, UA: NEGATIVE mg/dL
Ketones, ur: NEGATIVE mg/dL
Nitrite: NEGATIVE
Protein, ur: 100 mg/dL — AB
Specific Gravity, Urine: 1.027 (ref 1.005–1.030)
WBC, UA: >50 WBC/hpf — ABNORMAL HIGH (ref 0–5)
pH: 5 (ref 5.0–8.0)

## 2020-10-14 LAB — TYPE AND SCREEN
ABO/RH(D): O POS
Antibody Screen: NEGATIVE

## 2020-10-14 LAB — SURGICAL PCR SCREEN
MRSA, PCR: NEGATIVE
Staphylococcus aureus: NEGATIVE

## 2020-10-14 LAB — CBC
HCT: 42.8 % (ref 36.0–46.0)
Hemoglobin: 13.6 g/dL (ref 12.0–15.0)
MCH: 29.3 pg (ref 26.0–34.0)
MCHC: 31.8 g/dL (ref 30.0–36.0)
MCV: 92.2 fL (ref 80.0–100.0)
Platelets: 184 10*3/uL (ref 150–400)
RBC: 4.64 MIL/uL (ref 3.87–5.11)
RDW: 14 % (ref 11.5–15.5)
WBC: 7.5 10*3/uL (ref 4.0–10.5)
nRBC: 0 % (ref 0.0–0.2)

## 2020-10-14 LAB — HEMOGLOBIN A1C
Hgb A1c MFr Bld: 8.5 % — ABNORMAL HIGH (ref 4.8–5.6)
Mean Plasma Glucose: 197.25 mg/dL

## 2020-10-14 LAB — SARS CORONAVIRUS 2 (TAT 6-24 HRS): SARS Coronavirus 2: NEGATIVE

## 2020-10-14 LAB — PROTIME-INR
INR: 1.1 (ref 0.8–1.2)
Prothrombin Time: 13.7 seconds (ref 11.4–15.2)

## 2020-10-14 LAB — APTT: aPTT: 30 seconds (ref 24–36)

## 2020-10-14 LAB — GLUCOSE, CAPILLARY: Glucose-Capillary: 220 mg/dL — ABNORMAL HIGH (ref 70–99)

## 2020-10-14 NOTE — Anesthesia Preprocedure Evaluation (Addendum)
Anesthesia Evaluation  Patient identified by MRN, date of birth, ID band Patient awake    Reviewed: Allergy & Precautions, NPO status , Patient's Chart, lab work & pertinent test results  Airway Mallampati: II  TM Distance: <3 FB Neck ROM: Full    Dental no notable dental hx. (+) Teeth Intact, Dental Advisory Given, Caps   Pulmonary sleep apnea and Continuous Positive Airway Pressure Ventilation , former smoker,    Pulmonary exam normal breath sounds clear to auscultation       Cardiovascular Exercise Tolerance: Good Normal cardiovascular exam Rhythm:Regular Rate:Normal  Normal sinus rhythm Minimal voltage criteria for LVH, may be normal variant ( R in aVL ) Cannot rule out Anterior infarct , age undetermined Abnormal ECG   Neuro/Psych PSYCHIATRIC DISORDERS Anxiety Depression negative neurological ROS     GI/Hepatic Neg liver ROS, GERD  ,  Endo/Other  diabetes, Poorly Controlled, Type 2, Oral Hypoglycemic Agents, Insulin DependentHypothyroidism   Renal/GU negative Renal ROS  negative genitourinary   Musculoskeletal  (+) Arthritis , Osteoarthritis,    Abdominal   Peds negative pediatric ROS (+)  Hematology negative hematology ROS (+)   Anesthesia Other Findings   Reproductive/Obstetrics negative OB ROS                          Anesthesia Physical Anesthesia Plan  ASA: III  Anesthesia Plan: General   Post-op Pain Management:    Induction:   PONV Risk Score and Plan: 3 and Midazolam, Treatment may vary due to age or medical condition, Dexamethasone and Ondansetron  Airway Management Planned: Oral ETT  Additional Equipment:   Intra-op Plan:   Post-operative Plan: Extubation in OR  Informed Consent: I have reviewed the patients History and Physical, chart, labs and discussed the procedure including the risks, benefits and alternatives for the proposed anesthesia with the patient or  authorized representative who has indicated his/her understanding and acceptance.     Dental advisory given  Plan Discussed with: Anesthesiologist and CRNA  Anesthesia Plan Comments:       Anesthesia Quick Evaluation

## 2020-10-14 NOTE — Progress Notes (Addendum)
PCP - Hurshel Party Cardiologist - denies Endocrinologist: Benjiman Core  PPM/ICD - denies   Chest x-ray - 10/14/20 EKG - 10/14/20 Stress Test - records requested- pt can not remember when ECHO - maybe? Records requested Cardiac Cath - denies  Sleep Study - yes CPAP - wears nightly, does not know settings (managed by Dr. Loma Newton)   Fasting Blood Sugar - 180-200 Checks Blood Sugar 4-5 times a day  Patient instructed to hold all Aspirin, NSAID's, herbal medications, fish oil and vitamins 7 days prior to surgery.   ERAS Protcol -no   COVID TEST- 10/14/20   Anesthesia review: yes, per order. A1C 8.5  Patient denies shortness of breath, fever, cough and chest pain at PAT appointment   All instructions explained to the patient, with a verbal understanding of the material. Patient agrees to go over the instructions while at home for a better understanding. Patient also instructed to self quarantine after being tested for COVID-19. The opportunity to ask questions was provided.

## 2020-10-14 NOTE — Pre-Procedure Instructions (Signed)
Surgical Instructions    Your procedure is scheduled on Wednesday, May 4th.  Report to [facility] Main Entrance "A" at 6:30 A.M., then check in with the Admitting office.  Call this number if you have problems the morning of surgery:  [phone]   If you have any questions prior to your surgery date call [phone]: Open Monday-Friday 8am-4pm    Remember:  Do not eat or drink after midnight the night before your surgery     Take these medicines the morning of surgery with A SIP OF WATER  NP THYROID  omeprazole (PRILOSEC)  rosuvastatin (CRESTOR) HYDROcodone-acetaminophen (NORCO/VICODIN)-as needed for pain Eye drops   As of today, STOP taking any Aspirin (unless otherwise instructed by your surgeon) Aleve, Naproxen, Ibuprofen, Motrin, Advil, Goody's, BC's, all herbal medications, fish oil, and all vitamins.   WHAT DO I DO ABOUT MY DIABETES MEDICATION?    . THE NIGHT BEFORE SURGERY, take 27 units of insulin detemir (LEVEMIR FLEXTOUCH) insulin.     . The day of surgery, do not take Trulicity (dulaglutide) or metFORMIN (GLUCOPHAGE).  . If your CBG is greater than 220 mg/dL, you may take  of your sliding scale (correction) dose of Insulin Lispro the day of surgery.    HOW TO MANAGE YOUR DIABETES BEFORE AND AFTER SURGERY  Why is it important to control my blood sugar before and after surgery? . Improving blood sugar levels before and after surgery helps healing and can limit problems. . A way of improving blood sugar control is eating a healthy diet by: o  Eating less sugar and carbohydrates o  Increasing activity/exercise o  Talking with your doctor about reaching your blood sugar goals . High blood sugars (greater than 180 mg/dL) can raise your risk of infections and slow your recovery, so you will need to focus on controlling your diabetes during the weeks before surgery. . Make sure that the doctor who takes care of your diabetes knows about your planned surgery  including the date and location.  How do I manage my blood sugar before surgery? . Check your blood sugar at least 4 times a day, starting 2 days before surgery, to make sure that the level is not too high or low. . Check your blood sugar the morning of your surgery when you wake up and every 2 hours until you get to the Short Stay unit. o If your blood sugar is less than 70 mg/dL, you will need to treat for low blood sugar: - Do not take insulin. - Treat a low blood sugar (less than 70 mg/dL) with  cup of clear juice (cranberry or apple), 4 glucose tablets, OR glucose gel. - Recheck blood sugar in 15 minutes after treatment (to make sure it is greater than 70 mg/dL). If your blood sugar is not greater than 70 mg/dL on recheck, call [phone] for further instructions. . Report your blood sugar to the short stay nurse when you get to Short Stay.  . If you are admitted to the hospital after surgery: o Your blood sugar will be checked by the staff and you will probably be given insulin after surgery (instead of oral diabetes medicines) to make sure you have good blood sugar levels. o The goal for blood sugar control after surgery is 80-180 mg/dL.                      Do NOT Smoke (Tobacco/Vaping) or drink Alcohol 24 hours prior to your procedure.  If you use a CPAP at night, you may bring all equipment for your overnight stay.   Contacts, glasses, piercing's, hearing aid's, dentures or partials may not be worn into surgery, please bring cases for these belongings.    For patients admitted to the hospital, discharge time will be determined by your treatment team.   Patients discharged the day of surgery will not be allowed to drive home, and someone needs to stay with them for 24 hours.    Special instructions:   Agenda- Preparing For Surgery  Before surgery, you can play an important role. Because skin is not sterile, your skin needs to be as free of germs as possible. You can  reduce the number of germs on your skin by washing with CHG (chlorahexidine gluconate) Soap before surgery.  CHG is an antiseptic cleaner which kills germs and bonds with the skin to continue killing germs even after washing.    Oral Hygiene is also important to reduce your risk of infection.  Remember - BRUSH YOUR TEETH THE MORNING OF SURGERY WITH YOUR REGULAR TOOTHPASTE  Please do not use if you have an allergy to CHG or antibacterial soaps. If your skin becomes reddened/irritated stop using the CHG.  Do not shave (including legs and underarms) for at least 48 hours prior to first CHG shower. It is OK to shave your face.  Please follow these instructions carefully.   1. Shower the NIGHT BEFORE SURGERY and the MORNING OF SURGERY  2. If you chose to wash your hair, wash your hair first as usual with your normal shampoo.  3. After you shampoo, rinse your hair and body thoroughly to remove the shampoo.  4. Use CHG Soap as you would any other liquid soap. You can apply CHG directly to the skin and wash gently with a scrungie or a clean washcloth.   5. Apply the CHG Soap to your body ONLY FROM THE NECK DOWN.  Do not use on open wounds or open sores. Avoid contact with your eyes, ears, mouth and genitals (private parts). Wash Face and genitals (private parts)  with your normal soap.   6. Wash thoroughly, paying special attention to the area where your surgery will be performed.  7. Thoroughly rinse your body with warm water from the neck down.  8. DO NOT shower/wash with your normal soap after using and rinsing off the CHG Soap.  9. Pat yourself dry with a CLEAN TOWEL.  10. Wear CLEAN PAJAMAS to bed the night before surgery  11. Place CLEAN SHEETS on your bed the night before your surgery  12. DO NOT SLEEP WITH PETS.   Day of Surgery: Shower with CHG soap. Do not wear jewelry, make up, or nail polish Do not wear lotions, powders, perfumes, or deodorant. Do not shave 48 hours prior to  surgery.   Do not bring valuables to the hospital. [facility] is not responsible for any belongings or valuables. Wear Clean/Comfortable clothing the morning of surgery Remember to brush your teeth WITH YOUR REGULAR TOOTHPASTE.   Please read over the following fact sheets that you were given.

## 2020-10-14 NOTE — Progress Notes (Addendum)
Anesthesia Follow-up:   Case: [id number] Date/Time: 10/15/20 0815   Procedure: ANTERIOR LATERAL LUMBAR FUSION 2 LEVELS (XLIF) L2-4 (N/A ) - 4 hrs   Anesthesia type: General   Pre-op diagnosis: Post laminectomy syndrome with recurrent stenosis and spondylolisthesis   Location: [facility] OR ROOM 04 / [facility]   Surgeons: Melina Schools, MD      She is also scheduled for revision decompression L2-4 on 10/16/20.    DISCUSSION: Patient is a 65 year old female scheduled for the above procedures. Surgery had been planned in April 2020 but delayed until DM better controlled. It was rescheduled for 09/12/19 after A1c down to 7.0% but for unknown reasons, surgery did not happened. Now surgery rescheduled for 10/15/20.  A1c resulted at 8.5% (previously 8.6% 09/23/20 and 8.4% 08/08/20). However, given clearance by endocrinology given projected improvement in her A1c following changes with already improving CBG trends (see below).   History includes former smoker (quit 06/14/78), DM2, HLD, anemia, hypothyroidism, OSA (uses CPAP), GERD, depression, RLS, skin cancer ([facility]), back surgery (L4-5 lumbar fusion 2012; 2016), right TKA (01/31/17). BMI is consistent with obesity.   Last evaluation by endocrinologist Dr. Cruzita Lederer on 09/23/20 and wrote,  "At today's visit, we reviewed her CGM downloads: It appears that 86% of values are in target range (goal >70%), while 13% are higher than 180 (goal <25%), and 1% are lower than 70 (goal <4%).  The calculated average blood sugar is 134.  The projected HbA1c for the next 3 months (GMI) is 6.5%. -Reviewing the CGM trends, there is significant improvement in her blood sugars in the last 2 weeks.  The sugars are at goal overnight and they become more variable after lunch, with either normal blood sugars or hyperglycemia after this meal.  Also, after dinner, she has higher blood sugars, occasionally above our target of less than 180.  I advised her to use 10 units of insulin before these meals  especially if she has a larger meal or a meal with more carbs.  Otherwise, due to the significant improvement in the blood sugar and the fact that the vast majority of her blood sugars are at goal, I would not recommend any changes in her regimen. -I will clear her for surgery from the diabetes point of view".  Based on H&P, Nelson Chimes, PA-C is aware of DM control and has discussed risks and subsequent changes made to surgical plan. She notes, they have also received medical clearance from her PCP.  UA showed moderate leukocytes, negative nitrites, > 50 WBC, rare bacteria. PAT RN is communicating UA results to surgical team.  10/14/20 presurgical COVID-19 test and CXR are still in process. Anesthesia team to evaluate on the day of surgery.    VS: BP (!) 143/83   Pulse 97   Temp 36.5 C (Oral)   Resp 18   Ht 5' 3.25" (1.607 m)   Wt 91.2 kg   SpO2 100%   BMI 35.32 kg/m    PROVIDERS: [name], MD  is PCP  Philemon Kingdom, MD is endocrinologist   LABS: Preoperative labs noted. See DISCUSSION. (all labs ordered are listed, but only abnormal results are displayed)  Labs Reviewed  GLUCOSE, CAPILLARY - Abnormal; Notable for the following components:      Result Value   Glucose-Capillary 220 (*)    All other components within normal limits  HEMOGLOBIN A1C - Abnormal; Notable for the following components:   Hgb A1c MFr Bld 8.5 (*)    All other  components within normal limits  URINALYSIS, ROUTINE W REFLEX MICROSCOPIC - Abnormal; Notable for the following components:   Color, Urine AMBER (*)    APPearance CLOUDY (*)    Hgb urine dipstick MODERATE (*)    Bilirubin Urine SMALL (*)    Protein, ur 100 (*)    Leukocytes,Ua MODERATE (*)    WBC, UA >50 (*)    Bacteria, UA RARE (*)    All other components within normal limits  COMPREHENSIVE METABOLIC PANEL - Abnormal; Notable for the following components:   Glucose, Bld 208 (*)    BUN 7 (*)    AST 49 (*)    All other  components within normal limits  SURGICAL PCR SCREEN  SARS CORONAVIRUS 2 (TAT 6-24 HRS)  CBC  PROTIME-INR  APTT  TYPE AND SCREEN     IMAGES: CXR 10/14/20: In process.  MRI L-spine 04/26/20: IMPRESSION: 1. Acute or subacute superior endplate fracture at M35 with loss of height of 20%. No retropulsed bone. No traumatic disc herniation. 2. L2-3: Previous posterior decompression. Endplate osteophytes and bulging of the disc. Bilateral facet arthropathy with a large complex synovial cyst projecting inward from the facet on the left, measuring up to 13 mm in size. Stenosis of the left lateral recess and intervertebral foramen on the left that could cause left-sided neural compression. 3. L3-4: Bilateral facet arthropathy with 3 mm of anterolisthesis. Disc protrusion more prominent towards the left. Stenosis of the left lateral recess that could cause neural compression. Bilateral foraminal encroachment, left more than right. Particular potential for compression of the left L3 nerve. 4. L4-5: Previous posterior decompression and fusion. No complicating features.   EKG: 10/14/20: Normal sinus rhythm Minimal voltage criteria for LVH, may be normal variant ( R in aVL ) Cannot rule out Anterior infarct , age undetermined Abnormal ECG   CV: She thinks she may have had a stress test in the past, possibly echo, but is so would have been > 3 years ago--received records indicating she had an exercise nuclear stress test on 03/04/10 with [facility]), although result page not included.    Past Medical History:  Diagnosis Date  . Anemia   . Anxiety   . Arthritis   . Cancer ([place])    basal cell carcinoma right arm and nose spots removed  . Depression   . GERD (gastroesophageal reflux disease)   . HLD (hyperlipidemia) 02/20/2019  . Morbid obesity ([place]) 02/20/2019  . Primary hypothyroidism 02/20/2019  . RLS (restless legs syndrome)   . Shingles   . Sleep apnea   . Type II  diabetes mellitus, uncontrolled ([place]) 02/20/2019  . Vitamin D deficiency disease 02/20/2019    Past Surgical History:  Procedure Laterality Date  . BACK SURGERY  2012, 2016   lumbar fusion 2012, cleaning out of area 2016  . BREAST BIOPSY Left 2010  . CHOLECYSTECTOMY  2011  . CLOSED REDUCTION ANKLE FRACTURE    . CLOSED REDUCTION HUMERUS FRACTURE Right 2014  . COLONOSCOPY WITH PROPOFOL N/A 02/21/2018   Procedure: COLONOSCOPY WITH PROPOFOL;  Surgeon: Danie Binder, MD;  Location: [facility] ENDO SUITE;  Service: Endoscopy;  Laterality: N/A;  1:15pm - pt knows to arrive at 10:30  . EYE SURGERY  2016   eye lid droop   . KNEE ARTHROSCOPY Right 2010  . POLYPECTOMY  02/21/2018   Procedure: POLYPECTOMY;  Surgeon: Danie Binder, MD;  Location: [facility] ENDO SUITE;  Service: Endoscopy;;  hepatic flexure polyp  .  TOTAL KNEE ARTHROPLASTY Right 01/31/2017   Procedure: RIGHT TOTAL KNEE ARTHROPLASTY;  Surgeon: Paralee Cancel, MD;  Location: [facility] ORS;  Service: Orthopedics;  Laterality: Right;  90 mins    MEDICATIONS: . calcium carbonate (OSCAL) 1500 (600 Ca) MG TABS tablet  . Cholecalciferol (VITAMIN D-3) 125 MCG (5000 UT) TABS  . Continuous Blood Gluc Receiver (FREESTYLE LIBRE 2 READER) DEVI  . Continuous Blood Gluc Sensor (FREESTYLE LIBRE 2 SENSOR) MISC  . Dulaglutide (TRULICITY) 3 EN/2.7PO SOPN  . furosemide (LASIX) 40 MG tablet  . HYDROcodone-acetaminophen (NORCO/VICODIN) 5-325 MG tablet  . insulin detemir (LEVEMIR FLEXTOUCH) 100 UNIT/ML FlexPen  . Insulin Lispro-aabc, 1 U Dial, (LYUMJEV KWIKPEN) 100 UNIT/ML SOPN  . Insulin Pen Needle 32G X 4 MM MISC  . Magnesium 500 MG TABS  . melatonin 3 MG TABS tablet  . metFORMIN (GLUCOPHAGE) 1000 MG tablet  . NP THYROID 90 MG tablet  . omeprazole (PRILOSEC) 20 MG capsule  . Polyethyl Glycol-Propyl Glycol (SYSTANE FREE OP)  . rOPINIRole (REQUIP) 3 MG tablet  . rosuvastatin (CRESTOR) 20 MG tablet  . Testosterone Propionate (FIRST-TESTOSTERONE [facility]) 2 % CREA  . vitamin  B-12 (CYANOCOBALAMIN) 1000 MCG tablet   No current facility-administered medications for this encounter.    Myra Gianotti, PA-C Surgical Short Stay/Anesthesiology [facility] Phone [phone] [facility] Phone [phone] 10/14/2020 3:47 PM

## 2020-10-14 NOTE — Progress Notes (Signed)
Dr. Duane Lope brooks notified of leukocytes in urinalysis. Staff messaged Megan Church as well as called the office and left a message with results.

## 2020-10-15 ENCOUNTER — Inpatient Hospital Stay (HOSPITAL_COMMUNITY)
Admission: RE | Admit: 2020-10-15 | Discharge: 2020-10-19 | DRG: 453 | Disposition: A | Discharge status: Home / Self Care | Payer: Medicare Other | Attending: Orthopedic Surgery | Admitting: Orthopedic Surgery

## 2020-10-15 ENCOUNTER — Encounter (HOSPITAL_COMMUNITY): Payer: Self-pay | Admitting: Orthopedic Surgery

## 2020-10-15 ENCOUNTER — Inpatient Hospital Stay (HOSPITAL_COMMUNITY)
Admission: RE | Disposition: A | Discharge status: Home / Self Care | Payer: Self-pay | Source: Home / Self Care | Attending: Orthopedic Surgery

## 2020-10-15 ENCOUNTER — Inpatient Hospital Stay (HOSPITAL_COMMUNITY): Payer: Medicare Other | Admitting: Anesthesiology

## 2020-10-15 ENCOUNTER — Inpatient Hospital Stay (HOSPITAL_COMMUNITY): Payer: Medicare Other | Admitting: Physician Assistant

## 2020-10-15 ENCOUNTER — Inpatient Hospital Stay (HOSPITAL_COMMUNITY): Payer: Medicare Other

## 2020-10-15 DIAGNOSIS — Z79899 Other long term (current) drug therapy: Secondary | ICD-10-CM

## 2020-10-15 DIAGNOSIS — M7138 Other bursal cyst, other site: Secondary | ICD-10-CM | POA: Diagnosis not present

## 2020-10-15 DIAGNOSIS — Z823 Family history of stroke: Secondary | ICD-10-CM

## 2020-10-15 DIAGNOSIS — R000 Tachycardia, unspecified: Secondary | ICD-10-CM | POA: Diagnosis not present

## 2020-10-15 DIAGNOSIS — E785 Hyperlipidemia, unspecified: Secondary | ICD-10-CM | POA: Diagnosis present

## 2020-10-15 DIAGNOSIS — Z7984 Long term (current) use of oral hypoglycemic drugs: Secondary | ICD-10-CM

## 2020-10-15 DIAGNOSIS — Z6835 Body mass index (BMI) 35.0-35.9, adult: Secondary | ICD-10-CM | POA: Diagnosis not present

## 2020-10-15 DIAGNOSIS — K219 Gastro-esophageal reflux disease without esophagitis: Secondary | ICD-10-CM | POA: Diagnosis present

## 2020-10-15 DIAGNOSIS — Z20822 Contact with and (suspected) exposure to covid-19: Secondary | ICD-10-CM | POA: Diagnosis present

## 2020-10-15 DIAGNOSIS — J690 Pneumonitis due to inhalation of food and vomit: Secondary | ICD-10-CM | POA: Diagnosis not present

## 2020-10-15 DIAGNOSIS — Z794 Long term (current) use of insulin: Secondary | ICD-10-CM

## 2020-10-15 DIAGNOSIS — M5116 Intervertebral disc disorders with radiculopathy, lumbar region: Principal | ICD-10-CM | POA: Diagnosis present

## 2020-10-15 DIAGNOSIS — Z809 Family history of malignant neoplasm, unspecified: Secondary | ICD-10-CM

## 2020-10-15 DIAGNOSIS — E1165 Type 2 diabetes mellitus with hyperglycemia: Secondary | ICD-10-CM | POA: Diagnosis present

## 2020-10-15 DIAGNOSIS — R509 Fever, unspecified: Secondary | ICD-10-CM

## 2020-10-15 DIAGNOSIS — Z9049 Acquired absence of other specified parts of digestive tract: Secondary | ICD-10-CM

## 2020-10-15 DIAGNOSIS — M4316 Spondylolisthesis, lumbar region: Secondary | ICD-10-CM | POA: Diagnosis not present

## 2020-10-15 DIAGNOSIS — E039 Hypothyroidism, unspecified: Secondary | ICD-10-CM | POA: Diagnosis present

## 2020-10-15 DIAGNOSIS — Z888 Allergy status to other drugs, medicaments and biological substances status: Secondary | ICD-10-CM

## 2020-10-15 DIAGNOSIS — E559 Vitamin D deficiency, unspecified: Secondary | ICD-10-CM | POA: Diagnosis present

## 2020-10-15 DIAGNOSIS — Z981 Arthrodesis status: Secondary | ICD-10-CM

## 2020-10-15 DIAGNOSIS — M4854XD Collapsed vertebra, not elsewhere classified, thoracic region, subsequent encounter for fracture with routine healing: Secondary | ICD-10-CM | POA: Diagnosis present

## 2020-10-15 DIAGNOSIS — Z96651 Presence of right artificial knee joint: Secondary | ICD-10-CM | POA: Diagnosis present

## 2020-10-15 DIAGNOSIS — A419 Sepsis, unspecified organism: Secondary | ICD-10-CM | POA: Diagnosis not present

## 2020-10-15 DIAGNOSIS — Y838 Other surgical procedures as the cause of abnormal reaction of the patient, or of later complication, without mention of misadventure at the time of the procedure: Secondary | ICD-10-CM | POA: Diagnosis present

## 2020-10-15 DIAGNOSIS — Z8249 Family history of ischemic heart disease and other diseases of the circulatory system: Secondary | ICD-10-CM

## 2020-10-15 DIAGNOSIS — M2578 Osteophyte, vertebrae: Secondary | ICD-10-CM | POA: Diagnosis present

## 2020-10-15 DIAGNOSIS — J9601 Acute respiratory failure with hypoxia: Secondary | ICD-10-CM | POA: Diagnosis not present

## 2020-10-15 DIAGNOSIS — E1142 Type 2 diabetes mellitus with diabetic polyneuropathy: Secondary | ICD-10-CM | POA: Diagnosis present

## 2020-10-15 DIAGNOSIS — G2581 Restless legs syndrome: Secondary | ICD-10-CM | POA: Diagnosis present

## 2020-10-15 DIAGNOSIS — Z87891 Personal history of nicotine dependence: Secondary | ICD-10-CM

## 2020-10-15 DIAGNOSIS — M4726 Other spondylosis with radiculopathy, lumbar region: Secondary | ICD-10-CM | POA: Diagnosis not present

## 2020-10-15 DIAGNOSIS — R7401 Elevation of levels of liver transaminase levels: Secondary | ICD-10-CM | POA: Diagnosis not present

## 2020-10-15 DIAGNOSIS — M48061 Spinal stenosis, lumbar region without neurogenic claudication: Secondary | ICD-10-CM | POA: Diagnosis present

## 2020-10-15 DIAGNOSIS — M961 Postlaminectomy syndrome, not elsewhere classified: Secondary | ICD-10-CM | POA: Diagnosis not present

## 2020-10-15 DIAGNOSIS — IMO0002 Reserved for concepts with insufficient information to code with codable children: Secondary | ICD-10-CM | POA: Diagnosis present

## 2020-10-15 DIAGNOSIS — Z9889 Other specified postprocedural states: Secondary | ICD-10-CM | POA: Diagnosis not present

## 2020-10-15 DIAGNOSIS — Z8719 Personal history of other diseases of the digestive system: Secondary | ICD-10-CM | POA: Diagnosis not present

## 2020-10-15 DIAGNOSIS — G4730 Sleep apnea, unspecified: Secondary | ICD-10-CM | POA: Diagnosis present

## 2020-10-15 DIAGNOSIS — Z85828 Personal history of other malignant neoplasm of skin: Secondary | ICD-10-CM

## 2020-10-15 DIAGNOSIS — E8809 Other disorders of plasma-protein metabolism, not elsewhere classified: Secondary | ICD-10-CM | POA: Diagnosis not present

## 2020-10-15 DIAGNOSIS — Z882 Allergy status to sulfonamides status: Secondary | ICD-10-CM

## 2020-10-15 DIAGNOSIS — Z419 Encounter for procedure for purposes other than remedying health state, unspecified: Secondary | ICD-10-CM

## 2020-10-15 DIAGNOSIS — Z83438 Family history of other disorder of lipoprotein metabolism and other lipidemia: Secondary | ICD-10-CM

## 2020-10-15 DIAGNOSIS — R519 Headache, unspecified: Secondary | ICD-10-CM | POA: Diagnosis not present

## 2020-10-15 HISTORY — PX: ANTERIOR LAT LUMBAR FUSION: SHX1168

## 2020-10-15 LAB — GLUCOSE, CAPILLARY
Glucose-Capillary: 194 mg/dL — ABNORMAL HIGH (ref 70–99)
Glucose-Capillary: 200 mg/dL — ABNORMAL HIGH (ref 70–99)
Glucose-Capillary: 343 mg/dL — ABNORMAL HIGH (ref 70–99)
Glucose-Capillary: 265 mg/dL — ABNORMAL HIGH (ref 70–99)

## 2020-10-15 SURGERY — ANTERIOR LATERAL LUMBAR FUSION 2 LEVELS
Anesthesia: General | Site: Spine Lumbar

## 2020-10-15 MED ORDER — ONDANSETRON HCL 4 MG/2ML IJ SOLN
INTRAMUSCULAR | Status: AC
  Filled 2020-10-15: qty 2

## 2020-10-15 MED ORDER — PROMETHAZINE HCL 25 MG/ML IJ SOLN: 6.2500 mg | INTRAMUSCULAR | Status: DC | PRN

## 2020-10-15 MED ORDER — DROPERIDOL 2.5 MG/ML IJ SOLN: 0.6250 mg | Freq: Once | INTRAMUSCULAR | Status: DC | PRN

## 2020-10-15 MED ORDER — OXYCODONE HCL 5 MG PO TABS
5.0000 mg | ORAL_TABLET | ORAL | Status: DC | PRN
  Administered 2020-10-15 – 2020-10-19 (×6): 5 mg via ORAL
  Filled 2020-10-15 (×4): qty 1

## 2020-10-15 MED ORDER — FENTANYL CITRATE (PF) 250 MCG/5ML IJ SOLN
INTRAMUSCULAR | Status: AC
  Filled 2020-10-15: qty 5

## 2020-10-15 MED ORDER — LIDOCAINE 2% (20 MG/ML) 5 ML SYRINGE
INTRAMUSCULAR | Status: DC | PRN
  Administered 2020-10-15: 60 mg via INTRAVENOUS

## 2020-10-15 MED ORDER — LACTATED RINGERS IV SOLN: INTRAVENOUS | Status: DC

## 2020-10-15 MED ORDER — METHOCARBAMOL 500 MG PO TABS
ORAL_TABLET | ORAL | Status: AC
  Filled 2020-10-15: qty 1

## 2020-10-15 MED ORDER — FUROSEMIDE 40 MG PO TABS: 40.0000 mg | ORAL_TABLET | Freq: Every day | ORAL | Status: DC | PRN

## 2020-10-15 MED ORDER — BUPIVACAINE-EPINEPHRINE (PF) 0.25% -1:200000 IJ SOLN
INTRAMUSCULAR | Status: AC
  Filled 2020-10-15: qty 30

## 2020-10-15 MED ORDER — BUPIVACAINE-EPINEPHRINE 0.25% -1:200000 IJ SOLN
INTRAMUSCULAR | Status: DC | PRN
  Administered 2020-10-15: 30 mL

## 2020-10-15 MED ORDER — DEXAMETHASONE SODIUM PHOSPHATE 10 MG/ML IJ SOLN
INTRAMUSCULAR | Status: DC | PRN
  Administered 2020-10-15: 4 mg via INTRAVENOUS

## 2020-10-15 MED ORDER — PROPOFOL 10 MG/ML IV BOLUS
INTRAVENOUS | Status: AC
  Filled 2020-10-15: qty 20

## 2020-10-15 MED ORDER — PHENYLEPHRINE 40 MCG/ML (10ML) SYRINGE FOR IV PUSH (FOR BLOOD PRESSURE SUPPORT)
PREFILLED_SYRINGE | INTRAVENOUS | Status: DC | PRN
  Administered 2020-10-15 (×3): 80 ug via INTRAVENOUS

## 2020-10-15 MED ORDER — MORPHINE SULFATE (PF) 2 MG/ML IV SOLN
2.0000 mg | INTRAVENOUS | Status: DC | PRN
Start: 2020-10-15 — End: 2020-10-19

## 2020-10-15 MED ORDER — OXYCODONE HCL 5 MG/5ML PO SOLN: 5.0000 mg | Freq: Once | ORAL | Status: AC | PRN

## 2020-10-15 MED ORDER — DULAGLUTIDE 3 MG/0.5ML ~~LOC~~ SOAJ: 3.0000 mg | SUBCUTANEOUS | Status: DC

## 2020-10-15 MED ORDER — CEFAZOLIN SODIUM-DEXTROSE 2-4 GM/100ML-% IV SOLN
2.0000 g | INTRAVENOUS | Status: AC
  Administered 2020-10-15: 2 g via INTRAVENOUS
  Filled 2020-10-15: qty 100

## 2020-10-15 MED ORDER — ONDANSETRON HCL 4 MG/2ML IJ SOLN: 4.0000 mg | Freq: Four times a day (QID) | INTRAMUSCULAR | Status: DC | PRN

## 2020-10-15 MED ORDER — METHOCARBAMOL 1000 MG/10ML IJ SOLN
500.0000 mg | Freq: Four times a day (QID) | INTRAMUSCULAR | Status: DC | PRN
  Filled 2020-10-15: qty 5

## 2020-10-15 MED ORDER — LIDOCAINE 2% (20 MG/ML) 5 ML SYRINGE
INTRAMUSCULAR | Status: AC
  Filled 2020-10-15: qty 5

## 2020-10-15 MED ORDER — PHENYLEPHRINE HCL-NACL 10-0.9 MG/250ML-% IV SOLN
INTRAVENOUS | Status: DC | PRN
  Administered 2020-10-15: 30 ug/min via INTRAVENOUS

## 2020-10-15 MED ORDER — DEXAMETHASONE SODIUM PHOSPHATE 10 MG/ML IJ SOLN
INTRAMUSCULAR | Status: AC
  Filled 2020-10-15: qty 1

## 2020-10-15 MED ORDER — KETAMINE HCL 50 MG/5ML IJ SOSY
PREFILLED_SYRINGE | INTRAMUSCULAR | Status: AC
  Filled 2020-10-15: qty 5

## 2020-10-15 MED ORDER — LACTATED RINGERS IV SOLN: INTRAVENOUS | Status: DC | PRN

## 2020-10-15 MED ORDER — MIDAZOLAM HCL 2 MG/2ML IJ SOLN
INTRAMUSCULAR | Status: AC
  Filled 2020-10-15: qty 2

## 2020-10-15 MED ORDER — SUCCINYLCHOLINE CHLORIDE 200 MG/10ML IV SOSY
PREFILLED_SYRINGE | INTRAVENOUS | Status: DC | PRN
  Administered 2020-10-15: 120 mg via INTRAVENOUS

## 2020-10-15 MED ORDER — PROPOFOL 500 MG/50ML IV EMUL
INTRAVENOUS | Status: DC | PRN
  Administered 2020-10-15: 50 ug/kg/min via INTRAVENOUS

## 2020-10-15 MED ORDER — POLYETHYLENE GLYCOL 3350 17 G PO PACK
17.0000 g | PACK | Freq: Every day | ORAL | Status: DC | PRN
  Administered 2020-10-18: 17 g via ORAL
  Filled 2020-10-15: qty 1

## 2020-10-15 MED ORDER — INSULIN ASPART 100 UNIT/ML IJ SOLN
0.0000 [IU] | Freq: Every day | INTRAMUSCULAR | Status: DC
  Administered 2020-10-15 – 2020-10-16 (×2): 3 [IU] via SUBCUTANEOUS
  Administered 2020-10-17 – 2020-10-18 (×2): 2 [IU] via SUBCUTANEOUS

## 2020-10-15 MED ORDER — OXYCODONE HCL 5 MG PO TABS
ORAL_TABLET | ORAL | Status: AC
  Filled 2020-10-15: qty 1

## 2020-10-15 MED ORDER — ORAL CARE MOUTH RINSE: 15.0000 mL | Freq: Once | OROMUCOSAL | Status: AC

## 2020-10-15 MED ORDER — FENTANYL CITRATE (PF) 100 MCG/2ML IJ SOLN
INTRAMUSCULAR | Status: AC
  Filled 2020-10-15: qty 2

## 2020-10-15 MED ORDER — THROMBIN 20000 UNITS EX KIT
PACK | CUTANEOUS | Status: AC
  Filled 2020-10-15: qty 1

## 2020-10-15 MED ORDER — ACETAMINOPHEN 500 MG PO TABS
1000.0000 mg | ORAL_TABLET | Freq: Once | ORAL | Status: AC
  Administered 2020-10-15: 1000 mg via ORAL
  Filled 2020-10-15: qty 2

## 2020-10-15 MED ORDER — PROPOFOL 10 MG/ML IV BOLUS
INTRAVENOUS | Status: DC | PRN
  Administered 2020-10-15: 50 mg via INTRAVENOUS
  Administered 2020-10-15: 150 mg via INTRAVENOUS
  Administered 2020-10-15: 50 mg via INTRAVENOUS
  Administered 2020-10-15: 40 mg via INTRAVENOUS

## 2020-10-15 MED ORDER — INSULIN ASPART 100 UNIT/ML IJ SOLN
0.0000 [IU] | Freq: Three times a day (TID) | INTRAMUSCULAR | Status: DC
  Administered 2020-10-15: 11 [IU] via SUBCUTANEOUS
  Administered 2020-10-16: 3 [IU] via SUBCUTANEOUS
  Administered 2020-10-17 – 2020-10-18 (×4): 5 [IU] via SUBCUTANEOUS
  Administered 2020-10-18 – 2020-10-19 (×3): 3 [IU] via SUBCUTANEOUS
  Administered 2020-10-19: 5 [IU] via SUBCUTANEOUS

## 2020-10-15 MED ORDER — ACETAMINOPHEN 325 MG PO TABS
650.0000 mg | ORAL_TABLET | ORAL | Status: DC | PRN
  Administered 2020-10-15 – 2020-10-19 (×10): 650 mg via ORAL
  Filled 2020-10-15 (×10): qty 2

## 2020-10-15 MED ORDER — ONDANSETRON HCL 4 MG PO TABS: 4.0000 mg | ORAL_TABLET | Freq: Four times a day (QID) | ORAL | Status: DC | PRN

## 2020-10-15 MED ORDER — OXYCODONE HCL 5 MG PO TABS
10.0000 mg | ORAL_TABLET | ORAL | Status: DC | PRN
  Administered 2020-10-15 – 2020-10-17 (×6): 10 mg via ORAL
  Administered 2020-10-17: 5 mg via ORAL
  Administered 2020-10-17 – 2020-10-18 (×3): 10 mg via ORAL
  Filled 2020-10-15 (×12): qty 2

## 2020-10-15 MED ORDER — CHLORHEXIDINE GLUCONATE 0.12 % MT SOLN
15.0000 mL | Freq: Once | OROMUCOSAL | Status: AC
  Administered 2020-10-15: 15 mL via OROMUCOSAL
  Filled 2020-10-15: qty 15

## 2020-10-15 MED ORDER — METHOCARBAMOL 500 MG PO TABS
500.0000 mg | ORAL_TABLET | Freq: Four times a day (QID) | ORAL | Status: DC | PRN
  Administered 2020-10-15 – 2020-10-18 (×8): 500 mg via ORAL
  Filled 2020-10-15 (×7): qty 1

## 2020-10-15 MED ORDER — MIDAZOLAM HCL 5 MG/5ML IJ SOLN
INTRAMUSCULAR | Status: DC | PRN
  Administered 2020-10-15: 2 mg via INTRAVENOUS

## 2020-10-15 MED ORDER — SODIUM CHLORIDE 0.9% FLUSH
3.0000 mL | Freq: Two times a day (BID) | INTRAVENOUS | Status: DC
  Administered 2020-10-18 (×2): 3 mL via INTRAVENOUS

## 2020-10-15 MED ORDER — FENTANYL CITRATE (PF) 100 MCG/2ML IJ SOLN
INTRAMUSCULAR | Status: DC | PRN
  Administered 2020-10-15: 100 ug via INTRAVENOUS
  Administered 2020-10-15 (×2): 50 ug via INTRAVENOUS
  Administered 2020-10-15: 100 ug via INTRAVENOUS

## 2020-10-15 MED ORDER — PHENOL 1.4 % MT LIQD: 1.0000 | OROMUCOSAL | Status: DC | PRN

## 2020-10-15 MED ORDER — THROMBIN 20000 UNITS EX SOLR
CUTANEOUS | Status: DC | PRN
  Administered 2020-10-15: 20 mL via TOPICAL

## 2020-10-15 MED ORDER — ACETAMINOPHEN 650 MG RE SUPP: 650.0000 mg | RECTAL | Status: DC | PRN

## 2020-10-15 MED ORDER — ROPINIROLE HCL 1 MG PO TABS
3.0000 mg | ORAL_TABLET | Freq: Every day | ORAL | Status: DC
  Administered 2020-10-15 – 2020-10-18 (×4): 3 mg via ORAL
  Filled 2020-10-15 (×4): qty 3

## 2020-10-15 MED ORDER — ONDANSETRON HCL 4 MG/2ML IJ SOLN
INTRAMUSCULAR | Status: DC | PRN
  Administered 2020-10-15: 4 mg via INTRAVENOUS

## 2020-10-15 MED ORDER — OXYCODONE HCL 5 MG PO TABS
5.0000 mg | ORAL_TABLET | Freq: Once | ORAL | Status: AC | PRN
  Administered 2020-10-15: 5 mg via ORAL

## 2020-10-15 MED ORDER — SODIUM CHLORIDE 0.9 % IV SOLN
1.0000 g | Freq: Three times a day (TID) | INTRAVENOUS | Status: AC
  Administered 2020-10-15 – 2020-10-16 (×2): 1 g via INTRAVENOUS
  Filled 2020-10-15 (×2): qty 10

## 2020-10-15 MED ORDER — KETAMINE HCL 10 MG/ML IJ SOLN
INTRAMUSCULAR | Status: DC | PRN
  Administered 2020-10-15: 20 mg via INTRAVENOUS
  Administered 2020-10-15: 10 mg via INTRAVENOUS

## 2020-10-15 MED ORDER — MENTHOL 3 MG MT LOZG: 1.0000 | LOZENGE | OROMUCOSAL | Status: DC | PRN

## 2020-10-15 MED ORDER — SUCCINYLCHOLINE CHLORIDE 200 MG/10ML IV SOSY
PREFILLED_SYRINGE | INTRAVENOUS | Status: AC
  Filled 2020-10-15: qty 20

## 2020-10-15 MED ORDER — FENTANYL CITRATE (PF) 100 MCG/2ML IJ SOLN
25.0000 ug | INTRAMUSCULAR | Status: DC | PRN
  Administered 2020-10-15: 50 ug via INTRAVENOUS
  Administered 2020-10-15: 25 ug via INTRAVENOUS
  Administered 2020-10-15: 50 ug via INTRAVENOUS
  Administered 2020-10-15: 25 ug via INTRAVENOUS

## 2020-10-15 MED ORDER — SODIUM CHLORIDE 0.9% FLUSH: 3.0000 mL | INTRAVENOUS | Status: DC | PRN

## 2020-10-15 MED ORDER — THYROID 60 MG PO TABS
90.0000 mg | ORAL_TABLET | Freq: Every day | ORAL | Status: DC
  Administered 2020-10-17 – 2020-10-18 (×2): 90 mg via ORAL
  Filled 2020-10-15 (×4): qty 1

## 2020-10-15 MED ORDER — PHENYLEPHRINE 40 MCG/ML (10ML) SYRINGE FOR IV PUSH (FOR BLOOD PRESSURE SUPPORT)
PREFILLED_SYRINGE | INTRAVENOUS | Status: AC
  Filled 2020-10-15: qty 10

## 2020-10-15 SURGICAL SUPPLY — 62 items
BLADE CLIPPER SURG (BLADE) IMPLANT
BLADE SURG 10 STRL SS (BLADE) ×2 IMPLANT
BONE MATRIX OSTEOCEL PRO LRG (Bone Implant) ×2 IMPLANT
CAGE MODULUS XL 10X18X50 - 10 (Cage) ×2 IMPLANT
CAGE MODULUS XL 12X18X50 - 10 (Cage) ×2 IMPLANT
CATH FOLEY 2WAY SLVR 30CC 16FR (CATHETERS) IMPLANT
CLSR STERI-STRIP ANTIMIC 1/2X4 (GAUZE/BANDAGES/DRESSINGS) ×2 IMPLANT
COVER SURGICAL LIGHT HANDLE (MISCELLANEOUS) ×2 IMPLANT
COVER WAND RF STERILE (DRAPES) ×2 IMPLANT
DRAPE C-ARM 42X72 X-RAY (DRAPES) ×2 IMPLANT
DRAPE C-ARMOR (DRAPES) ×2 IMPLANT
DRAPE INCISE IOBAN 66X45 STRL (DRAPES) ×2 IMPLANT
DRAPE U-SHAPE 47X51 STRL (DRAPES) ×4 IMPLANT
DRSG OPSITE POSTOP 4X6 (GAUZE/BANDAGES/DRESSINGS) ×2 IMPLANT
DURAPREP 26ML APPLICATOR (WOUND CARE) ×2 IMPLANT
ELECT BLADE 4.0 EZ CLEAN MEGAD (MISCELLANEOUS) ×2
ELECT PENCIL ROCKER SW 15FT (MISCELLANEOUS) ×2 IMPLANT
ELECT REM PT RETURN 9FT ADLT (ELECTROSURGICAL) ×2
ELECTRODE BLDE 4.0 EZ CLN MEGD (MISCELLANEOUS) ×1 IMPLANT
ELECTRODE REM PT RTRN 9FT ADLT (ELECTROSURGICAL) ×1 IMPLANT
FEE INTRAOP CADWELL SUPPLY NCS (MISCELLANEOUS) ×1 IMPLANT
GLOVE BIO SURGEON STRL SZ 6.5 (GLOVE) ×2 IMPLANT
GLOVE BIOGEL PI IND STRL 8.5 (GLOVE) ×1 IMPLANT
GLOVE BIOGEL PI INDICATOR 8.5 (GLOVE) ×1
GLOVE SS BIOGEL STRL SZ 8.5 (GLOVE) ×1 IMPLANT
GLOVE SUPERSENSE BIOGEL SZ 8.5 (GLOVE) ×1
GLOVE SURG UNDER POLY LF SZ6.5 (GLOVE) ×2 IMPLANT
GOWN STRL REUS W/ TWL LRG LVL3 (GOWN DISPOSABLE) ×2 IMPLANT
GOWN STRL REUS W/ TWL XL LVL3 (GOWN DISPOSABLE) IMPLANT
GOWN STRL REUS W/TWL 2XL LVL3 (GOWN DISPOSABLE) ×2 IMPLANT
GOWN STRL REUS W/TWL LRG LVL3 (GOWN DISPOSABLE) ×2
GOWN STRL REUS W/TWL XL LVL3 (GOWN DISPOSABLE)
INTRAOP CADWELL SUPPLY FEE NCS (MISCELLANEOUS) ×1
INTRAOP DISP SUPPLY FEE NCS (MISCELLANEOUS) ×1
KIT BASIN OR (CUSTOM PROCEDURE TRAY) ×2 IMPLANT
KIT DILATOR XLIF 5 (KITS) ×1 IMPLANT
KIT SURGICAL ACCESS MAXCESS 4 (KITS) ×2 IMPLANT
KIT TURNOVER KIT B (KITS) ×2 IMPLANT
KIT XLIF (KITS) ×1
MODULE EMG NEEDLE SSEP NVM5 (NEEDLE) ×2 IMPLANT
MODULE NVM5 NEXT GEN EMG (NEEDLE) ×2 IMPLANT
NEEDLE 22X1 1/2 (OR ONLY) (NEEDLE) ×2 IMPLANT
NEEDLE SPNL 18GX3.5 QUINCKE PK (NEEDLE) ×2 IMPLANT
NS IRRIG 1000ML POUR BTL (IV SOLUTION) ×2 IMPLANT
PACK LAMINECTOMY ORTHO (CUSTOM PROCEDURE TRAY) ×2 IMPLANT
PACK UNIVERSAL I (CUSTOM PROCEDURE TRAY) ×2 IMPLANT
PAD ARMBOARD 7.5X6 YLW CONV (MISCELLANEOUS) ×4 IMPLANT
SPONGE LAP 4X18 RFD (DISPOSABLE) ×2 IMPLANT
SPONGE SURGIFOAM ABS GEL 100 (HEMOSTASIS) ×2 IMPLANT
STAPLER VISISTAT 35W (STAPLE) IMPLANT
SURGIFLO W/THROMBIN 8M KIT (HEMOSTASIS) ×2 IMPLANT
SUT BONE WAX W31G (SUTURE) IMPLANT
SUT MNCRL AB 3-0 PS2 27 (SUTURE) ×2 IMPLANT
SUT VIC AB 1 CT1 18XCR BRD 8 (SUTURE) ×1 IMPLANT
SUT VIC AB 1 CT1 8-18 (SUTURE) ×1
SUT VIC AB 2-0 CT1 18 (SUTURE) ×2 IMPLANT
SYR BULB IRRIG 60ML STRL (SYRINGE) ×2 IMPLANT
TAPE CLOTH 4X10 WHT NS (GAUZE/BANDAGES/DRESSINGS) ×2 IMPLANT
TOWEL GREEN STERILE (TOWEL DISPOSABLE) ×2 IMPLANT
TOWEL GREEN STERILE FF (TOWEL DISPOSABLE) ×2 IMPLANT
TRAY FOLEY MTR SLVR 16FR STAT (SET/KITS/TRAYS/PACK) ×2 IMPLANT
WATER STERILE IRR 1000ML POUR (IV SOLUTION) IMPLANT

## 2020-10-15 NOTE — Anesthesia Postprocedure Evaluation (Signed)
Anesthesia Post Note  Patient: Megan Church  Procedure(s) Performed: ANTERIOR LATERAL LUMBAR FUSION 2 LEVELS (XLIF) L2-4 (N/A Spine Lumbar)     Patient location during evaluation: PACU Anesthesia Type: General Level of consciousness: awake and alert Pain management: pain level controlled Vital Signs Assessment: post-procedure vital signs reviewed and stable Respiratory status: spontaneous breathing, nonlabored ventilation, respiratory function stable and patient connected to nasal cannula oxygen Cardiovascular status: blood pressure returned to baseline and stable Postop Assessment: no apparent nausea or vomiting Anesthetic complications: no   No complications documented.  Last Vitals:  Vitals:   10/15/20 1222 10/15/20 1236  BP: 122/63 110/69  Pulse: (!) 108 (!) 109  Resp: 15 18  Temp:    SpO2: 94% 97%    Last Pain:  Vitals:   10/15/20 1236  TempSrc:   PainSc: 7                  Candra R Alylah Blakney

## 2020-10-15 NOTE — Brief Op Note (Signed)
10/15/2020  12:00 PM  PATIENT:  Megan Church  65 y.o. 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  POST-OPERATIVE DIAGNOSIS:  Post laminectomy syndrome with recurrent stenosis and spondylolisthesis  PROCEDURE:  Procedure(s) with comments: ANTERIOR LATERAL LUMBAR FUSION 2 LEVELS (XLIF) L2-4 (N/A) - 4 hrs  SURGEON:  Surgeon(s) and Role:    Melina Schools, MD - Primary  PHYSICIAN ASSISTANT: Nelson Chimes, PA  ANESTHESIA:   general  EBL:  200 mL   BLOOD ADMINISTERED:none  DRAINS: none   LOCAL MEDICATIONS USED:  MARCAINE     SPECIMEN:  No Specimen  DISPOSITION OF SPECIMEN:  N/A  COUNTS:  YES  TOURNIQUET:  * No tourniquets in log *  DICTATION: .Dragon Dictation  PLAN OF CARE: Admit to inpatient   PATIENT DISPOSITION:  PACU - hemodynamically stable.

## 2020-10-15 NOTE — Anesthesia Procedure Notes (Signed)
Procedure Name: Intubation Date/Time: 10/15/2020 8:42 AM Performed by: Jenne Campus, CRNA Pre-anesthesia Checklist: Patient identified, Emergency Drugs available, Suction available and Patient being monitored Patient Re-evaluated:Patient Re-evaluated prior to induction Oxygen Delivery Method: Circle System Utilized Preoxygenation: Pre-oxygenation with 100% oxygen Induction Type: IV induction Ventilation: Mask ventilation without difficulty Laryngoscope Size: Miller and 2 Grade View: Grade I Tube type: Oral Tube size: 7.5 mm Number of attempts: 1 Airway Equipment and Method: Stylet and Oral airway Placement Confirmation: ETT inserted through vocal cords under direct vision,  positive ETCO2 and breath sounds checked- equal and bilateral Secured at: 21 cm Tube secured with: Tape Dental Injury: Teeth and Oropharynx as per pre-operative assessment

## 2020-10-15 NOTE — Plan of Care (Signed)
  Problem: Education: Goal: Ability to verbalize activity precautions or restrictions will improve Outcome: Progressing   Problem: Activity: Goal: Ability to avoid complications of mobility impairment will improve Outcome: Progressing   

## 2020-10-15 NOTE — Transfer of Care (Signed)
Immediate Anesthesia Transfer of Care Note  Patient: Megan Church  Procedure(s) Performed: ANTERIOR LATERAL LUMBAR FUSION 2 LEVELS (XLIF) L2-4 (N/A Spine Lumbar)  Patient Location: PACU  Anesthesia Type:General  Level of Consciousness: awake, oriented and patient cooperative  Airway & Oxygen Therapy: Patient Spontanous Breathing and Patient connected to face mask oxygen  Post-op Assessment: Report given to RN and Post -op Vital signs reviewed and stable  Post vital signs: Reviewed  Last Vitals:  Vitals Value Taken Time  BP 104/62 10/15/20 1152  Temp    Pulse 118 10/15/20 1200  Resp 14 10/15/20 1200  SpO2 80 % 10/15/20 1200  Vitals shown include unvalidated device data.  Last Pain:  Vitals:   10/15/20 0703  TempSrc:   PainSc: 2       Patients Stated Pain Goal: 1 (75/88/32 5498)  Complications: No complications documented.

## 2020-10-15 NOTE — Op Note (Signed)
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  Prior L4-5 posterior lateral fusion.  POST-OPERATIVE DIAGNOSIS: Same  PROCEDURE:   Phase 1: Lateral interbody fusion L2-3, and L3-4.  SURGEON:  Melina Schools, MD  PHYSICIAN ASSISTANT: Nelson Chimes, PA  ANESTHESIA:   General  EBL: 200 ml   Implants: [employer] 3[employer] printing titanium intervertebral spacer.  L2-3: 12x18x50 lordotic.  L3-4: 10 x 18 x 50 mm lordotic spacer  Allograft: Osteocell  Neuro monitoring: No adverse free running EMG or SSEP activity   BRIEF HISTORY: Megan Church is a 66 y.o. female who had a previous L4-5 fusion several years ago and has had a 2-year history of significant back and intermittent neuropathic leg pain.  Patient was diagnosed with a L2-3 synovial cyst causing marked left lateral recess stenosis adjacent segment degenerative disc disease L2-3 and L3-4 with a degenerative spondylolisthesis at L3-4.  Attempted conservative management had failed to alleviate her symptoms or improve her quality of life.  As a result we elected to move forward with surgery.  Given the complexity of her case was decided to do this as a two-stage surgery, phase 1 was a lateral interbody fusion L2-4; and face to with the posterior decompression L2-3 with extension of her existing L4 5 pedicle fixation to L2.  All appropriate risks, benefits, and alternatives to surgery were discussed with the patient and consent was obtained.  PROCEDURE DETAILS: Patient was brought into the operating room. After successful induction of general anesthesia and endotracheal intubation a Time Out was done. This confirmed all pertinent important data.  The patient was turned into the left  lateral decubitus position, axillary roll was placed, and all bony prominences well-padded.  The left flank was prepped and draped in a standard fashion.  Active gravity neuro monitoring representative did apply all appropriate pads.  A Foley was also inserted.  Lateral fluoroscopy images confirmed the appropriate level and marked out the incision site at the mid part of the L3 vertebral body.  I infiltrated the skin incision site with quarter percent Marcaine with epinephrine and then made a transverse incision.  Sharp dissection was carried out down to the external oblique.  I then made a second small incision 1 handbreadth posteriorly.  Through the small incision and advanced my finger down to the posterior aspect of the retroperitoneal fascia and then entered into the retroperitoneal space.  I bluntly dissected this area and then dissected through the undersurface of the oblique muscles until I could see my finger in the lateral wound that I made initially.  I then advanced the dilator along my finger into the retroperitoneal space until it was sitting on the psoas muscle over the L2-3 disc space.  I stimulated the psoas muscle to ensure was not traumatizing the plexus and then I went through the psoas down to the lateral aspect of the disc space.  Guidepin was used to hold the dilator in place and then I circumferentially stimulated to confirm satisfactory position and no compromise to the plexus.  I sequentially dilated and with each increase I stimulated.  I then put the final retractor and expose the lateral aspect the L2-3 disc space.  The posterior plate was secured with a shim into the disc space and an x-ray was taken to  confirm satisfactory positioning.  Annulotomy was performed with a 10 blade scalpel and I used a Cobb elevator to release the disc material and the contralateral annulus.  A box osteotome was used to remove the bulk of the disc material and then I used curettes and Kerrison rongeurs to  remove the remaining portion of the disc material.  I scraped the endplates to ensure had bleeding subchondral bone.  I then used the trial devices and elected to use the size 12 lordotic spacer.  The implant was obtained packed with the allograft and malleted to the appropriate depth.  After insertion I noted that there was some subsidence into the endplate but it was not significant.  I felt as though the posterior pedicle screw fixation that was planned for tomorrow would allow greater stability.  At this point with the L2-3 fusion completed I remove the retractor blades and then repositioned using the same technique at the L3-4 level once I confirmed satisfactory position with no abnormal EMG activity to suggest injury to the plexus and annulotomy was performed and I remove the disc material.  I again scraped the endplates to ensure I bleeding subchondral bone and all the cartilaginous material was removed and then I released the contralateral annulus.  After trialing I placed the 10 lordotic spacer.  This obtained excellent purchase.  All retractors were removed and I confirmed hemostasis using bipolar electrocautery as well as Floseal.  Intraoperative AP and lateral fluoroscopy views were taken and read by the radiologist confirming there were no retained surgical implants in the wound.  After final irrigation I closed the fascia of the external oblique with interrupted #1 Vicryl suture, then a layer of interrupted 2-0 Vicryl suture, and finally 3-0 Monocryl for the skin.  The posterior incision was closed in a similar fashion.  Steri-Strips and dry dressings were applied and the patient was ultimately extubated transfer the PACU without incident.  The end of the case all needle sponge counts were correct.  There were no adverse intraoperative events.  Melina Schools, MD 10/15/2020 11:15 AM

## 2020-10-15 NOTE — H&P (Signed)
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  Patient also has a synovial cyst causing nerve compression contributing to her pain.  Patient was diagnosed with a UTI on her preoperative labs and provided with antibiotics prior to surgery.  There is been no other change to her clinical exam since her last office visit of 10/06/2020.  I have gone over the two-stage surgical plan with the patient today as well as the risks and benefits.  She is expressed an understanding as well as willingness to move forward with surgery.

## 2020-10-15 NOTE — Progress Notes (Signed)
Inpatient Diabetes Program Recommendations  AACE/ADA: New Consensus Statement on Inpatient Glycemic Control (2015)  Target Ranges:  Prepandial:   less than 140 mg/dL      Peak postprandial:   less than 180 mg/dL (1-2 hours)      Critically ill patients:  140 - 180 mg/dL   Lab Results  Component Value Date   GLUCAP 200 (H) 10/15/2020   HGBA1C 8.5 (H) 10/14/2020    Review of Glycemic Control  Diabetes history: DM 2 Outpatient Diabetes medications: Levemir 55 units qhs, insulin lispro (Lyumjev) 8-10 units tid, metformin 4782 mg bid, Trulicity 3 mg weekly Current orders for Inpatient glycemic control:  Trulicity 3 mg weekly Novolog 0-15 units tid + hs  Decadron 4 mg given during surgery  Inpatient Diabetes Program Recommendations:   Consult for postop DM management  - Add Levemir 50 units qhs - Add Novolog 4 units tid meal coverage if eating >50 % of meals  Thanks,  Tama Headings RN, MSN, BC-ADM Inpatient Diabetes Coordinator Team Pager [phone] (8a-5p)

## 2020-10-16 ENCOUNTER — Encounter (HOSPITAL_COMMUNITY): Payer: Self-pay | Admitting: Orthopedic Surgery

## 2020-10-16 ENCOUNTER — Encounter (HOSPITAL_COMMUNITY)
Admission: RE | Disposition: A | Discharge status: Home / Self Care | Payer: Self-pay | Source: Home / Self Care | Attending: Orthopedic Surgery

## 2020-10-16 ENCOUNTER — Inpatient Hospital Stay (HOSPITAL_COMMUNITY): Payer: Medicare Other

## 2020-10-16 ENCOUNTER — Inpatient Hospital Stay (HOSPITAL_COMMUNITY): Payer: Medicare Other | Admitting: Anesthesiology

## 2020-10-16 ENCOUNTER — Inpatient Hospital Stay (HOSPITAL_COMMUNITY): Admission: RE | Admit: 2020-10-16 | Payer: Medicare Other | Source: Ambulatory Visit | Admitting: Orthopedic Surgery

## 2020-10-16 HISTORY — PX: LUMBAR LAMINECTOMY/DECOMPRESSION MICRODISCECTOMY: SHX5026

## 2020-10-16 LAB — GLUCOSE, CAPILLARY
Glucose-Capillary: 185 mg/dL — ABNORMAL HIGH (ref 70–99)
Glucose-Capillary: 180 mg/dL — ABNORMAL HIGH (ref 70–99)
Glucose-Capillary: 197 mg/dL — ABNORMAL HIGH (ref 70–99)
Glucose-Capillary: 291 mg/dL — ABNORMAL HIGH (ref 70–99)

## 2020-10-16 SURGERY — LUMBAR LAMINECTOMY/DECOMPRESSION MICRODISCECTOMY 2 LEVELS
Anesthesia: General

## 2020-10-16 MED ORDER — METHOCARBAMOL 500 MG PO TABS: 500.0000 mg | ORAL_TABLET | Freq: Four times a day (QID) | ORAL | Status: DC | PRN

## 2020-10-16 MED ORDER — ALBUMIN HUMAN 5 % IV SOLN: INTRAVENOUS | Status: DC | PRN

## 2020-10-16 MED ORDER — OXYCODONE HCL 5 MG PO TABS: 5.0000 mg | ORAL_TABLET | ORAL | Status: DC | PRN

## 2020-10-16 MED ORDER — PHENYLEPHRINE 40 MCG/ML (10ML) SYRINGE FOR IV PUSH (FOR BLOOD PRESSURE SUPPORT)
PREFILLED_SYRINGE | INTRAVENOUS | Status: DC | PRN
  Administered 2020-10-16 (×2): 80 ug via INTRAVENOUS
  Administered 2020-10-16: 120 ug via INTRAVENOUS
  Administered 2020-10-16 (×2): 80 ug via INTRAVENOUS

## 2020-10-16 MED ORDER — OXYCODONE HCL 5 MG/5ML PO SOLN: 5.0000 mg | Freq: Once | ORAL | Status: DC | PRN

## 2020-10-16 MED ORDER — MENTHOL 3 MG MT LOZG: 1.0000 | LOZENGE | OROMUCOSAL | Status: DC | PRN

## 2020-10-16 MED ORDER — ROCURONIUM BROMIDE 10 MG/ML (PF) SYRINGE
PREFILLED_SYRINGE | INTRAVENOUS | Status: DC | PRN
  Administered 2020-10-16: 50 mg via INTRAVENOUS

## 2020-10-16 MED ORDER — ESMOLOL HCL 100 MG/10ML IV SOLN
INTRAVENOUS | Status: DC | PRN
  Administered 2020-10-16: 10 mg via INTRAVENOUS
  Administered 2020-10-16: 20 mg via INTRAVENOUS

## 2020-10-16 MED ORDER — HEMOSTATIC AGENTS (NO CHARGE) OPTIME
TOPICAL | Status: DC | PRN
  Administered 2020-10-16: 1 via TOPICAL

## 2020-10-16 MED ORDER — HYDROMORPHONE HCL 1 MG/ML IJ SOLN: 0.2500 mg | INTRAMUSCULAR | Status: DC | PRN

## 2020-10-16 MED ORDER — CHLORHEXIDINE GLUCONATE 0.12 % MT SOLN
OROMUCOSAL | Status: AC
  Administered 2020-10-16: 15 mL via OROMUCOSAL
  Filled 2020-10-16: qty 15

## 2020-10-16 MED ORDER — GABAPENTIN 300 MG PO CAPS
300.0000 mg | ORAL_CAPSULE | Freq: Three times a day (TID) | ORAL | Status: DC
  Administered 2020-10-16 – 2020-10-19 (×8): 300 mg via ORAL
  Filled 2020-10-16 (×8): qty 1

## 2020-10-16 MED ORDER — BUPIVACAINE-EPINEPHRINE 0.25% -1:200000 IJ SOLN
INTRAMUSCULAR | Status: DC | PRN
  Administered 2020-10-16: 20 mL

## 2020-10-16 MED ORDER — ACETAMINOPHEN 650 MG RE SUPP: 650.0000 mg | RECTAL | Status: DC | PRN

## 2020-10-16 MED ORDER — OXYCODONE HCL 5 MG PO TABS
5.0000 mg | ORAL_TABLET | Freq: Once | ORAL | Status: DC | PRN
Start: 2020-10-16 — End: 2020-10-16

## 2020-10-16 MED ORDER — METHOCARBAMOL 1000 MG/10ML IJ SOLN: 500.0000 mg | Freq: Four times a day (QID) | INTRAVENOUS | Status: DC | PRN

## 2020-10-16 MED ORDER — ONDANSETRON HCL 4 MG/2ML IJ SOLN: 4.0000 mg | Freq: Once | INTRAMUSCULAR | Status: DC | PRN

## 2020-10-16 MED ORDER — ONDANSETRON HCL 4 MG PO TABS: 4.0000 mg | ORAL_TABLET | Freq: Four times a day (QID) | ORAL | Status: DC | PRN

## 2020-10-16 MED ORDER — METOPROLOL TARTRATE 5 MG/5ML IV SOLN
INTRAVENOUS | Status: AC
  Filled 2020-10-16: qty 5

## 2020-10-16 MED ORDER — HYDROMORPHONE HCL 1 MG/ML IJ SOLN
INTRAMUSCULAR | Status: DC | PRN
  Administered 2020-10-16: .2 mg via INTRAVENOUS
  Administered 2020-10-16 (×6): .1 mg via INTRAVENOUS

## 2020-10-16 MED ORDER — METOPROLOL TARTRATE 5 MG/5ML IV SOLN
5.0000 mg | Freq: Once | INTRAVENOUS | Status: AC
  Administered 2020-10-16: 3 mg via INTRAVENOUS

## 2020-10-16 MED ORDER — THROMBIN 20000 UNITS EX SOLR
CUTANEOUS | Status: DC | PRN
  Administered 2020-10-16: 20 mL via TOPICAL

## 2020-10-16 MED ORDER — ACETAMINOPHEN 325 MG PO TABS: 650.0000 mg | ORAL_TABLET | ORAL | Status: DC | PRN

## 2020-10-16 MED ORDER — SODIUM CHLORIDE 0.9 % IV SOLN: 250.0000 mL | INTRAVENOUS | Status: DC

## 2020-10-16 MED ORDER — SODIUM CHLORIDE 0.9% FLUSH
3.0000 mL | Freq: Two times a day (BID) | INTRAVENOUS | Status: DC
  Administered 2020-10-17 – 2020-10-19 (×5): 3 mL via INTRAVENOUS

## 2020-10-16 MED ORDER — PHENYLEPHRINE HCL-NACL 10-0.9 MG/250ML-% IV SOLN
INTRAVENOUS | Status: DC | PRN
  Administered 2020-10-16: 80 ug/min via INTRAVENOUS

## 2020-10-16 MED ORDER — ONDANSETRON HCL 4 MG/2ML IJ SOLN: 4.0000 mg | Freq: Four times a day (QID) | INTRAMUSCULAR | Status: DC | PRN

## 2020-10-16 MED ORDER — KETOROLAC TROMETHAMINE 30 MG/ML IJ SOLN
INTRAMUSCULAR | Status: AC
  Filled 2020-10-16: qty 1

## 2020-10-16 MED ORDER — SUGAMMADEX SODIUM 200 MG/2ML IV SOLN
INTRAVENOUS | Status: DC | PRN
  Administered 2020-10-16: 200 mg via INTRAVENOUS

## 2020-10-16 MED ORDER — FENTANYL CITRATE (PF) 250 MCG/5ML IJ SOLN
INTRAMUSCULAR | Status: DC | PRN
  Administered 2020-10-16 (×5): 50 ug via INTRAVENOUS

## 2020-10-16 MED ORDER — ESMOLOL BOLUS VIA INFUSION: 30.0000 ug | Freq: Once | INTRAVENOUS | Status: DC

## 2020-10-16 MED ORDER — OXYCODONE-ACETAMINOPHEN 10-325 MG PO TABS: 1.0000 | ORAL_TABLET | Freq: Four times a day (QID) | ORAL | 0 refills | Status: AC | PRN

## 2020-10-16 MED ORDER — SODIUM CHLORIDE 0.9 % IV SOLN
1.0000 g | Freq: Three times a day (TID) | INTRAVENOUS | Status: AC
  Administered 2020-10-16 – 2020-10-17 (×2): 1 g via INTRAVENOUS
  Filled 2020-10-16 (×2): qty 10

## 2020-10-16 MED ORDER — KETOROLAC TROMETHAMINE 30 MG/ML IJ SOLN
30.0000 mg | Freq: Once | INTRAMUSCULAR | Status: AC | PRN
  Administered 2020-10-16: 30 mg via INTRAVENOUS

## 2020-10-16 MED ORDER — MIDAZOLAM HCL 2 MG/2ML IJ SOLN
INTRAMUSCULAR | Status: DC | PRN
  Administered 2020-10-16: 2 mg via INTRAVENOUS

## 2020-10-16 MED ORDER — THROMBIN 20000 UNITS EX SOLR
CUTANEOUS | Status: AC
  Filled 2020-10-16: qty 20000

## 2020-10-16 MED ORDER — CEFAZOLIN SODIUM-DEXTROSE 2-4 GM/100ML-% IV SOLN: 2.0000 g | INTRAVENOUS | Status: DC

## 2020-10-16 MED ORDER — PHENOL 1.4 % MT LIQD
1.0000 | OROMUCOSAL | Status: DC | PRN
Start: 2020-10-16 — End: 2020-10-16

## 2020-10-16 MED ORDER — DEXAMETHASONE SODIUM PHOSPHATE 10 MG/ML IJ SOLN
INTRAMUSCULAR | Status: DC | PRN
  Administered 2020-10-16: 10 mg via INTRAVENOUS

## 2020-10-16 MED ORDER — CEFAZOLIN SODIUM-DEXTROSE 2-4 GM/100ML-% IV SOLN
INTRAVENOUS | Status: AC
  Filled 2020-10-16: qty 100

## 2020-10-16 MED ORDER — CHLORHEXIDINE GLUCONATE 0.12 % MT SOLN: 15.0000 mL | Freq: Once | OROMUCOSAL | Status: AC

## 2020-10-16 MED ORDER — 0.9 % SODIUM CHLORIDE (POUR BTL) OPTIME
TOPICAL | Status: DC | PRN
  Administered 2020-10-16: 1000 mL

## 2020-10-16 MED ORDER — ONDANSETRON HCL 4 MG PO TABS: 4.0000 mg | ORAL_TABLET | Freq: Three times a day (TID) | ORAL | 0 refills | Status: DC | PRN

## 2020-10-16 MED ORDER — OXYCODONE HCL 5 MG PO TABS: 10.0000 mg | ORAL_TABLET | ORAL | Status: DC | PRN

## 2020-10-16 MED ORDER — LIDOCAINE 2% (20 MG/ML) 5 ML SYRINGE
INTRAMUSCULAR | Status: DC | PRN
  Administered 2020-10-16: 100 mg via INTRAVENOUS

## 2020-10-16 MED ORDER — METHOCARBAMOL 500 MG PO TABS: 500.0000 mg | ORAL_TABLET | Freq: Three times a day (TID) | ORAL | 0 refills | Status: AC | PRN

## 2020-10-16 MED ORDER — LACTATED RINGERS IV SOLN: INTRAVENOUS | Status: DC

## 2020-10-16 MED ORDER — PROPOFOL 500 MG/50ML IV EMUL
INTRAVENOUS | Status: DC | PRN
  Administered 2020-10-16: 50 ug/kg/min via INTRAVENOUS
  Administered 2020-10-16: 75 ug/kg/min via INTRAVENOUS

## 2020-10-16 MED ORDER — ONDANSETRON HCL 4 MG/2ML IJ SOLN
INTRAMUSCULAR | Status: DC | PRN
  Administered 2020-10-16: 4 mg via INTRAVENOUS

## 2020-10-16 MED ORDER — BUPIVACAINE-EPINEPHRINE (PF) 0.25% -1:200000 IJ SOLN
INTRAMUSCULAR | Status: AC
  Filled 2020-10-16: qty 30

## 2020-10-16 MED ORDER — CEFAZOLIN SODIUM-DEXTROSE 2-3 GM-%(50ML) IV SOLR
INTRAVENOUS | Status: DC | PRN
  Administered 2020-10-16 (×2): 2 g via INTRAVENOUS

## 2020-10-16 MED ORDER — TRANEXAMIC ACID-NACL 1000-0.7 MG/100ML-% IV SOLN
INTRAVENOUS | Status: AC
  Filled 2020-10-16: qty 100

## 2020-10-16 MED ORDER — SODIUM CHLORIDE 0.9% FLUSH
3.0000 mL | INTRAVENOUS | Status: DC | PRN
  Administered 2020-10-19: 3 mL via INTRAVENOUS

## 2020-10-16 MED ORDER — MORPHINE SULFATE (PF) 2 MG/ML IV SOLN: 1.0000 mg | INTRAVENOUS | Status: DC | PRN

## 2020-10-16 MED ORDER — TRANEXAMIC ACID-NACL 1000-0.7 MG/100ML-% IV SOLN
INTRAVENOUS | Status: DC | PRN
  Administered 2020-10-16: 1000 mg via INTRAVENOUS

## 2020-10-16 MED ORDER — PROPOFOL 10 MG/ML IV BOLUS
INTRAVENOUS | Status: DC | PRN
  Administered 2020-10-16 (×2): 20 mg via INTRAVENOUS
  Administered 2020-10-16: 120 mg via INTRAVENOUS

## 2020-10-16 MED ORDER — GELATIN ABSORBABLE 100 EX MISC
CUTANEOUS | Status: DC | PRN
  Administered 2020-10-16: 1

## 2020-10-16 MED FILL — Thrombin For Soln Kit 20000 Unit: CUTANEOUS | Qty: 1 | Status: AC

## 2020-10-16 SURGICAL SUPPLY — 92 items
BAND RUBBER #18 3X1/16 STRL (MISCELLANEOUS) IMPLANT
BLADE BN FN 3.2XSTRL LF (MISCELLANEOUS) ×1 IMPLANT
BLADE BONE MILL FINE (MISCELLANEOUS) ×1
BLADE CLIPPER SURG (BLADE) IMPLANT
BUR EGG ELITE 4.0 (BURR) ×4 IMPLANT
BUR MATCHSTICK NEURO 3.0 LAGG (BURR) ×2 IMPLANT
CABLE BIPOLOR RESECTION CORD (MISCELLANEOUS) ×2 IMPLANT
CLIP NEUROVISION LG (CLIP) ×2 IMPLANT
CLSR STERI-STRIP ANTIMIC 1/2X4 (GAUZE/BANDAGES/DRESSINGS) ×2 IMPLANT
CONNECTOR RELINE O 30-35 5.5 (Connector) ×2 IMPLANT
CORD BIPOLAR FORCEPS 12FT (ELECTRODE) ×2 IMPLANT
COVER MAYO STAND STRL (DRAPES) ×4 IMPLANT
COVER SURGICAL LIGHT HANDLE (MISCELLANEOUS) ×2 IMPLANT
COVER WAND RF STERILE (DRAPES) ×4 IMPLANT
DRAIN CHANNEL 15F RND FF W/TCR (WOUND CARE) IMPLANT
DRAPE C-ARM 42X72 X-RAY (DRAPES) ×4 IMPLANT
DRAPE C-ARMOR (DRAPES) ×2 IMPLANT
DRAPE MICROSCOPE LEICA 46X105 (MISCELLANEOUS) IMPLANT
DRAPE POUCH INSTRU U-SHP 10X18 (DRAPES) ×4 IMPLANT
DRAPE SURG 17X11 SM STRL (DRAPES) ×2 IMPLANT
DRAPE SURG 17X23 STRL (DRAPES) ×2 IMPLANT
DRAPE U-SHAPE 47X51 STRL (DRAPES) ×4 IMPLANT
DRSG OPSITE POSTOP 4X6 (GAUZE/BANDAGES/DRESSINGS) IMPLANT
DRSG OPSITE POSTOP 4X8 (GAUZE/BANDAGES/DRESSINGS) ×2 IMPLANT
DRSG TEGADERM 2-3/8X2-3/4 SM (GAUZE/BANDAGES/DRESSINGS) ×2 IMPLANT
DURAPREP 26ML APPLICATOR (WOUND CARE) ×4 IMPLANT
ELECT BLADE 4.0 EZ CLEAN MEGAD (MISCELLANEOUS)
ELECT BLADE 6.5 EXT (BLADE) ×2 IMPLANT
ELECT CAUTERY BLADE 6.4 (BLADE) ×4 IMPLANT
ELECT PENCIL ROCKER SW 15FT (MISCELLANEOUS) ×4 IMPLANT
ELECT REM PT RETURN 9FT ADLT (ELECTROSURGICAL) ×4
ELECTRODE BLDE 4.0 EZ CLN MEGD (MISCELLANEOUS) IMPLANT
ELECTRODE REM PT RTRN 9FT ADLT (ELECTROSURGICAL) ×2 IMPLANT
EVACUATOR SILICONE 100CC (DRAIN) IMPLANT
GLOVE BIO SURGEON STRL SZ 6.5 (GLOVE) ×2 IMPLANT
GLOVE BIOGEL PI IND STRL 8.5 (GLOVE) ×1 IMPLANT
GLOVE BIOGEL PI INDICATOR 8.5 (GLOVE) ×1
GLOVE SS BIOGEL STRL SZ 8.5 (GLOVE) ×2 IMPLANT
GLOVE SUPERSENSE BIOGEL SZ 8.5 (GLOVE) ×2
GLOVE SURG POLYISO LF SZ6.5 (GLOVE) ×14 IMPLANT
GLOVE SURG UNDER POLY LF SZ6.5 (GLOVE) ×10 IMPLANT
GLOVE SURG UNDER POLY LF SZ8.5 (GLOVE) ×2 IMPLANT
GOWN STRL REUS W/ TWL LRG LVL3 (GOWN DISPOSABLE) ×5 IMPLANT
GOWN STRL REUS W/TWL 2XL LVL3 (GOWN DISPOSABLE) ×6 IMPLANT
GOWN STRL REUS W/TWL LRG LVL3 (GOWN DISPOSABLE) ×5
KIT BASIN OR (CUSTOM PROCEDURE TRAY) ×4 IMPLANT
KIT POSITION SURG JACKSON T1 (MISCELLANEOUS) IMPLANT
KIT TURNOVER KIT B (KITS) ×2 IMPLANT
MIX DBX 10CC 35% BONE (Bone Implant) ×2 IMPLANT
MODULE EMG NEEDLE SSEP NVM5 (NEEDLE) ×2 IMPLANT
MODULE NVM5 NEXT GEN EMG (NEEDLE) ×2 IMPLANT
NEEDLE 22X1 1/2 (OR ONLY) (NEEDLE) ×4 IMPLANT
NEEDLE SPNL 18GX3.5 QUINCKE PK (NEEDLE) ×6 IMPLANT
NS IRRIG 1000ML POUR BTL (IV SOLUTION) ×4 IMPLANT
PACK LAMINECTOMY ORTHO (CUSTOM PROCEDURE TRAY) ×4 IMPLANT
PACK UNIVERSAL I (CUSTOM PROCEDURE TRAY) ×4 IMPLANT
PAD ARMBOARD 7.5X6 YLW CONV (MISCELLANEOUS) ×4 IMPLANT
PATTIES SURGICAL .25X.25 (GAUZE/BANDAGES/DRESSINGS) ×2 IMPLANT
PATTIES SURGICAL .5 X.5 (GAUZE/BANDAGES/DRESSINGS) ×2 IMPLANT
PATTIES SURGICAL .5 X1 (DISPOSABLE) ×4 IMPLANT
POSITIONER HEAD PRONE TRACH (MISCELLANEOUS) ×2 IMPLANT
PROBE BALL TIP NVM5 SNG USE (BALLOONS) ×2 IMPLANT
ROD RELINE LORD 5.5X80MM (Rod) ×4 IMPLANT
SCREW LOCK (Screw) ×3 IMPLANT
SCREW LOCK FXNS SPNE MAS PL (Screw) ×3 IMPLANT
SCREW LOCK RELINE 5.5 TULIP (Screw) ×4 IMPLANT
SCREW RELINE-O POLY 7.5X40 (Screw) ×4 IMPLANT
SCREW SHANK RELINE MOD 5.5X35 (Screw) ×6 IMPLANT
SCREW TULIP 5.5 (Screw) ×6 IMPLANT
SPONGE GAUZE 2X2 8PLY STRL LF (GAUZE/BANDAGES/DRESSINGS) ×2 IMPLANT
SPONGE LAP 4X18 RFD (DISPOSABLE) ×4 IMPLANT
SPONGE SURGIFOAM ABS GEL 100 (HEMOSTASIS) ×2 IMPLANT
STAPLER VISISTAT 35W (STAPLE) IMPLANT
SURGIFLO W/THROMBIN 8M KIT (HEMOSTASIS) IMPLANT
SUT BONE WAX W31G (SUTURE) ×4 IMPLANT
SUT MNCRL AB 3-0 PS2 18 (SUTURE) ×4 IMPLANT
SUT MNCRL AB 3-0 PS2 27 (SUTURE) ×2 IMPLANT
SUT VIC AB 1 CT1 18XCR BRD 8 (SUTURE) ×2 IMPLANT
SUT VIC AB 1 CT1 27 (SUTURE)
SUT VIC AB 1 CT1 27XBRD ANTBC (SUTURE) IMPLANT
SUT VIC AB 1 CT1 8-18 (SUTURE) ×2
SUT VIC AB 1 CTX 36 (SUTURE)
SUT VIC AB 1 CTX36XBRD ANBCTR (SUTURE) IMPLANT
SUT VIC AB 2-0 CT1 18 (SUTURE) ×2 IMPLANT
SUT VICRYL 0 UR6 27IN ABS (SUTURE) ×2 IMPLANT
SYR BULB IRRIG 60ML STRL (SYRINGE) ×4 IMPLANT
SYR CONTROL 10ML LL (SYRINGE) ×4 IMPLANT
TOWEL GREEN STERILE (TOWEL DISPOSABLE) ×4 IMPLANT
TOWEL GREEN STERILE FF (TOWEL DISPOSABLE) ×4 IMPLANT
TRAY FOLEY MTR SLVR 16FR STAT (SET/KITS/TRAYS/PACK) ×2 IMPLANT
WATER STERILE IRR 1000ML POUR (IV SOLUTION) ×4 IMPLANT
YANKAUER SUCT BULB TIP NO VENT (SUCTIONS) ×2 IMPLANT

## 2020-10-16 NOTE — Progress Notes (Signed)
OT Cancellation Note  Patient Details Name: Megan Church MRN: [id number] DOB: [birth year]   Cancelled Treatment:    Reason Eval/Treat Not Completed: Patient at procedure or test/ unavailable- pt off unit at procedure. Will follow and see as able.   Jolaine Artist, OT Acute Rehabilitation Services Pager [phone] Office [phone]   [name] 10/16/2020, 8:17 AM

## 2020-10-16 NOTE — Progress Notes (Signed)
PT Cancellation Note  Patient Details Name: Megan Church MRN: [id number] DOB: 07-15-1955   Cancelled Treatment:    Reason Eval/Treat Not Completed: Patient at procedure or test/unavailable Patient off unit since this AM for procedure. Continues to be off unit. PT will re-attempt as time allows.   Nari Vannatter A. Gilford Rile PT, DPT Acute Rehabilitation Services Pager [phone] Office [phone]    Linna Hoff 10/16/2020, 4:05 PM

## 2020-10-16 NOTE — Transfer of Care (Signed)
Immediate Anesthesia Transfer of Care Note  Patient: Megan Church  Procedure(s) Performed: Revision decompression L2-3, L3-4 (N/A ) L2-L5 Fusion (N/A )  Patient Location: PACU  Anesthesia Type:General  Level of Consciousness: awake, alert  and oriented  Airway & Oxygen Therapy: Patient Spontanous Breathing and Patient connected to nasal cannula oxygen  Post-op Assessment: Report given to RN, Post -op Vital signs reviewed and stable and Patient moving all extremities X 4  Post vital signs: Reviewed and stable  Last Vitals:  Vitals Value Taken Time  BP 117/69 10/16/20 1606  Temp    Pulse 118 10/16/20 1609  Resp 14 10/16/20 1609  SpO2 89 % 10/16/20 1609  Vitals shown include unvalidated device data.  Last Pain:  Vitals:   10/16/20 0846  TempSrc:   PainSc: 3       Patients Stated Pain Goal: 1 (68/34/19 6222)  Complications: No complications documented.

## 2020-10-16 NOTE — Brief Op Note (Signed)
10/15/2020 - 10/16/2020  3:34 PM  PATIENT:  Megan Church  65 y.o. 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

## 2020-10-16 NOTE — Anesthesia Procedure Notes (Signed)
Procedure Name: Intubation Date/Time: 10/16/2020 10:40 AM Performed by: Michele Rockers, CRNA Pre-anesthesia Checklist: Patient identified, Patient being monitored, Timeout performed, Emergency Drugs available and Suction available Patient Re-evaluated:Patient Re-evaluated prior to induction Oxygen Delivery Method: Circle System Utilized Preoxygenation: Pre-oxygenation with 100% oxygen Induction Type: IV induction Ventilation: Mask ventilation without difficulty Laryngoscope Size: Miller and 2 Grade View: Grade I Tube type: Oral Tube size: 7.5 mm Number of attempts: 1 Airway Equipment and Method: Stylet Placement Confirmation: ETT inserted through vocal cords under direct vision,  positive ETCO2 and breath sounds checked- equal and bilateral Secured at: 21 cm Tube secured with: Tape Dental Injury: Teeth and Oropharynx as per pre-operative assessment

## 2020-10-16 NOTE — Discharge Instructions (Signed)

## 2020-10-16 NOTE — Progress Notes (Signed)
    Subjective: Procedure(s) (LRB): Revision decompression L2-3, L3-4 (N/A) L2-L5 Fusion (N/A) Day of Surgery  Patient reports pain as 3 on 0-10 scale.  Reports decreased at this time leg pain reports incisional back pain   Positive void Negative bowel movement Negative flatus Positive chest pain or shortness of breath  Objective: Vital signs in last 24 hours: Temp:  [97.6 F (36.4 C)-98.6 F (37 C)] 97.6 F (36.4 C) (05/05 0721) Pulse Rate:  [84-109] 84 (05/05 0721) Resp:  [14-20] 16 (05/05 0721) BP: (101-134)/(59-76) 101/65 (05/05 0721) SpO2:  [92 %-100 %] 100 % (05/05 0721) Weight:  [91.2 kg] 91.2 kg (05/05 0846)  Intake/Output from previous day: 05/04 0701 - 05/05 0700 In: 2320 [P.O.:720; I.V.:1300; IV Piggyback:300] Out: 650 [Urine:450; Blood:200]  Labs: Recent Labs    10/14/20 1031  WBC 7.5  RBC 4.64  HCT 42.8  PLT 184   Recent Labs    10/14/20 1032  NA 139  K 3.8  CL 100  CO2 28  BUN 7*  CREATININE 0.68  GLUCOSE 208*  CALCIUM 9.7   Recent Labs    10/14/20 1031  INR 1.1    Physical Exam: Neurologically intact ABD soft Intact pulses distally Incision: dressing C/D/I and no drainage Compartment soft Body mass index is 35.34 kg/m.   Assessment/Plan: Patient stable  xrays n/a  Continue mobilization with physical therapy Continue care  Patient reports that she is ambulating without assistive devices.  She does have incisional pain and there is still some residual neuropathic left leg pain.  Plan will be to move forward with doing the L2-3 decompression to remove that synovial cyst and then performed the supplemental pedicle screw fixation from L2-L5.  This will require [place] to replace the existing L4-5 pedicle screw construct.  Initially we thought she would also need a T12 kyphoplasty but this is not required at this time since she is not having any significant pain in that region.  The consent was modified to show that the T12 kyphoplasty  would be canceled for today.  Both the patient and myself initialized the change on the consent.  I reviewed the surgical plan as well as the risks and benefits with the patient and she is expressed an understanding as well as willingness to proceed with surgery.  Melina Schools, MD [facility] [phone]'

## 2020-10-16 NOTE — Op Note (Signed)
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  Degenerative spondylolisthesis L3/4.  Prior L4-5 fusion.  Status post lateral interbody fusion L2-3 and L3-4.  POST-OPERATIVE DIAGNOSIS: Same  PROCEDURE:   1.  Gill decompression and left-sided L2-3 with complete facetectomy and foraminotomy. 2.  Partial excision of large posterior lateral osteophyte/cyst from facet of L2-3 on the left side. 3.  Insertion of cortical pedicle screws bilaterally at L2, unilateral at L3.   4.  Removal of Med[employer]onic pedicle screw fixation L4 and L5: Reimplantation of pedicle screws at L5.   5.  L2-S1 instrumented fusion with application of a cross-link at the L3 level. 6.  Posterior lateral arthrodesis with autograft bone from decompression and DBX mix.  SURGEON:  Melina Schools, MD  PHYSICIAN ASSISTANT: Nelson Chimes, PA  ANESTHESIA:   General  EBL: 150 ml  NuVasive cortical pedicle screws: 5.5 x 35 mm length.  7.5 x 40 mm pedicle screws at L5.  80 mm rods.  Reline 35 mm cross-link.  Neuro monitoring: No abnormal EMG or SSEP activity.  L2 and L3 cortical screws were directly stimulated and there were no adverse activity at greater than 40 mA.  Right L5 pedicle screw demonstrated activity at 10 mA, left L5 pedicle screw no activity at greater than 40 mA.  BRIEF HISTORY: Megan Church is a 65 y.o. female who has had persistent back buttock and radicular left leg pain.  Attempts at conservative management had failed to alleviate her pain we elected to move forward with surgery.  The plan was a 2 staged lumbar fusion with revision decompression all appropriate risks, benefits, alternatives to surgery were discussed with the patient and consent was obtained.  Patient has already undergone the lateral  interbody fusions L2-3 and L3-4.  PROCEDURE DETAILS: Patient was brought into the operating room. After successful induction of general anesthesia and endotracheal intubation a Time Out was done. This confirmed all pertinent important data.  A Foley was placed and all monitors for SSEP and EMG activity were applied.  She was turned prone onto the Wilson frame and all bony prominences were padded.  The back was then prepped and draped in standard fashion.  Patient's prior posterior incision was marked out and read incised.  Sharp dissection was carried out down to the deep fascia.  I incised the deep fascia and began my dissection superiorly.  A portion of the L2 spinous process remained from her prior decompression.  I was able to palpate this and then begin to dissect down to expose the L1-2 and L2-3 facet complex.  Once I had established the bony level I was able to remove some of the very extensive scar tissue from the midline.  I continued dissecting out laterally until I was able to palpate the pedicle screw construct from her prior fusion surgery.  At this point I exposed the L2-S1 level.  Self-retaining retractor was placed and then identified the L2 pedicle on the AP view.  I placed a high-speed bur on the 7 o'clock position of the right L2 pedicle and decorticated the space.  I then placed my pedicle all and then advanced it into the pedicle aiming towards the 1 o'clock position.  This was a standard cortical screw trajectory.  I confirmed with AP and lateral fluoroscopy the pedicle awl was properly  positioned.  I repeated this on the contralateral side of L2.  I then tapped and placed the 35 mm length cortical screw.  Both screws had excellent purchase.  Prior to placing the screw I did use my ball-tipped feeler to confirm I had a solid bony canal.  On the right side I placed a L3 cortical pedicle screw in the same fashion.  At this point I removed the L4 and L5 pedicle screws that were placed at her  prior surgery.  A previous CT scan did demonstrate that the L4 pedicle screws at breached the medial aspect of the pedicle and so I elected not to reposition or replace the screws.  The L5 screws were properly seated and there was no evidence of breach on the preoperative CT scan.  As a result I remove the 6.5 x 45 mm pedicle screw and replaced it with a 7.5 x 40 mm pedicle screw.  All screws had excellent purchase.  At this point I turned my attention to the decompression.  Under using gentle dissection technique I used a Penfield 4 to slowly develop a plane underneath the lamina of L2.  I used my 2 and 3 mm Kerrison rongeur to perform a generous laminotomy of L2.  I then removed the inferior L2 facet complex in its entirety.  Identified the L2 foramen and then used my 2 and 3 mm Kerrison rongeur to begin resecting the superior portion of the L3 facet and the pars.  At this point identified the large hard osteophyte that was very adherent to the thecal sac.  A significant amount of time was spent attempting to develop a plane between the thecal sac and the osteophyte.  I ultimately was able to remove the bulk of the osteophyte and ensure that it was no longer connected to the facet complex and was essentially free mobile piece attached to the thecal sac.  A complete foraminotomy and facetectomy was performed on the left side.  I performed a Valsalva to 40 and held it for 10 seconds and there was no CSF leak.  I continue to attempt to remove and debulk the large osteophyte but I became increasingly concerned that I would either cause a significant traumatic durotomy or direct injury to the nerve root.  I did feel as though I had a significant improvement in her spinal stenosis at this level.  The thecal sac and expanded in the nerve root was decompressed.  At this point decompression complete I then applied the polyaxial heads measured and placed a appropriate size rod on the right side.  Rod placed locking caps.   Each of the 3 locking caps were torqued according to manufacture standards I used a high-speed bur to decorticate the L3-4 facet complex as well as the L 2/3.  I then placed the bone graft in the posterior lateral gutter on this right side.  After copiously irrigating wound I made sure hemostasis using bipolar electrocautery.  A deep drain was placed and brought out through a separate stab incision.  A cross-link was then placed just below the L3 pedicle screw on the right side to aid in the overall construct stability.  This was primarily done since we could not be used the L4 pedicle hole and could not place an L3 pedicle screw because of the extensive decompression.  The wound was then closed in a layered fashion with interrupted #1 Vicryl suture, 2-0 Vicryl suture, and a 3-0 Monocryl for the skin.  Steri-Strips and  a dry dressing were applied and the patient was ultimately extubated transfer the PACU without incident.  End of the case all needle sponge counts were correct.  There were no adverse intraoperative events.  Melina Schools, MD 10/16/2020 3:03 PM

## 2020-10-16 NOTE — Anesthesia Postprocedure Evaluation (Signed)
Anesthesia Post Note  Patient: Megan Church  Procedure(s) Performed: Revision decompression L2-3, L3-4 (N/A ) L2-L5 Fusion (N/A )     Patient location during evaluation: PACU Anesthesia Type: General Level of consciousness: awake and alert Pain management: pain level controlled Vital Signs Assessment: post-procedure vital signs reviewed and stable Respiratory status: spontaneous breathing, nonlabored ventilation, respiratory function stable and patient connected to nasal cannula oxygen Cardiovascular status: blood pressure returned to baseline and stable Postop Assessment: no apparent nausea or vomiting Anesthetic complications: no   No complications documented.  Last Vitals:  Vitals:   10/16/20 1651 10/16/20 1724  BP: (!) 105/56 (!) 81/52  Pulse: (!) 113 97  Resp: 20 18  Temp: 36.6 C 37.2 C  SpO2: 97% 90%    Last Pain:  Vitals:   10/16/20 1724  TempSrc: Oral  PainSc:                  Devonne Lalani S

## 2020-10-16 NOTE — Anesthesia Preprocedure Evaluation (Signed)
Anesthesia Evaluation  Patient identified by MRN, date of birth, ID band Patient awake    Reviewed: Allergy & Precautions, NPO status , Patient's Chart, lab work & pertinent test results  Airway Mallampati: II  TM Distance: >3 FB Neck ROM: Full    Dental no notable dental hx.    Pulmonary sleep apnea , former smoker,    Pulmonary exam normal breath sounds clear to auscultation       Cardiovascular negative cardio ROS Normal cardiovascular exam Rhythm:Regular Rate:Normal     Neuro/Psych negative neurological ROS  negative psych ROS   GI/Hepatic negative GI ROS, Neg liver ROS,   Endo/Other  diabetes, Type 2Hypothyroidism   Renal/GU negative Renal ROS  negative genitourinary   Musculoskeletal negative musculoskeletal ROS (+)   Abdominal   Peds negative pediatric ROS (+)  Hematology negative hematology ROS (+)   Anesthesia Other Findings   Reproductive/Obstetrics negative OB ROS                             Anesthesia Physical Anesthesia Plan  ASA: III  Anesthesia Plan: General   Post-op Pain Management:    Induction: Intravenous  PONV Risk Score and Plan: 3 and Ondansetron, Dexamethasone and Treatment may vary due to age or medical condition  Airway Management Planned: Oral ETT  Additional Equipment:   Intra-op Plan:   Post-operative Plan: Extubation in OR  Informed Consent: I have reviewed the patients History and Physical, chart, labs and discussed the procedure including the risks, benefits and alternatives for the proposed anesthesia with the patient or authorized representative who has indicated his/her understanding and acceptance.     Dental advisory given  Plan Discussed with: CRNA and Surgeon  Anesthesia Plan Comments:         Anesthesia Quick Evaluation

## 2020-10-17 ENCOUNTER — Encounter (HOSPITAL_COMMUNITY): Payer: Self-pay | Admitting: Orthopedic Surgery

## 2020-10-17 ENCOUNTER — Inpatient Hospital Stay (HOSPITAL_COMMUNITY): Payer: Medicare Other

## 2020-10-17 DIAGNOSIS — R000 Tachycardia, unspecified: Secondary | ICD-10-CM

## 2020-10-17 DIAGNOSIS — A419 Sepsis, unspecified organism: Secondary | ICD-10-CM

## 2020-10-17 LAB — COMPREHENSIVE METABOLIC PANEL
ALT: 116 U/L — ABNORMAL HIGH (ref 0–44)
AST: 192 U/L — ABNORMAL HIGH (ref 15–41)
Albumin: 2.9 g/dL — ABNORMAL LOW (ref 3.5–5.0)
Alkaline Phosphatase: 117 U/L (ref 38–126)
Anion gap: 6 (ref 5–15)
BUN: 9 mg/dL (ref 8–23)
CO2: 26 mmol/L (ref 22–32)
Calcium: 7.9 mg/dL — ABNORMAL LOW (ref 8.9–10.3)
Chloride: 104 mmol/L (ref 98–111)
Creatinine, Ser: 0.78 mg/dL (ref 0.44–1.00)
GFR, Estimated: >60 mL/min (ref >60)
Glucose, Bld: 226 mg/dL — ABNORMAL HIGH (ref 70–99)
Potassium: 3.5 mmol/L (ref 3.5–5.1)
Sodium: 136 mmol/L (ref 135–145)
Total Bilirubin: 1 mg/dL (ref 0.3–1.2)
Total Protein: 5 g/dL — ABNORMAL LOW (ref 6.5–8.1)

## 2020-10-17 LAB — CBC WITH DIFFERENTIAL/PLATELET
Abs Immature Granulocytes: 0.03 10*3/uL (ref 0.00–0.07)
Basophils Absolute: 0 10*3/uL (ref 0.0–0.1)
Basophils Relative: 0 %
Eosinophils Absolute: 0 10*3/uL (ref 0.0–0.5)
Eosinophils Relative: 0 %
HCT: 27.9 % — ABNORMAL LOW (ref 36.0–46.0)
Hemoglobin: 9.1 g/dL — ABNORMAL LOW (ref 12.0–15.0)
Immature Granulocytes: 0 %
Lymphocytes Relative: 42 %
Lymphs Abs: 2.9 10*3/uL (ref 0.7–4.0)
MCH: 30.3 pg (ref 26.0–34.0)
MCHC: 32.6 g/dL (ref 30.0–36.0)
MCV: 93 fL (ref 80.0–100.0)
Monocytes Absolute: 0.4 10*3/uL (ref 0.1–1.0)
Monocytes Relative: 6 %
Neutro Abs: 3.4 10*3/uL (ref 1.7–7.7)
Neutrophils Relative %: 52 %
Platelets: 123 10*3/uL — ABNORMAL LOW (ref 150–400)
RBC: 3 MIL/uL — ABNORMAL LOW (ref 3.87–5.11)
RDW: 14.1 % (ref 11.5–15.5)
WBC: 6.8 10*3/uL (ref 4.0–10.5)
nRBC: 0 % (ref 0.0–0.2)

## 2020-10-17 LAB — URINALYSIS, ROUTINE W REFLEX MICROSCOPIC
Bilirubin Urine: NEGATIVE
Glucose, UA: NEGATIVE mg/dL
Hgb urine dipstick: NEGATIVE
Ketones, ur: NEGATIVE mg/dL
Leukocytes,Ua: NEGATIVE
Nitrite: NEGATIVE
Protein, ur: NEGATIVE mg/dL
Specific Gravity, Urine: 1.005 (ref 1.005–1.030)
pH: 6 (ref 5.0–8.0)

## 2020-10-17 LAB — GLUCOSE, CAPILLARY
Glucose-Capillary: 225 mg/dL — ABNORMAL HIGH (ref 70–99)
Glucose-Capillary: 215 mg/dL — ABNORMAL HIGH (ref 70–99)
Glucose-Capillary: 217 mg/dL — ABNORMAL HIGH (ref 70–99)
Glucose-Capillary: 237 mg/dL — ABNORMAL HIGH (ref 70–99)

## 2020-10-17 LAB — LACTIC ACID, PLASMA
Lactic Acid, Venous: 1.6 mmol/L (ref 0.5–1.9)
Lactic Acid, Venous: 1.2 mmol/L (ref 0.5–1.9)

## 2020-10-17 LAB — APTT: aPTT: 30 seconds (ref 24–36)

## 2020-10-17 LAB — PROTIME-INR
INR: 1.3 — ABNORMAL HIGH (ref 0.8–1.2)
Prothrombin Time: 16.2 seconds — ABNORMAL HIGH (ref 11.4–15.2)

## 2020-10-17 MED ORDER — FERROUS SULFATE 325 (65 FE) MG PO TABS
325.0000 mg | ORAL_TABLET | Freq: Two times a day (BID) | ORAL | Status: DC
  Administered 2020-10-17 – 2020-10-19 (×4): 325 mg via ORAL
  Filled 2020-10-17 (×4): qty 1

## 2020-10-17 MED ORDER — LACTATED RINGERS IV BOLUS (SEPSIS)
1000.0000 mL | Freq: Once | INTRAVENOUS | Status: AC
  Administered 2020-10-17: 1000 mL via INTRAVENOUS

## 2020-10-17 MED ORDER — PANTOPRAZOLE SODIUM 40 MG PO TBEC
40.0000 mg | DELAYED_RELEASE_TABLET | Freq: Every day | ORAL | Status: DC
  Administered 2020-10-17 – 2020-10-19 (×3): 40 mg via ORAL
  Filled 2020-10-17 (×2): qty 1

## 2020-10-17 MED ORDER — LACTATED RINGERS IV SOLN: INTRAVENOUS | Status: DC

## 2020-10-17 MED ORDER — SODIUM CHLORIDE 0.9 % IV SOLN
2.0000 g | INTRAVENOUS | Status: AC
  Administered 2020-10-17: 2 g via INTRAVENOUS
  Filled 2020-10-17: qty 2

## 2020-10-17 MED ORDER — SODIUM CHLORIDE 0.9 % IV SOLN
1.0000 g | INTRAVENOUS | Status: DC
  Filled 2020-10-17: qty 10

## 2020-10-17 MED ORDER — SODIUM CHLORIDE 0.9 % IV SOLN: 1.0000 g | INTRAVENOUS | Status: DC

## 2020-10-17 MED ORDER — MELATONIN 3 MG PO TABS
3.0000 mg | ORAL_TABLET | Freq: Every day | ORAL | Status: DC
  Administered 2020-10-17 – 2020-10-18 (×2): 3 mg via ORAL
  Filled 2020-10-17 (×2): qty 1

## 2020-10-17 MED ORDER — INSULIN DETEMIR 100 UNIT/ML ~~LOC~~ SOLN
20.0000 [IU] | Freq: Two times a day (BID) | SUBCUTANEOUS | Status: DC
  Administered 2020-10-17 – 2020-10-18 (×3): 20 [IU] via SUBCUTANEOUS
  Filled 2020-10-17 (×5): qty 0.2

## 2020-10-17 MED ORDER — LACTATED RINGERS IV SOLN: INTRAVENOUS | Status: AC

## 2020-10-17 MED ORDER — SODIUM CHLORIDE 0.9 % IV SOLN
2.0000 g | Freq: Two times a day (BID) | INTRAVENOUS | Status: DC
  Administered 2020-10-17 – 2020-10-18 (×2): 2 g via INTRAVENOUS
  Filled 2020-10-17 (×3): qty 2

## 2020-10-17 MED ORDER — SODIUM CHLORIDE 0.9 % IV BOLUS
1000.0000 mL | Freq: Once | INTRAVENOUS | Status: AC
  Administered 2020-10-17: 1000 mL via INTRAVENOUS

## 2020-10-17 MED ORDER — ROSUVASTATIN CALCIUM 20 MG PO TABS
20.0000 mg | ORAL_TABLET | Freq: Every day | ORAL | Status: DC
  Administered 2020-10-17: 20 mg via ORAL
  Filled 2020-10-17 (×2): qty 1

## 2020-10-17 NOTE — Progress Notes (Signed)
Pharmacy Antibiotic Note  Megan Church is a 65 y.o. female admitted on 10/15/2020 for 2 stage lumbar fusion on 5/4 and 5/5.  Patient was getting Ancef for surgical prophylaxis.  Pharmacy has been consulted for cefepime for sepsis/UTI.  Renal function stable, afebrile, WBC WNL.  Plan: Cefepime 2gm IV Q12H Pharmacy will sign off and follow peripherally for de-escalation plans.  Thank you for the consult!  Height: 5' 3.25" (160.7 cm) Weight: 91.2 kg (201 lb 1 oz) IBW/kg (Calculated) : 52.98  Temp (24hrs), Avg:98.8 F (37.1 C), Min:97.8 F (36.6 C), Max:102.1 F (38.9 C)  Recent Labs  Lab 10/14/20 1031 10/14/20 1032  WBC 7.5  --   CREATININE  --  0.68    Estimated Creatinine Clearance: 75.6 mL/min (by C-G formula based on SCr of 0.68 mg/dL).    Allergies  Allergen Reactions  . Sulfa Antibiotics Hives  . Ace Inhibitors Cough  . Invokana [Canagliflozin] Other (See Comments)    Yeast   . Vytorin [Ezetimibe-Simvastatin] Other (See Comments)    Leg cramps  . Atorvastatin Hives    Cefepime 5/6 >> Ancef 5/4 >> 5/6 for surgical px  5/6 UCx -  5/6 BCx -   Megan Church D. Mina Marble, PharmD, BCPS, [place] 10/17/2020, 10:46 AM

## 2020-10-17 NOTE — Progress Notes (Signed)
Called RN and asked for stat blood cultures and lactate. Currently being drawn. Transferring to Progressive bed.

## 2020-10-17 NOTE — Progress Notes (Signed)
[facility] is following this Code Spsis

## 2020-10-17 NOTE — Progress Notes (Signed)
    Subjective: Procedure(s) (LRB): Revision decompression L2-3, L3-4 (N/A) L2-L5 Fusion (N/A) 1 Day Post-Op  Patient reports pain as 3 on 0-10 scale.  Reports decreased leg pain reports incisional back pain   Positive void Negative bowel movement Positive flatus Negative chest pain or shortness of breath  Objective: Vital signs in last 24 hours: Temp:  [97.8 F (36.6 C)-102.1 F (38.9 C)] 98.5 F (36.9 C) (05/06 0809) Pulse Rate:  [97-124] 108 (05/06 0842) Resp:  [14-20] 18 (05/06 0809) BP: (81-123)/(52-69) 103/53 (05/06 0809) SpO2:  [90 %-97 %] 95 % (05/06 0842)  Intake/Output from previous day: 05/05 0701 - 05/06 0700 In: 2990 [P.O.:240; I.V.:2000; IV Piggyback:750] Out: 825 [Urine:485; Drains:190; Blood:150]  Labs: No results for input(s): WBC, RBC, HCT, PLT in the last 72 hours. No results for input(s): NA, K, CL, CO2, BUN, CREATININE, GLUCOSE, CALCIUM in the last 72 hours. No results for input(s): LABPT, INR in the last 72 hours.  Physical Exam: Neurologically intact ABD soft Intact pulses distally Incision: dressing C/D/I and no drainage Compartment soft Body mass index is 35.34 kg/m.  Assessment/Plan: Patient stable  xrays pending Continue mobilization with physical therapy Continue care  1.  Patient had an episode of dehydration/hypovolemia.  Positive response to a 1 L bolus.  Patient currently denies chest pain, shortness of breath, or confusion.  She is alert and oriented x3. 2.  As a result in the bradycardia and tachypnea I am going to order a lower extremity DVT scan/Doppler as well as a chest x-ray. 3.  Appropriate blood work has been drawn and we will check hematocrit to rule out postoperative anemia.  If she is anemic then I will recommend starting iron and depending on the severity of the anemia possible transfusion. 4.  At this point time we are using TED stockings/SCDs for DVT prophylaxis we will continue as long as she remains ambulatory.  If  she starts to have increasing pain fatigue due to dehydration then we will begin Lovenox. 5.  I appreciate the hospitalist consultation.  Appropriate blood cultures and urine culture have been sent.  I will restart her IV antibiotics since she still has a drain in place. 6.  Drain output has been 190 cc since surgery and so I would like to keep that and until it is routinely below 30 on his shift. 7.  Unlikely that the patient will be discharged either today or tomorrow and so I will start transfer orders to 4N.   Melina Schools, MD [facility] [phone]

## 2020-10-17 NOTE — Consult Note (Signed)
Medical Consultation   Megan Church  IHK:[id number]  DOB: 1955-11-29  DOA: 10/15/2020  PCP: [name], MD   Outpatient Specialists: Dr. Renea Ee   Requesting physician: Dr. Rolena Infante  Reason for consultation: Hypotension fever and tachycardia   History of Present Illness: Megan Church is an 65 y.o. female past medical history of diabetes mellitus type 2 with a last A1c of 8.5, morbid obesity, primary hypothyroidism sleep apnea came into the hospital on 10/15/2020 for two-stage lumbar fusion, on her preop evaluation her UA shows signs of infection she was started on Ancef she is status post revision with decompression of L2-L3 L3 and L4 fusion of L5-L5 on 10/15/2020, who became hypotensive on the day of the consult with a temperature of 102.1 she was fluid resuscitated with a liter of lactated Ringer's and her blood pressure improved and tachycardia resolved.  So we are consulted for possible sepsis management.   Review of Systems:  As per HPI otherwise 10 point review of systems negative.   Review of Systems Past Medical History: Past Medical History:  Diagnosis Date  . Anemia   . Anxiety   . Arthritis   . Cancer ([place])    basal cell carcinoma right arm and nose spots removed  . Depression   . GERD (gastroesophageal reflux disease)   . HLD (hyperlipidemia) 02/20/2019  . Morbid obesity ([place]) 02/20/2019  . Primary hypothyroidism 02/20/2019  . RLS (restless legs syndrome)   . Shingles   . Sleep apnea   . Type II diabetes mellitus, uncontrolled ([place]) 02/20/2019  . Vitamin D deficiency disease 02/20/2019    Past Surgical History: Past Surgical History:  Procedure Laterality Date  . ANTERIOR LAT LUMBAR FUSION N/A 10/15/2020   Procedure: ANTERIOR LATERAL LUMBAR FUSION 2 LEVELS (XLIF) L2-4;  Surgeon: Melina Schools, MD;  Location: [facility];  Service: Orthopedics;  Laterality: N/A;  4 hrs  . BACK SURGERY  2012, 2016   lumbar fusion 2012, cleaning out of area 2016  .  BREAST BIOPSY Left 2010  . CHOLECYSTECTOMY  2011  . CLOSED REDUCTION ANKLE FRACTURE    . CLOSED REDUCTION HUMERUS FRACTURE Right 2014  . COLONOSCOPY WITH PROPOFOL N/A 02/21/2018   Procedure: COLONOSCOPY WITH PROPOFOL;  Surgeon: Danie Binder, MD;  [facility];  Service: Endoscopy;  Laterality: N/A;  1:15pm - pt knows to arrive at 10:30  . EYE SURGERY  2016   eye lid droop   . KNEE ARTHROSCOPY Right 2010  . POLYPECTOMY  02/21/2018   Procedure: POLYPECTOMY;  Surgeon: Danie Binder, MD;  [facility];  Service: Endoscopy;;  hepatic flexure polyp  . TOTAL KNEE ARTHROPLASTY Right 01/31/2017   Procedure: RIGHT TOTAL KNEE ARTHROPLASTY;  Surgeon: Paralee Cancel, MD;  Location: [facility];  Service: Orthopedics;  Laterality: Right;  90 mins     Allergies:   Allergies  Allergen Reactions  . Sulfa Antibiotics Hives  . Ace Inhibitors Cough  . Invokana [Canagliflozin] Other (See Comments)    Yeast   . Vytorin [Ezetimibe-Simvastatin] Other (See Comments)    Leg cramps  . Atorvastatin Hives     Social History:  reports that she quit smoking about 42 years ago. Her smoking use included cigarettes. She started smoking about 50 years ago. She has a 8.00 pack-year smoking history. She has never used smokeless tobacco. She reports current alcohol use of about 14.0 standard drinks  of alcohol per week. She reports that she does not use drugs.   Family History: Family History  Problem Relation Age of Onset  . Cancer Mother   . Heart disease Father   . Stroke Father   . Cancer Father   . Heart disease Brother   . Hyperlipidemia Brother   . Hyperlipidemia Brother   . Colon cancer Neg Hx       Physical Exam: Vitals:   10/17/20 0513 10/17/20 0522 10/17/20 0809 10/17/20 0842  BP: 107/63  (!) 103/53   Pulse: (!) 111 (!) 102 (!) 108 (!) 108  Resp: 16  18   Temp: (!) 102.1 F (38.9 C) 98.3 F (36.8 C) 98.5 F (36.9 C)   TempSrc: Oral Oral Oral   SpO2: 96%  92% 95%   Weight:      Height:        Constitutional:   Alert and awake, oriented x3, not in any acute distress. Eyes: PERLA, EOMI, irises appear normal, anicteric sclera,  ENMT: Normal hearing mucous membranes are moist Neck: neck appears normal, no masses, normal ROM, no thyromegaly, no JVD  CVS: S1-S2 clear, no murmur rubs or gallops, no LE edema, normal pedal pulses  Respiratory:  clear to auscultation bilaterally, no wheezing, rales or rhonchi. Respiratory effort normal. No accessory muscle use.  Abdomen: soft nontender, nondistended, normal bowel sounds, no hepatosplenomegaly, no hernias  Musculoskeletal: : no cyanosis, clubbing or edema noted bilaterally,       Neuro: Cranial nerves II-XII intact, strength, sensation, reflexes Psych: judgement and insight appear normal, stable mood and affect, mental status Skin:  her incision is dressed does not appear to have purulent drainage or erythema around it.  Data reviewed:  I have personally reviewed following labs and imaging studies Labs:  CBC: Recent Labs  Lab 10/14/20 1031  WBC 7.5  HGB 13.6  HCT 42.8  MCV 92.2  PLT [id number]    Basic Metabolic Panel: Recent Labs  Lab 10/14/20 1032  NA 139  K 3.8  CL 100  CO2 28  GLUCOSE 208*  BUN 7*  CREATININE 0.68  CALCIUM 9.7   GFR Estimated Creatinine Clearance: 75.6 mL/min (by C-G formula based on SCr of 0.68 mg/dL). Liver Function Tests: Recent Labs  Lab 10/14/20 1032  AST 49*  ALT 42  ALKPHOS 123  BILITOT 1.1  PROT 7.5  ALBUMIN 4.1   No results for input(s): LIPASE, AMYLASE in the last 168 hours. No results for input(s): AMMONIA in the last 168 hours. Coagulation profile Recent Labs  Lab 10/14/20 1031  INR 1.1    Cardiac Enzymes: No results for input(s): CKTOTAL, CKMB, CKMBINDEX, TROPONINI in the last 168 hours. BNP: Invalid input(s): POCBNP CBG: Recent Labs  Lab 10/16/20 0559 10/16/20 0840 10/16/20 1607 10/16/20 2103 10/17/20 0648  GLUCAP 185* 180* 197*  291* 225*   D-Dimer No results for input(s): DDIMER in the last 72 hours. Hgb A1c No results for input(s): HGBA1C in the last 72 hours. Lipid Profile No results for input(s): CHOL, HDL, LDLCALC, TRIG, CHOLHDL, LDLDIRECT in the last 72 hours. Thyroid function studies No results for input(s): TSH, T4TOTAL, T3FREE, THYROIDAB in the last 72 hours.  Invalid input(s): FREET3 Anemia work up No results for input(s): VITAMINB12, FOLATE, FERRITIN, TIBC, IRON, RETICCTPCT in the last 72 hours. Urinalysis    Component Value Date/Time   COLORURINE AMBER (A) 10/14/2020 1031   APPEARANCEUR CLOUDY (A) 10/14/2020 1031   LABSPEC 1.027 10/14/2020 1031   PHURINE 5.0  10/14/2020 [place] 10/14/2020 1031   HGBUR MODERATE (A) 10/14/2020 1031   BILIRUBINUR SMALL (A) 10/14/2020 1031   KETONESUR NEGATIVE 10/14/2020 1031   PROTEINUR 100 (A) 10/14/2020 1031   NITRITE NEGATIVE 10/14/2020 1031   LEUKOCYTESUR MODERATE (A) 10/14/2020 1031     Sepsis Labs Invalid input(s): PROCALCITONIN,  WBC,  LACTICIDVEN Microbiology Recent Results (from the past 240 hour(s))  SARS CORONAVIRUS 2 (TAT 6-24 HRS) Nasopharyngeal Nasopharyngeal Swab     Status: None   Collection Time: 10/14/20  9:59 AM   Specimen: Nasopharyngeal Swab  Result Value Ref Range Status   SARS Coronavirus 2 NEGATIVE NEGATIVE Final    Comment: (NOTE) SARS-CoV-2 target nucleic acids are NOT DETECTED.  The SARS-CoV-2 RNA is generally detectable in upper and lower respiratory specimens during the acute phase of infection. Negative results do not preclude SARS-CoV-2 infection, do not rule out co-infections with other pathogens, and should not be used as the sole basis for treatment or other patient management decisions. Negative results must be combined with clinical observations, patient history, and epidemiological information. The expected result is Negative.  Fact Sheet for  Patients: SugarRoll.be  Fact Sheet for Healthcare Providers: https://www.woods-mathews.com/  This test is not yet approved or cleared by the [place] FDA and  has been authorized for detection and/or diagnosis of SARS-CoV-2 by FDA under an Emergency Use Authorization (EUA). This EUA will remain  in effect (meaning this test can be used) for the duration of the COVID-19 declaration under Se ction 564(b)(1) of the Act, 21 U.S.C. section 360bbb-3(b)(1), unless the authorization is terminated or revoked sooner.  Performed at [facility], [place] [address], [place], [place] [address]   Surgical pcr screen     Status: None   Collection Time: 10/14/20 10:31 AM   Specimen: Nasal Mucosa; Nasal Swab  Result Value Ref Range Status   MRSA, PCR NEGATIVE NEGATIVE Final   Staphylococcus aureus NEGATIVE NEGATIVE Final    Comment: (NOTE) The Xpert SA Assay (FDA approved for NASAL specimens in patients 48 years of age and older), is one component of a comprehensive surveillance program. It is not intended to diagnose infection nor to guide or monitor treatment. Performed at [facility], [place] [address] [place], [place] [address]        Inpatient Medications:   Scheduled Meds: . Dulaglutide  3 mg Subcutaneous Weekly  . gabapentin  300 mg Oral TID  . insulin aspart  0-15 Units Subcutaneous TID WC  . insulin aspart  0-5 Units Subcutaneous QHS  . insulin detemir  20 Units Subcutaneous BID  . melatonin  3 mg Oral QHS  . pantoprazole  40 mg Oral Daily  . rOPINIRole  3 mg Oral QHS  . rosuvastatin  20 mg Oral Daily  . sodium chloride flush  3 mL Intravenous Q12H  . sodium chloride flush  3 mL Intravenous Q12H  . thyroid  90 mg Oral Daily   Continuous Infusions: . sodium chloride    . cefTRIAXone (ROCEPHIN)  IV    . lactated ringers 1,000 mL (10/17/20 1018)  . lactated ringers    . methocarbamol (ROBAXIN) IV       Radiological  Exams on Admission: DG Lumbar Spine 2-3 Views  Result Date: 10/16/2020 CLINICAL DATA:  Surgery, elective. Additional history provided: L2-L5 fusion. Provided fluoroscopy time 1 minutes, 58 seconds (53.22 mGy). EXAM: LUMBAR SPINE - 2-3 VIEW; DG C-ARM 1-60 MIN COMPARISON:  Intraoperative fluoroscopic images of  the lumbar spine 10/15/2020. Lumbar spine MRI 04/26/2020. FINDINGS: AP and lateral view intraoperative fluoroscopic images of the lumbar spine are submitted, 4 images total. Exact levels are difficult to ascertain given the provided field of view. However, a posterior spinal fusion construct spans presumably the L2-L5 levels (bilateral pedicle screws at L2, unilateral pedicle screw at L3 and bilateral pedicle screws at L5). Interbody devices are present at what are presumably the L2-L3 and L3-L4 levels. IMPRESSION: Four intraoperative fluoroscopic images of the lumbar spine, as described. Electronically Signed   By: Kellie Simmering DO   On: 10/16/2020 15:46   DG Lumbar Spine 2-3 Views  Result Date: 10/15/2020 CLINICAL DATA:  Lumbar spine surgery.  Assess for instrument count. EXAM: LUMBAR SPINE - 2-3 VIEW; DG C-ARM 1-60 MIN COMPARISON:  Lumbar spine radiographs, 04/09/2020 FINDINGS: On the field of view, from the spot portable fluoroscopic images obtained in the anterolateral projections, there is no evidence of a retained surgical instrument, needle or sponge. Intervertebral fusion cages are noted at L2-L3 and L3-L4, well centered, new compared to the prior radiographs. Bilateral pedicle screws interconnecting rods at L4 and L5 ir stable from prior radiographs. IMPRESSION: 1. No visualized retained surgical instrument, needle or sponge. Field of view was limited by the portable fluoroscopic technique. Results were called to the operating room at the time of this dictation. 2. Interbody fusions at L2-L3 and L3-L4. Electronically Signed   By: Lajean Manes M.D.   On: 10/15/2020 11:19   DG C-Arm 1-60  Min  Result Date: 10/16/2020 CLINICAL DATA:  Surgery, elective. Additional history provided: L2-L5 fusion. Provided fluoroscopy time 1 minutes, 58 seconds (53.22 mGy). EXAM: LUMBAR SPINE - 2-3 VIEW; DG C-ARM 1-60 MIN COMPARISON:  Intraoperative fluoroscopic images of the lumbar spine 10/15/2020. Lumbar spine MRI 04/26/2020. FINDINGS: AP and lateral view intraoperative fluoroscopic images of the lumbar spine are submitted, 4 images total. Exact levels are difficult to ascertain given the provided field of view. However, a posterior spinal fusion construct spans presumably the L2-L5 levels (bilateral pedicle screws at L2, unilateral pedicle screw at L3 and bilateral pedicle screws at L5). Interbody devices are present at what are presumably the L2-L3 and L3-L4 levels. IMPRESSION: Four intraoperative fluoroscopic images of the lumbar spine, as described. Electronically Signed   By: Kellie Simmering DO   On: 10/16/2020 15:46   DG C-Arm 1-60 Min  Result Date: 10/15/2020 CLINICAL DATA:  Lumbar spine surgery.  Assess for instrument count. EXAM: LUMBAR SPINE - 2-3 VIEW; DG C-ARM 1-60 MIN COMPARISON:  Lumbar spine radiographs, 04/09/2020 FINDINGS: On the field of view, from the spot portable fluoroscopic images obtained in the anterolateral projections, there is no evidence of a retained surgical instrument, needle or sponge. Intervertebral fusion cages are noted at L2-L3 and L3-L4, well centered, new compared to the prior radiographs. Bilateral pedicle screws interconnecting rods at L4 and L5 ir stable from prior radiographs. IMPRESSION: 1. No visualized retained surgical instrument, needle or sponge. Field of view was limited by the portable fluoroscopic technique. Results were called to the operating room at the time of this dictation. 2. Interbody fusions at L2-L3 and L3-L4. Electronically Signed   By: Lajean Manes M.D.   On: 10/15/2020 11:19    Impression/Recommendations Status post lumbar fusion: Further  management per orthopedic surgery. Narcotic control per surgery.  Fever tachycardia and hypotension likely due to sepsis : She had a urine culture on admission that appears to be dirty I will go ahead and repeat that  she has received 2 doses of IV antibiotics.  Urine culture and blood cultures will be low yield as she has recently received 2 doses of antibiotics. Check a blood culture was started on IV vancomycin and cefepime. The differential is broad including urinary tract infection aspiration pneumonia versus infection of the surgical site. Monitor strict I's and O's. Check a urine culture blood cultures check a chest x-ray We will go ahead and fluid resuscitate her with lactated Ringer's monitor strict I's and O's and daily weights. Check a CBC with differential and a basic metabolic panel. Will also check a lactate.  Type II diabetes mellitus, uncontrolled ([place]) With elevated A1c of 8.5, will go ahead and start her on long-acting insulin plus sliding scale hold oral hypoglycemic agents.  Check CBGs before meals and at bedtime.  Morbid obesity ([place]) Noted.  HLD (hyperlipidemia) Continue statins.    Thank you for this consultation.  Our [facility] hospitalist team will follow the patient with you.    Charlynne Cousins M.D. [facility] 10/17/2020, 10:40 AM

## 2020-10-17 NOTE — Evaluation (Signed)
Physical Therapy Evaluation  Patient Details Name: Megan Church MRN: [id number] DOB: September 16, 1955 Today's Date: 10/17/2020   History of Present Illness  Pt is a 65 y/o female who presents s/p L2-L4 ALIF on 10/16/2020. PMH significant for DM type II, shingles, RLS, hypothyroidism, skin CA, R TKR, prior back surgery in 2012 and 2016.  Clinical Impression  Pt admitted with above diagnosis. At the time of PT eval, pt was able to demonstrate transfers and ambulation with gross supervision for safety and no AD. Pt was educated on precautions, brace application/wearing schedule, appropriate activity progression, and car transfer. Pt currently with functional limitations due to the deficits listed below (see PT Problem List). Pt will benefit from skilled PT to increase their independence and safety with mobility to allow discharge to the venue listed below.    Of note, pt on 3L/min supplemental O2 upon PT arrival with sats 95% at rest. Pt on RA throughout session. At end of gait training, vitals as follows: 135/62 BP, SpO2 85% on RA, HR 138 bpm. RN and MD aware.    Follow Up Recommendations No PT follow up;Supervision for mobility/OOB    Equipment Recommendations  None recommended by PT    Recommendations for Other Services       Precautions / Restrictions Precautions Precautions: Fall;Back Precaution Booklet Issued: No Precaution Comments: Reviewed precautions during functional mobility. Required Braces or Orthoses: Spinal Brace Spinal Brace: Lumbar corset;Applied in standing position Restrictions Weight Bearing Restrictions: No      Mobility  Bed Mobility Overal bed mobility: Needs Assistance Bed Mobility: Rolling;Sit to Sidelying Rolling: Supervision       Sit to sidelying: Supervision General bed mobility comments: VC's for optimal log roll technique. Pt able to complete with HOB flat and min use of rails.    Transfers Overall transfer level: Needs assistance Equipment  used: None Transfers: Sit to/from Stand Sit to Stand: Supervision         General transfer comment: VC's for hand placement on seated surface for safety. Min unsteadiness noted upon initial stand but no overt LOB.  Ambulation/Gait Ambulation/Gait assistance: Supervision Gait Distance (Feet): 200 Feet Assistive device: None Gait Pattern/deviations: Step-through pattern;Decreased stride length;Trunk flexed Gait velocity: Decreased Gait velocity interpretation: <1.8 ft/sec, indicate of risk for recurrent falls General Gait Details: VC's for improved posture and pursed lip breathing throughout. Pt mildly dyspneic 2/4 on dyspnea scale.  Stairs            Wheelchair Mobility    Modified Rankin (Stroke Patients Only)       Balance Overall balance assessment: Needs assistance Sitting-balance support: Feet supported;No upper extremity supported Sitting balance-Leahy Scale: Fair     Standing balance support: No upper extremity supported;During functional activity Standing balance-Leahy Scale: Fair                               Pertinent Vitals/Pain Pain Assessment: Faces Faces Pain Scale: Hurts little more Pain Location: Incision site Pain Descriptors / Indicators: Operative site guarding;Discomfort Pain Intervention(s): Limited activity within patient's tolerance;Monitored during session;Repositioned    Home Living Family/patient expects to be discharged to:: Private residence Living Arrangements: Spouse/significant other Available Help at Discharge: Family;Available 24 hours/day Type of Home: House Home Access: Stairs to enter   [employer] of Steps: 1 small threshold step Home Layout: One level Home Equipment: Clinical cytogeneticist - 2 wheels;Cane - single point      Prior Function Level of  Independence: Independent               Hand Dominance        Extremity/Trunk Assessment   Upper Extremity Assessment Upper Extremity  Assessment: Defer to OT evaluation    Lower Extremity Assessment Lower Extremity Assessment: Generalized weakness    Cervical / Trunk Assessment Cervical / Trunk Assessment: Other exceptions Cervical / Trunk Exceptions: s/p surgery  Communication   Communication: No difficulties  Cognition Arousal/Alertness: Awake/alert Behavior During Therapy: WFL for tasks assessed/performed Overall Cognitive Status: Within Functional Limits for tasks assessed                                        General Comments      Exercises     Assessment/Plan    PT Assessment Patient needs continued PT services  PT Problem List Decreased strength;Decreased activity tolerance;Decreased balance;Decreased mobility;Decreased knowledge of use of DME;Decreased safety awareness;Decreased knowledge of precautions;Pain       PT Treatment Interventions DME instruction;Gait training;Functional mobility training;Therapeutic activities;Therapeutic exercise;Neuromuscular re-education;Patient/family education    PT Goals (Current goals can be found in the Care Plan section)  Acute Rehab PT Goals Patient Stated Goal: Full recovery, and get her breathing back to normal. PT Goal Formulation: With patient Time For Goal Achievement: 10/24/20 Potential to Achieve Goals: Good    Frequency Min 5X/week   Barriers to discharge        Co-evaluation               AM-PAC PT "6 Clicks" Mobility  Outcome Measure Help needed turning from your back to your side while in a flat bed without using bedrails?: None Help needed moving from lying on your back to sitting on the side of a flat bed without using bedrails?: A Little Help needed moving to and from a bed to a chair (including a wheelchair)?: A Little Help needed standing up from a chair using your arms (e.g., wheelchair or bedside chair)?: A Little Help needed to walk in hospital room?: A Little Help needed climbing 3-5 steps with a railing?  : A Little 6 Click Score: 19    End of Session Equipment Utilized During Treatment: Gait belt;Back brace Activity Tolerance: Patient tolerated treatment well Patient left: in bed;with call bell/phone within reach Nurse Communication: Mobility status PT Visit Diagnosis: Unsteadiness on feet (R26.81);Pain Pain - part of body:  (incision site)    Time: 2992-4268 PT Time Calculation (min) (ACUTE ONLY): 20 min   Charges:   PT Evaluation $PT Eval Low Complexity: 1 Low          Rolinda Roan, PT, DPT Acute Rehabilitation Services Pager: [phone] Office: [place] 10/17/2020, 2:32 PM

## 2020-10-17 NOTE — Evaluation (Addendum)
Occupational Therapy Evaluation Patient Details Name: Megan Church MRN: [id number] DOB: 1956/05/22 Today's Date: 10/17/2020    History of Present Illness Pt is a 65 y/o female who presents s/p L2-L4 ALIF on 10/16/2020. PMH significant for DM type II, shingles, RLS, hypothyroidism, skin CA, R TKR, prior back surgery in 2012 and 2016.   Clinical Impression   Pt is at mod A level with LB selfcare due to back precautions and sup with mobility. Pt with previous back surgeries and has DME and A/E at home; will have 24/7 assist at home from her husband. All education completed and no further acute OT services are indicated at this time    Follow Up Recommendations  No OT follow up;Supervision - Intermittent    Equipment Recommendations  Other (comment) (LH bath sponge)    Recommendations for Other Services       Precautions / Restrictions Precautions Precautions: Fall;Back Precaution Booklet Issued: No Precaution Comments: Reviewed precautions Required Braces or Orthoses: Spinal Brace Spinal Brace: Lumbar corset;Applied in standing position Restrictions Weight Bearing Restrictions: No      Mobility Bed Mobility Overal bed mobility: Needs Assistance Bed Mobility: Rolling;Sit to Sidelying Rolling: Supervision       Sit to sidelying: Supervision General bed mobility comments: pt sitting in chair upon arriva    Transfers Overall transfer level: Needs assistance Equipment used: None Transfers: Sit to/from Stand Sit to Stand: Supervision         General transfer comment: verbal cues for no bending during sit - stand/stand - sit transitions    Balance Overall balance assessment: Needs assistance Sitting-balance support: Feet supported;No upper extremity supported Sitting balance-Leahy Scale: Good     Standing balance support: No upper extremity supported;During functional activity Standing balance-Leahy Scale: Fair                             ADL  either performed or assessed with clinical judgement   ADL Overall ADL's : Needs assistance/impaired Eating/Feeding: Independent;Sitting   Grooming: Wash/dry hands;Wash/dry face;Standing;Supervision/safety   Upper Body Bathing: Set up;Sitting   Lower Body Bathing: Moderate assistance;With caregiver independent assisting   Upper Body Dressing : Set up;Sitting   Lower Body Dressing: Moderate assistance;With caregiver independent assisting   Toilet Transfer: Supervision/safety;Ambulation;Cueing for safety   Toileting- Clothing Manipulation and Hygiene: Supervision/safety;Sit to/from stand       Functional mobility during ADLs: Supervision/safety;Cueing for safety  Reviewed use of DME and A/E for home use. Pt educated on use of LH bath sponge and sock aid       Vision Patient Visual Report: No change from baseline       Perception     Praxis      Pertinent Vitals/Pain Pain Assessment: 0-10 Pain Score: 3  Faces Pain Scale: Hurts little more Pain Location: Incision site Pain Descriptors / Indicators: Operative site guarding;Discomfort Pain Intervention(s): Monitored during session;Repositioned     Hand Dominance Right   Extremity/Trunk Assessment Upper Extremity Assessment Upper Extremity Assessment: Overall WFL for tasks assessed   Lower Extremity Assessment Lower Extremity Assessment: Defer to PT evaluation   Cervical / Trunk Assessment Cervical / Trunk Assessment: Other exceptions Cervical / Trunk Exceptions: s/p surgery   Communication Communication Communication: No difficulties   Cognition Arousal/Alertness: Awake/alert Behavior During Therapy: WFL for tasks assessed/performed Overall Cognitive Status: Within Functional Limits for tasks assessed  General Comments       Exercises     Shoulder Instructions      Home Living Family/patient expects to be discharged to:: Private residence Living  Arrangements: Spouse/significant other Available Help at Discharge: Family;Available 24 hours/day Type of Home: House Home Access: Stairs to enter [employer] of Steps: 1   Home Layout: One level     Bathroom Shower/Tub: Occupational psychologist: Handicapped height     Home Equipment: Clinical cytogeneticist - 2 wheels;Cane - single point;Adaptive equipment;Toilet riser Adaptive Equipment: Reacher        Prior Functioning/Environment Level of Independence: Independent                 OT Problem List: Decreased activity tolerance;Pain;Impaired balance (sitting and/or standing)      OT Treatment/Interventions:      OT Goals(Current goals can be found in the care plan section) Acute Rehab OT Goals Patient Stated Goal: Full recovery, and get her breathing back to normal.  OT Frequency:     Barriers to D/C:            Co-evaluation              AM-PAC OT "6 Clicks" Daily Activity     Outcome Measure Help from another person eating meals?: None Help from another person taking care of personal grooming?: None Help from another person toileting, which includes using toliet, bedpan, or urinal?: A Little Help from another person bathing (including washing, rinsing, drying)?: A Little Help from another person to put on and taking off regular upper body clothing?: None Help from another person to put on and taking off regular lower body clothing?: A Little 6 Click Score: 21   End of Session Equipment Utilized During Treatment: Back brace;Gait belt  Activity Tolerance: Patient tolerated treatment well Patient left: in chair;with call bell/phone within reach  OT Visit Diagnosis: Unsteadiness on feet (R26.81);Pain Pain - part of body:  (spine surgical site)                Time: 2878-6767 OT Time Calculation (min): 23 min Charges:  OT General Charges $OT Visit: 1 Visit OT Evaluation $OT Eval Low Complexity: 1 Low OT Treatments $Self Care/Home  Management : 8-22 mins    Britt Bottom 10/17/2020, 2:53 PM

## 2020-10-17 NOTE — Progress Notes (Signed)
Patient is transferred from room 3C08 to unit 4NP02 at this time. Alert an in stable condition. Report given to receiving nurse Ander Purpura, RN with all questions answered. Left via bed with family and all belongings at side.

## 2020-10-17 NOTE — Plan of Care (Signed)

## 2020-10-18 ENCOUNTER — Inpatient Hospital Stay (HOSPITAL_COMMUNITY): Payer: Medicare Other

## 2020-10-18 DIAGNOSIS — Z9889 Other specified postprocedural states: Secondary | ICD-10-CM

## 2020-10-18 DIAGNOSIS — E1165 Type 2 diabetes mellitus with hyperglycemia: Secondary | ICD-10-CM

## 2020-10-18 DIAGNOSIS — R000 Tachycardia, unspecified: Secondary | ICD-10-CM

## 2020-10-18 DIAGNOSIS — Z981 Arthrodesis status: Secondary | ICD-10-CM

## 2020-10-18 LAB — URINE CULTURE: Culture: NO GROWTH

## 2020-10-18 LAB — CBC
HCT: 25.1 % — ABNORMAL LOW (ref 36.0–46.0)
Hemoglobin: 8.7 g/dL — ABNORMAL LOW (ref 12.0–15.0)
MCH: 33.5 pg (ref 26.0–34.0)
MCHC: 34.7 g/dL (ref 30.0–36.0)
MCV: 96.5 fL (ref 80.0–100.0)
Platelets: 126 10*3/uL — ABNORMAL LOW (ref 150–400)
RBC: 2.6 MIL/uL — ABNORMAL LOW (ref 3.87–5.11)
RDW: 14.1 % (ref 11.5–15.5)
WBC: 6.6 10*3/uL (ref 4.0–10.5)
nRBC: 0 % (ref 0.0–0.2)

## 2020-10-18 LAB — BASIC METABOLIC PANEL
Anion gap: 6 (ref 5–15)
BUN: 5 mg/dL — ABNORMAL LOW (ref 8–23)
CO2: 28 mmol/L (ref 22–32)
Calcium: 8.6 mg/dL — ABNORMAL LOW (ref 8.9–10.3)
Chloride: 102 mmol/L (ref 98–111)
Creatinine, Ser: 0.66 mg/dL (ref 0.44–1.00)
GFR, Estimated: >60 mL/min (ref >60)
Glucose, Bld: 226 mg/dL — ABNORMAL HIGH (ref 70–99)
Potassium: 3.4 mmol/L — ABNORMAL LOW (ref 3.5–5.1)
Sodium: 136 mmol/L (ref 135–145)

## 2020-10-18 LAB — GLUCOSE, CAPILLARY
Glucose-Capillary: 180 mg/dL — ABNORMAL HIGH (ref 70–99)
Glucose-Capillary: 215 mg/dL — ABNORMAL HIGH (ref 70–99)
Glucose-Capillary: 199 mg/dL — ABNORMAL HIGH (ref 70–99)
Glucose-Capillary: 206 mg/dL — ABNORMAL HIGH (ref 70–99)

## 2020-10-18 LAB — D-DIMER, QUANTITATIVE: D-Dimer, Quant: 1.46 ug/mL-FEU — ABNORMAL HIGH (ref 0.00–0.50)

## 2020-10-18 MED ORDER — INSULIN ASPART 100 UNIT/ML IJ SOLN
5.0000 [IU] | Freq: Three times a day (TID) | INTRAMUSCULAR | Status: DC
  Administered 2020-10-19: 5 [IU] via SUBCUTANEOUS

## 2020-10-18 MED ORDER — SENNOSIDES-DOCUSATE SODIUM 8.6-50 MG PO TABS
1.0000 | ORAL_TABLET | Freq: Two times a day (BID) | ORAL | Status: DC
  Administered 2020-10-18 – 2020-10-19 (×3): 1 via ORAL
  Filled 2020-10-18 (×3): qty 1

## 2020-10-18 MED ORDER — SODIUM CHLORIDE 0.9 % IV SOLN: 2.0000 g | Freq: Three times a day (TID) | INTRAVENOUS | Status: DC

## 2020-10-18 MED ORDER — INSULIN DETEMIR 100 UNIT/ML ~~LOC~~ SOLN
25.0000 [IU] | Freq: Two times a day (BID) | SUBCUTANEOUS | Status: DC
  Administered 2020-10-18 – 2020-10-19 (×2): 25 [IU] via SUBCUTANEOUS
  Filled 2020-10-18 (×3): qty 0.25

## 2020-10-18 MED ORDER — BISACODYL 5 MG PO TBEC
5.0000 mg | DELAYED_RELEASE_TABLET | Freq: Every day | ORAL | Status: DC | PRN
  Administered 2020-10-18: 5 mg via ORAL
  Filled 2020-10-18: qty 1

## 2020-10-18 MED ORDER — POTASSIUM CHLORIDE CRYS ER 20 MEQ PO TBCR
40.0000 meq | EXTENDED_RELEASE_TABLET | Freq: Once | ORAL | Status: AC
  Administered 2020-10-18: 40 meq via ORAL
  Filled 2020-10-18: qty 2

## 2020-10-18 MED ORDER — ROSUVASTATIN CALCIUM 20 MG PO TABS
20.0000 mg | ORAL_TABLET | Freq: Every day | ORAL | Status: DC
  Administered 2020-10-18: 20 mg via ORAL
  Filled 2020-10-18: qty 1

## 2020-10-18 MED ORDER — IOHEXOL 350 MG/ML SOLN
100.0000 mL | Freq: Once | INTRAVENOUS | Status: AC | PRN
  Administered 2020-10-18: 70 mL via INTRAVENOUS

## 2020-10-18 NOTE — Progress Notes (Signed)
Physical Therapy Treatment Patient Details Name: Megan Church MRN: [id number] DOB: January 24, 1956 Today's Date: 10/18/2020    History of Present Illness Pt is a 65 y/o female who presents s/p L2-L4 ALIF on 10/16/2020. PMH significant for DM type II, shingles, RLS, hypothyroidism, skin CA, R TKR, prior back surgery in 2012 and 2016.    PT Comments    The pt continues to demo good mobility and independence with transfers and gait at this time. She was able to ambulate short distances in the room without use of AD, and without instability, but SpO2 dropped to 84-88% with good pleth on RA. The pt was then placed on 1L O2 for hallway ambulation, and was able to tolerate >17ft with SpO2 89-95% on 1L. The pt does benefit from intermittent cues for application of spinal precautions to movement, but was able to recall 3/3 without cues at start of session. Will continue to benefit from skilled PT acutely to progress activity tolerance and dynamic stability.     Follow Up Recommendations  No PT follow up;Supervision for mobility/OOB     Equipment Recommendations  None recommended by PT    Recommendations for Other Services       Precautions / Restrictions Precautions Precautions: Fall;Back Precaution Booklet Issued: No Precaution Comments: Reviewed precautions Required Braces or Orthoses: Spinal Brace Spinal Brace: Lumbar corset;Applied in standing position Restrictions Weight Bearing Restrictions: No    Mobility  Bed Mobility Overal bed mobility: Needs Assistance Bed Mobility: Rolling;Sidelying to Sit Rolling: Supervision Sidelying to sit: Supervision       General bed mobility comments: no assist given, pt able to complete without cues for log roll    Transfers Overall transfer level: Needs assistance Equipment used: None Transfers: Sit to/from Stand Sit to Stand: Supervision         General transfer comment: no assist given, pt able ot steady without  assist  Ambulation/Gait Ambulation/Gait assistance: Supervision Gait Distance (Feet): 150 Feet Assistive device: None Gait Pattern/deviations: Step-through pattern;Decreased stride length;Trunk flexed Gait velocity: 0.5 m/s Gait velocity interpretation: <1.8 ft/sec, indicate of risk for recurrent falls General Gait Details: VC for posture, SpO2 84-88% on RA with ambulation in room, placed on 1L for hallway ambulation and SpO2 maintained 89-94% with activity. Pt reports reduced SOB, no overt LOB, but slow steady gait      Balance Overall balance assessment: Needs assistance Sitting-balance support: Feet supported;No upper extremity supported Sitting balance-Leahy Scale: Good     Standing balance support: No upper extremity supported;During functional activity Standing balance-Leahy Scale: Fair Standing balance comment: static and dynamic gait without UE support                            Cognition Arousal/Alertness: Awake/alert Behavior During Therapy: WFL for tasks assessed/performed Overall Cognitive Status: Within Functional Limits for tasks assessed                                        Exercises      General Comments General comments (skin integrity, edema, etc.): SpO2 84-88% on RA with gait in room, placed on 1L for ambulation and SpO2 remained 88-94%. HR max 133      Pertinent Vitals/Pain Pain Assessment: Faces Faces Pain Scale: Hurts a little bit Pain Location: Incision site Pain Descriptors / Indicators: Operative site guarding;Discomfort Pain Intervention(s): Limited activity within patient's  tolerance;Monitored during session;Repositioned           PT Goals (current goals can now be found in the care plan section) Acute Rehab PT Goals Patient Stated Goal: Full recovery, and get her breathing back to normal. PT Goal Formulation: With patient Time For Goal Achievement: 10/24/20 Potential to Achieve Goals: Good Progress towards  PT goals: Progressing toward goals    Frequency    Min 5X/week      PT Plan Current plan remains appropriate       AM-PAC PT "6 Clicks" Mobility   Outcome Measure  Help needed turning from your back to your side while in a flat bed without using bedrails?: None Help needed moving from lying on your back to sitting on the side of a flat bed without using bedrails?: A Little Help needed moving to and from a bed to a chair (including a wheelchair)?: A Little Help needed standing up from a chair using your arms (e.g., wheelchair or bedside chair)?: A Little Help needed to walk in hospital room?: A Little Help needed climbing 3-5 steps with a railing? : A Little 6 Click Score: 19    End of Session Equipment Utilized During Treatment: Gait belt;Oxygen Activity Tolerance: Patient tolerated treatment well Patient left: in chair;with call bell/phone within reach Nurse Communication: Mobility status PT Visit Diagnosis: Unsteadiness on feet (R26.81);Pain     Time: 7824-2353 PT Time Calculation (min) (ACUTE ONLY): 25 min  Charges:  $Gait Training: 23-37 mins                     Karma Ganja, PT, DPT   Acute Rehabilitation Department Pager #: [phone]   Otho Bellows 10/18/2020, 3:34 PM

## 2020-10-18 NOTE — Progress Notes (Signed)
    Subjective: Procedure(s) (LRB): Revision decompression L2-3, L3-4 (N/A) L2-L5 Fusion (N/A) 2 Days Post-Op  Patient reports pain as 3 on 0-10 scale.  Reports decreased leg pain reports incisional back pain   Positive void Negative bowel movement Positive flatus Negative chest pain or shortness of breath  Objective: Vital signs in last 24 hours: Temp:  [98.1 F (36.7 C)-100.3 F (37.9 C)] 99.6 F (37.6 C) (05/07 0405) Pulse Rate:  [96-116] 96 (05/07 0503) Resp:  [10-20] 18 (05/07 0503) BP: (103-145)/(53-75) 125/71 (05/07 0405) SpO2:  [89 %-99 %] 89 % (05/07 0503)  Intake/Output from previous day: 05/06 0701 - 05/07 0700 In: 1925.6 [I.V.:825.6; IV Piggyback:1100] Out: 720 [Urine:600; Drains:120]  JP drain: 40 cc last shift  Labs: Recent Labs    10/17/20 1029 10/18/20 0212  WBC 6.8 6.6  RBC 3.00* 2.60*  HCT 27.9* 25.1*  PLT 123* 126*   Recent Labs    10/17/20 1029 10/18/20 0212  NA 136 136  K 3.5 3.4*  CL 104 102  CO2 26 28  BUN 9 5*  CREATININE 0.78 0.66  GLUCOSE 226* 226*  CALCIUM 7.9* 8.6*   Recent Labs    10/17/20 1029  INR 1.3*    Physical Exam: Neurologically intact ABD soft Intact pulses distally Incision: dressing C/D/I and no drainage Compartment soft Body mass index is 35.34 kg/m.  Assessment/Plan: Patient stable  Continue mobilization with physical therapy Continue care  -DVT ppx: SCDs, TEDs, ambulation -Fever w/u per hospitalist pending -Drain output has been 190 cc since surgery and so I would like to keep that and until it is routinely below 30 per shift    Bertram Savin, MD [facility] [phone]

## 2020-10-18 NOTE — Plan of Care (Signed)
  Problem: Safety: Goal: Ability to remain free from injury will improve Outcome: Progressing   Problem: Activity: Goal: Ability to avoid complications of mobility impairment will improve Outcome: Progressing   Problem: Clinical Measurements: Goal: Ability to maintain clinical measurements within normal limits will improve Outcome: Progressing

## 2020-10-18 NOTE — Progress Notes (Signed)
Lower extremity venous bilateral study completed.   Please see CV Proc for preliminary results.   Pauline Trainer, RDMS, RVT  

## 2020-10-18 NOTE — Progress Notes (Signed)
PHARMACY NOTE:  ANTIMICROBIAL RENAL DOSAGE ADJUSTMENT  Current antimicrobial regimen includes a mismatch between antimicrobial dosage and estimated renal function.  As per policy approved by the Pharmacy & Therapeutics and Medical Executive Committees, the antimicrobial dosage will be adjusted accordingly.  Current antimicrobial dosage:  Cefepime 2g IV q12h  Indication: sepsis/UTI  Renal Function:  Estimated Creatinine Clearance: 75.6 mL/min (by C-G formula based on SCr of 0.66 mg/dL).  Antimicrobial dosage has been changed to:  Cefepime 2g IV q8h   Arturo Morton, PharmD, BCPS Please check AMION for all [place] contact numbers Clinical Pharmacist 10/18/2020 2:26 PM

## 2020-10-18 NOTE — Hospital Course (Signed)
Megan Church is an 65 y.o. female past medical history of diabetes mellitus type 2 with a last A1c of 8.5, morbid obesity, primary hypothyroidism sleep apnea came into the hospital on 10/15/2020 for two-stage lumbar fusion.  Pre-op evaluation UA shows signs of infection she was started on Ancef.  She underwent  surgical revision of prior L4-5 fusion and decompression of L2-L3 L3 and L4 fusion of L5-L5 on 10/15/2020.  Patient had an episode on hypotension with a temperature of 102.1, concerning for sepsis.  We are consulted for further evaluation and medical management.

## 2020-10-18 NOTE — Progress Notes (Addendum)
PROGRESS NOTE    Megan Church   NWG:[id number]  DOB: 1955/10/22  PCP: Wilson Singer, MD    DOA: 10/15/2020 LOS: 3   Brief Narrative   Megan Church is an 65 y.o. female past medical history of diabetes mellitus type 2 with a last A1c of 8.5, morbid obesity, primary hypothyroidism sleep apnea came into the hospital on 10/15/2020 for two-stage lumbar fusion.  Pre-op evaluation UA shows signs of infection she was started on Ancef.  She underwent  surgical revision of prior L4-5 fusion and decompression of L2-L3 L3 and L4 fusion of L5-L5 on 10/15/2020.  Patient had an episode on hypotension with a temperature of 102.1, concerning for sepsis.  We are consulted for further evaluation and medical management.    Assessment & Plan   Active Problems:   Type II diabetes mellitus, uncontrolled (HCC)   Morbid obesity (HCC)   HLD (hyperlipidemia)   S/P lumbar fusion   Sepsis (HCC)   Sinus tachycardia   Hospitalist service consulted by neurosurgery for evaluation and management of possible sepsis after patient developed fever tachycardia and hypotension on 5/6.  Notably, she also has new hypoxia.   Fever, tachycardia, hypotension - Temp 102.1 on AM 5/6, with HR 100's to 110's and an episode of hypotension.  Lactate normal. Chest xray, UA and urine culture on 5/6 negative  Had rec'd 2 doses antibiotics before urine sample collected (for 'dirty' UA on 5/3 showing mod leukocytes, >50 wbc's but was contaminated with epi cells.  Pt denies urinary sx's. No GI symptoms including diarrhea. No skin findings, area around back incision sites look healthy without surrounding warmth or erythema. Mild headache, no photophobia or nuchal rigidity, but monitor. Started on empiric broad spectrum antibiotics. --Blood cultures pending, neg to date - follow. --Monitor clinically for s/sx's of any infection --Follow fever curve --Stop antibiotics and monitor clinically given no evidence for infection at  this point.   --Further eval as below:  Acute respiratory failure with hypoxia - Increasing o2 requirement 2 L yesterday, up to requiring 4L today. Given above, this concerning for possible PE.   Lower extremity dopplers negative, reassuring. D dimer is elevated so will get a CTA chest for more definitive evaluation.  She has no respiratory symptoms. --Supplement O2, keep sats at or above 90% --Follow up CTA   Transaminitis - Labs 5/7 show AST 192, ALT 116, normal AlkPhos and Tbili.  AST/ALT were 49 and 42 on 5/4. ?due to Cefepime? --Monitor LFT's, further evaluation if worsening --Stop Cefepime  Hypoalbuminemia - most likely due to albumin being a negative acute phase reactant, and pt is s/p two recent surgeries.  Albumin was normal at 4.1 on 5/4.     --Monitor --Nutrition: plenty of protein intake, supplement drinks   Type 2 Diabetes, uncontrolled - A1c 8.5% but improved from high of 11%.  Continue current regimen of basal insulin and supplemental/sliding coverage.  Hold oral meds. --Adjust insulin for inpatient goal CBG's 140-180. --5/7: add scheduled Novolog 5 units TID WC, cont sliding scale.  Increased Levemir 20>>25 BID (takes Levemir 50 at bedtime at home)  Obesity: Body mass index is 35.34 kg/m.    DVT prophylaxis: SCD's Start: 10/16/20 1748 SCD's Start: 10/15/20 1405   Diet:  Diet Orders (From admission, onward)    Start     Ordered   10/16/20 1748  Diet Carb Modified Fluid consistency: Thin; Room service appropriate? Yes  Diet effective now       Question  Answer Comment  Diet-HS Snack? Nothing   Calorie Level Medium 1600-2000   Fluid consistency: Thin   Room service appropriate? Yes      10/16/20 1747            Code Status: Full Code    Subjective 10/18/20    Pt sitting up edge of bed, about to eat lunch.  Reports feeling okay, some back pain.  Denies fever/chills overnight or today.  No dysuria or increased frequency, no cough, sore throat, SOB, N/V/D.   Has mild headache, denies photophobia or neck stiffness.  Has not been feeling 'sick'.     Disposition Plan & Communication   Status is: Inpatient  As per primary neurosurgery service  Inpatient status appropriate due to severity of illness as outlined above.   Consults, Antimicrobials   Consultants:   TRH / Hospitalist service  Primary Orthopedic Surgery    Antimicrobials:  Anti-infectives (From admission, onward)   Start     Dose/Rate Route Frequency Ordered Stop   10/17/20 2300  ceFEPIme (MAXIPIME) 2 g in sodium chloride 0.9 % 100 mL IVPB        2 g 200 mL/hr over 30 Minutes Intravenous Every 12 hours 10/17/20 1048     10/17/20 1145  ceFEPIme (MAXIPIME) 2 g in sodium chloride 0.9 % 100 mL IVPB        2 g 200 mL/hr over 30 Minutes Intravenous STAT 10/17/20 1048 10/17/20 1156   10/17/20 1100  cefTRIAXone (ROCEPHIN) 1 g in sodium chloride 0.9 % 100 mL IVPB  Status:  Discontinued        1 g 200 mL/hr over 30 Minutes Intravenous Every 24 hours 10/17/20 0959 10/17/20 1042   10/17/20 1100  cefTRIAXone (ROCEPHIN) 1 g in sodium chloride 0.9 % 100 mL IVPB  Status:  Discontinued        1 g 200 mL/hr over 30 Minutes Intravenous Every 24 hours 10/17/20 1008 10/17/20 1016   10/16/20 1930  ceFAZolin (ANCEF) 1 g in sodium chloride 0.9 % 100 mL IVPB        1 g 200 mL/hr over 30 Minutes Intravenous Every 8 hours 10/16/20 1747 10/17/20 0320   10/16/20 0356  ceFAZolin (ANCEF) IVPB 2g/100 mL premix  Status:  Discontinued        2 g 200 mL/hr over 30 Minutes Intravenous 30 min pre-op 10/16/20 0357 10/16/20 0421   10/15/20 1730  ceFAZolin (ANCEF) 1 g in sodium chloride 0.9 % 100 mL IVPB        1 g 200 mL/hr over 30 Minutes Intravenous Every 8 hours 10/15/20 1404 10/16/20 0048   10/15/20 0644  ceFAZolin (ANCEF) IVPB 2g/100 mL premix        2 g 200 mL/hr over 30 Minutes Intravenous 30 min pre-op 10/15/20 0644 10/15/20 0915        Micro    Objective   Vitals:   10/18/20 0200 10/18/20  0405 10/18/20 0503 10/18/20 0735  BP:  125/71  (!) 119/58  Pulse: 100 99 96 95  Resp: 10 18 18 14   Temp:  99.6 F (37.6 C)  98.6 F (37 C)  TempSrc:  Oral  Oral  SpO2: 95% 92% (!) 89% 94%  Weight:      Height:        Intake/Output Summary (Last 24 hours) at 10/18/2020 0759 Last data filed at 10/18/2020 0504 Gross per 24 hour  Intake 1925.55 ml  Output 720 ml  Net 1205.55 ml  Filed Weights   10/15/20 0646 10/16/20 0846  Weight: 91.2 kg 91.2 kg    Physical Exam:  General exam: awake, alert, no acute distress HEENT: atraumatic, clear conjunctiva, anicteric sclera, moist mucus membranes, hearing grossly normal  Respiratory system: CTAB with diminished bases, no wheezes, rales or rhonchi, normal respiratory effort, on 4 L/min nasal cannula oxygen. Cardiovascular system: normal S1/S2, RRR, no pedal edema.   Gastrointestinal system: soft, NT, ND Central nervous system: A&O x4. no gross focal neurologic deficits, normal speech, no nuchal rigidity Extremities: moves all, no edema, normal tone, no cyanosis Skin: dry, intact, normal temperature, areas surrounding surgical incision sites has no erythema, differential warmth or swelling/induration Psychiatry: normal mood, congruent affect, judgement and insight appear normal  Labs   Data Reviewed: I have personally reviewed following labs and imaging studies  CBC: Recent Labs  Lab 10/14/20 1031 10/17/20 1029 10/18/20 0212  WBC 7.5 6.8 6.6  NEUTROABS  --  3.4  --   HGB 13.6 9.1* 8.7*  HCT 42.8 27.9* 25.1*  MCV 92.2 93.0 96.5  PLT 184 123* 631*   Basic Metabolic Panel: Recent Labs  Lab 10/14/20 1032 10/17/20 1029 10/18/20 0212  NA 139 136 136  K 3.8 3.5 3.4*  CL 100 104 102  CO2 28 26 28   GLUCOSE 208* 226* 226*  BUN 7* 9 5*  CREATININE 0.68 0.78 0.66  CALCIUM 9.7 7.9* 8.6*   GFR: Estimated Creatinine Clearance: 75.6 mL/min (by C-G formula based on SCr of 0.66 mg/dL). Liver Function Tests: Recent Labs  Lab  10/14/20 1032 10/17/20 1029  AST 49* 192*  ALT 42 116*  ALKPHOS 123 117  BILITOT 1.1 1.0  PROT 7.5 5.0*  ALBUMIN 4.1 2.9*   No results for input(s): LIPASE, AMYLASE in the last 168 hours. No results for input(s): AMMONIA in the last 168 hours. Coagulation Profile: Recent Labs  Lab 10/14/20 1031 10/17/20 1029  INR 1.1 1.3*   Cardiac Enzymes: No results for input(s): CKTOTAL, CKMB, CKMBINDEX, TROPONINI in the last 168 hours. BNP (last 3 results) No results for input(s): PROBNP in the last 8760 hours. HbA1C: No results for input(s): HGBA1C in the last 72 hours. CBG: Recent Labs  Lab 10/17/20 0648 10/17/20 1236 10/17/20 1712 10/17/20 2238 10/18/20 0736  GLUCAP 225* 215* 217* 237* 180*   Lipid Profile: No results for input(s): CHOL, HDL, LDLCALC, TRIG, CHOLHDL, LDLDIRECT in the last 72 hours. Thyroid Function Tests: No results for input(s): TSH, T4TOTAL, FREET4, T3FREE, THYROIDAB in the last 72 hours. Anemia Panel: No results for input(s): VITAMINB12, FOLATE, FERRITIN, TIBC, IRON, RETICCTPCT in the last 72 hours. Sepsis Labs: Recent Labs  Lab 10/17/20 1029 10/17/20 1247  LATICACIDVEN 1.6 1.2    Recent Results (from the past 240 hour(s))  SARS CORONAVIRUS 2 (TAT 6-24 HRS) Nasopharyngeal Nasopharyngeal Swab     Status: None   Collection Time: 10/14/20  9:59 AM   Specimen: Nasopharyngeal Swab  Result Value Ref Range Status   SARS Coronavirus 2 NEGATIVE NEGATIVE Final    Comment: (NOTE) SARS-CoV-2 target nucleic acids are NOT DETECTED.  The SARS-CoV-2 RNA is generally detectable in upper and lower respiratory specimens during the acute phase of infection. Negative results do not preclude SARS-CoV-2 infection, do not rule out co-infections with other pathogens, and should not be used as the sole basis for treatment or other patient management decisions. Negative results must be combined with clinical observations, patient history, and epidemiological information.  The expected result is Negative.  Fact Sheet  for Patients: SugarRoll.be  Fact Sheet for Healthcare Providers: https://www.woods-mathews.com/  This test is not yet approved or cleared by the [place] FDA and  has been authorized for detection and/or diagnosis of SARS-CoV-2 by FDA under an Emergency Use Authorization (EUA). This EUA will remain  in effect (meaning this test can be used) for the duration of the COVID-19 declaration under Se ction 564(b)(1) of the Act, 21 U.S.C. section 360bbb-3(b)(1), unless the authorization is terminated or revoked sooner.  Performed at [facility], [place] [address] [place], [place] [address]   Surgical pcr screen     Status: None   Collection Time: 10/14/20 10:31 AM   Specimen: Nasal Mucosa; Nasal Swab  Result Value Ref Range Status   MRSA, PCR NEGATIVE NEGATIVE Final   Staphylococcus aureus NEGATIVE NEGATIVE Final    Comment: (NOTE) The Xpert SA Assay (FDA approved for NASAL specimens in patients 87 years of age and older), is one component of a comprehensive surveillance program. It is not intended to diagnose infection nor to guide or monitor treatment. Performed at [facility], [place] [address], [place], [place] [address]       Imaging Studies   DG Lumbar Spine 2-3 Views  Result Date: 10/16/2020 CLINICAL DATA:  Surgery, elective. Additional history provided: L2-L5 fusion. Provided fluoroscopy time 1 minutes, 58 seconds (53.22 mGy). EXAM: LUMBAR SPINE - 2-3 VIEW; DG C-ARM 1-60 MIN COMPARISON:  Intraoperative fluoroscopic images of the lumbar spine 10/15/2020. Lumbar spine MRI 04/26/2020. FINDINGS: AP and lateral view intraoperative fluoroscopic images of the lumbar spine are submitted, 4 images total. Exact levels are difficult to ascertain given the provided field of view. However, a posterior spinal fusion construct spans presumably the L2-L5 levels (bilateral pedicle screws at L2,  unilateral pedicle screw at L3 and bilateral pedicle screws at L5). Interbody devices are present at what are presumably the L2-L3 and L3-L4 levels. IMPRESSION: Four intraoperative fluoroscopic images of the lumbar spine, as described. Electronically Signed   By: Kellie Simmering DO   On: 10/16/2020 15:46   DG CHEST PORT 1 VIEW  Result Date: 10/17/2020 CLINICAL DATA:  Postop fever.  Recent back surgery. EXAM: PORTABLE CHEST 1 VIEW COMPARISON:  Radiographs 10/14/2020 and 04/09/2020. FINDINGS: 1048 hours. Lower lung volumes with minimally increased atelectasis at both lung bases. No confluent airspace opacity, edema, pleural effusion or pneumothorax. The heart size and mediastinal contours are stable. The bones appear unremarkable. IMPRESSION: Minimal bibasilar atelectasis.  No focal airspace disease. Electronically Signed   By: Richardean Sale M.D.   On: 10/17/2020 13:49   DG C-Arm 1-60 Min  Result Date: 10/16/2020 CLINICAL DATA:  Surgery, elective. Additional history provided: L2-L5 fusion. Provided fluoroscopy time 1 minutes, 58 seconds (53.22 mGy). EXAM: LUMBAR SPINE - 2-3 VIEW; DG C-ARM 1-60 MIN COMPARISON:  Intraoperative fluoroscopic images of the lumbar spine 10/15/2020. Lumbar spine MRI 04/26/2020. FINDINGS: AP and lateral view intraoperative fluoroscopic images of the lumbar spine are submitted, 4 images total. Exact levels are difficult to ascertain given the provided field of view. However, a posterior spinal fusion construct spans presumably the L2-L5 levels (bilateral pedicle screws at L2, unilateral pedicle screw at L3 and bilateral pedicle screws at L5). Interbody devices are present at what are presumably the L2-L3 and L3-L4 levels. IMPRESSION: Four intraoperative fluoroscopic images of the lumbar spine, as described. Electronically Signed   By: Kellie Simmering DO   On: 10/16/2020 15:46     Medications   Scheduled Meds: .  ferrous sulfate  325 mg Oral BID WC  . gabapentin  300 mg Oral TID  .  insulin aspart  0-15 Units Subcutaneous TID WC  . insulin aspart  0-5 Units Subcutaneous QHS  . insulin detemir  20 Units Subcutaneous BID  . melatonin  3 mg Oral QHS  . pantoprazole  40 mg Oral Daily  . rOPINIRole  3 mg Oral QHS  . rosuvastatin  20 mg Oral Daily  . sodium chloride flush  3 mL Intravenous Q12H  . sodium chloride flush  3 mL Intravenous Q12H  . thyroid  90 mg Oral Daily   Continuous Infusions: . sodium chloride    . ceFEPime (MAXIPIME) IV 2 g (10/17/20 2304)  . methocarbamol (ROBAXIN) IV         LOS: 3 days    Time spent: 30 minutes    Ezekiel Slocumb, DO [facility]  10/18/2020, 7:59 AM      If 7PM-7AM, please contact night-coverage. How to contact the [facility] Attending or Consulting provider [place] or covering provider during after hours [place], for this patient?    1. Check the care team in [facility] and look for a) attending/consulting TR[facility] provider listed and b) the [facility] team listed 2. Log into [email or web address] and use [place]'s universal password to access. If you do not have the password, please contact the hospital operator. 3. Locate the [facility] provider you are looking for under [facility] and page to a number that you can be directly reached. 4. If you still have difficulty reaching the provider, please page the [facility] (Director on Call) for the Hospitalists listed on amion for assistance.

## 2020-10-19 DIAGNOSIS — A419 Sepsis, unspecified organism: Secondary | ICD-10-CM

## 2020-10-19 LAB — BASIC METABOLIC PANEL
Anion gap: 7 (ref 5–15)
BUN: 5 mg/dL — ABNORMAL LOW (ref 8–23)
CO2: 28 mmol/L (ref 22–32)
Calcium: 8.7 mg/dL — ABNORMAL LOW (ref 8.9–10.3)
Chloride: 101 mmol/L (ref 98–111)
Creatinine, Ser: 0.65 mg/dL (ref 0.44–1.00)
GFR, Estimated: >60 mL/min (ref >60)
Glucose, Bld: 189 mg/dL — ABNORMAL HIGH (ref 70–99)
Potassium: 3.6 mmol/L (ref 3.5–5.1)
Sodium: 136 mmol/L (ref 135–145)

## 2020-10-19 LAB — GLUCOSE, CAPILLARY
Glucose-Capillary: 181 mg/dL — ABNORMAL HIGH (ref 70–99)
Glucose-Capillary: 201 mg/dL — ABNORMAL HIGH (ref 70–99)

## 2020-10-19 LAB — CBC
HCT: 27.3 % — ABNORMAL LOW (ref 36.0–46.0)
Hemoglobin: 8.7 g/dL — ABNORMAL LOW (ref 12.0–15.0)
MCH: 29.3 pg (ref 26.0–34.0)
MCHC: 31.9 g/dL (ref 30.0–36.0)
MCV: 91.9 fL (ref 80.0–100.0)
Platelets: 155 10*3/uL (ref 150–400)
RBC: 2.97 MIL/uL — ABNORMAL LOW (ref 3.87–5.11)
RDW: 13.9 % (ref 11.5–15.5)
WBC: 6.5 10*3/uL (ref 4.0–10.5)
nRBC: 0 % (ref 0.0–0.2)

## 2020-10-19 LAB — HEPATIC FUNCTION PANEL
ALT: 151 U/L — ABNORMAL HIGH (ref 0–44)
AST: 125 U/L — ABNORMAL HIGH (ref 15–41)
Albumin: 2.6 g/dL — ABNORMAL LOW (ref 3.5–5.0)
Alkaline Phosphatase: 133 U/L — ABNORMAL HIGH (ref 38–126)
Bilirubin, Direct: 0.4 mg/dL — ABNORMAL HIGH (ref 0.0–0.2)
Indirect Bilirubin: 0.7 mg/dL (ref 0.3–0.9)
Total Bilirubin: 1.1 mg/dL (ref 0.3–1.2)
Total Protein: 5.4 g/dL — ABNORMAL LOW (ref 6.5–8.1)

## 2020-10-19 LAB — PROCALCITONIN: Procalcitonin: 0.17 ng/mL

## 2020-10-19 LAB — MAGNESIUM: Magnesium: 1.7 mg/dL (ref 1.7–2.4)

## 2020-10-19 MED ORDER — AMOXICILLIN-POT CLAVULANATE 875-125 MG PO TABS
1.0000 | ORAL_TABLET | Freq: Two times a day (BID) | ORAL | Status: DC
  Administered 2020-10-19: 1 via ORAL
  Filled 2020-10-19 (×2): qty 1

## 2020-10-19 MED ORDER — AMOXICILLIN-POT CLAVULANATE 875-125 MG PO TABS: 1.0000 | ORAL_TABLET | Freq: Two times a day (BID) | ORAL | 0 refills | Status: AC

## 2020-10-19 MED ORDER — THYROID 30 MG PO TABS
90.0000 mg | ORAL_TABLET | Freq: Every day | ORAL | Status: DC
  Administered 2020-10-19: 90 mg via ORAL
  Filled 2020-10-19: qty 3

## 2020-10-19 NOTE — Progress Notes (Signed)
PROGRESS NOTE    Megan Church   SEG:[id number]  DOB: 1955-09-03  PCP: [name], MD    DOA: 10/15/2020 LOS: 4   Brief Narrative   Megan Church is an 65 y.o. female past medical history of diabetes mellitus type 2 with a last A1c of 8.5, morbid obesity, primary hypothyroidism sleep apnea came into the hospital on 10/15/2020 for two-stage lumbar fusion.  Pre-op evaluation UA shows signs of infection she was started on Ancef.  She underwent  surgical revision of prior L4-5 fusion and decompression of L2-L3 L3 and L4 fusion of L5-L5 on 10/15/2020.  Patient had an episode on hypotension with a temperature of 102.1, concerning for sepsis.  We are consulted for further evaluation and medical management.    Assessment & Plan   Active Problems:   Type II diabetes mellitus, uncontrolled ([place])   Morbid obesity ([place])   HLD (hyperlipidemia)   S/P lumbar fusion   Sepsis ([place])   Sinus tachycardia   Hospitalist service consulted by neurosurgery for evaluation and management of possible sepsis after patient developed fever tachycardia and hypotension on 5/6.  Notably, she also has new hypoxia.  Sepsis due to Aspiration Pneumonia / Pneumonitis Fever, tachycardia, hypotension - Temp 102.1 on AM 5/6, with HR 100's to 110's and an episode of hypotension.  Lactate normal. Chest xray, UA and urine culture on 5/6 negative. Had rec'd 2 doses antibiotics before urine sample collected (for 'dirty' UA on 5/3 showing mod leukocytes, >50 wbc's but was contaminated with epi cells.  Pt denies urinary sx's. No GI symptoms including diarrhea. No skin findings, area around back incision sites look healthy without surrounding warmth or erythema. Mild headache, no photophobia or nuchal rigidity, but monitor. RLL infiltrate on CTA chest c/w Pneuonia/pneumonitis Started on empiric broad spectrum antibiotics. --Blood cultures pending, neg to date - follow. --Treat PNA with 5 days oral  Augmentin   Acute respiratory failure with hypoxia - Resolved on room air at rest today 5/8. Had increasing o2 requirement 2 >> 4 L on 5/6-7 Lower extremity dopplers negative, reassuring. D dimer elevated. CTA chest negative for PE, showed mild RLL infiltrate consistent with pneumonia/pneumonitis --Supplement O2, keep sats at or above 90% --Check ambulatory sats prior to d/c   Transaminitis - Labs 5/7 show AST 192, ALT 116, normal AlkPhos and Tbili.  AST/ALT were 49 and 42 on 5/4. ?due to Cefepime? --Monitor LFT's, further evaluation if worsening --Stopped Cefepime  Hypoalbuminemia - most likely due to albumin being a negative acute phase reactant, and pt is s/p two recent surgeries.  Albumin was normal at 4.1 on 5/4.     --Monitor --Nutrition: plenty of protein intake, supplement drinks   Type 2 Diabetes, uncontrolled - A1c 8.5% but improved from high of 11%.  Continue current regimen of basal insulin and supplemental/sliding coverage.  Hold oral meds. --Adjust insulin for inpatient goal CBG's 140-180. --5/7: add scheduled Novolog 5 units TID WC, cont sliding scale.  Increased Levemir 20>>25 BID (takes Levemir 50 at bedtime at home)  Obesity: Body mass index is 35.34 kg/m.    DVT prophylaxis: SCD's Start: 10/16/20 1748 SCD's Start: 10/15/20 1405   Diet:  Diet Orders (From admission, onward)    Start     Ordered   10/16/20 1748  Diet Carb Modified Fluid consistency: Thin; Room service appropriate? Yes  Diet effective now       Question Answer Comment  Diet-HS Snack? Nothing   Calorie Level Medium 1600-2000  Fluid consistency: Thin   Room service appropriate? Yes      10/16/20 1747            Code Status: Full Code    Subjective 10/19/20    Pt feeling better this AM, head more clear.  No fevers or chills.  Discussed CT scan showed a mild pneumonia but no blood clots in the lungs.    Disposition Plan & Communication   Status is: Inpatient  As per primary  service   From medical standpoint, patient is stable for discharge on oral antibiotics as outlined above.  Disposition per surgical primary team.        Consults, Antimicrobials   Consultants:   [place] / Hospitalist service  Primary Orthopedic Surgery    Antimicrobials:  Anti-infectives (From admission, onward)   Start     Dose/Rate Route Frequency Ordered Stop   10/19/20 1000  amoxicillin-clavulanate (AUGMENTIN) 875-125 MG per tablet 1 tablet        1 tablet Oral Every 12 hours 10/19/20 0740 10/24/20 0959   10/18/20 2100  ceFEPIme (MAXIPIME) 2 g in sodium chloride 0.9 % 100 mL IVPB  Status:  Discontinued        2 g 200 mL/hr over 30 Minutes Intravenous Every 8 hours 10/18/20 1425 10/18/20 1608   10/17/20 2300  ceFEPIme (MAXIPIME) 2 g in sodium chloride 0.9 % 100 mL IVPB  Status:  Discontinued        2 g 200 mL/hr over 30 Minutes Intravenous Every 12 hours 10/17/20 1048 10/18/20 1425   10/17/20 1145  ceFEPIme (MAXIPIME) 2 g in sodium chloride 0.9 % 100 mL IVPB        2 g 200 mL/hr over 30 Minutes Intravenous STAT 10/17/20 1048 10/17/20 1156   10/17/20 1100  cefTRIAXone (ROCEPHIN) 1 g in sodium chloride 0.9 % 100 mL IVPB  Status:  Discontinued        1 g 200 mL/hr over 30 Minutes Intravenous Every 24 hours 10/17/20 0959 10/17/20 1042   10/17/20 1100  cefTRIAXone (ROCEPHIN) 1 g in sodium chloride 0.9 % 100 mL IVPB  Status:  Discontinued        1 g 200 mL/hr over 30 Minutes Intravenous Every 24 hours 10/17/20 1008 10/17/20 1016   10/16/20 1930  ceFAZolin (ANCEF) 1 g in sodium chloride 0.9 % 100 mL IVPB        1 g 200 mL/hr over 30 Minutes Intravenous Every 8 hours 10/16/20 1747 10/17/20 0320   10/16/20 0356  ceFAZolin (ANCEF) IVPB 2g/100 mL premix  Status:  Discontinued        2 g 200 mL/hr over 30 Minutes Intravenous 30 min pre-op 10/16/20 0357 10/16/20 0421   10/15/20 1730  ceFAZolin (ANCEF) 1 g in sodium chloride 0.9 % 100 mL IVPB        1 g 200 mL/hr over 30 Minutes  Intravenous Every 8 hours 10/15/20 1404 10/16/20 0048   10/15/20 0644  ceFAZolin (ANCEF) IVPB 2g/100 mL premix        2 g 200 mL/hr over 30 Minutes Intravenous 30 min pre-op 10/15/20 0644 10/15/20 0915        Micro    Objective   Vitals:   10/18/20 1625 10/18/20 1952 10/18/20 2348 10/19/20 0410  BP: 91/75 126/64 119/66 (!) 100/54  Pulse: 99 97 94 83  Resp: 20 18 17 15   Temp: 99.3 F (37.4 C) 98.8 F (37.1 C) 98.6 F (37 C) 97.9 F (36.6 C)  TempSrc: Oral Oral Oral Oral  SpO2: 91% 96% 96% 99%  Weight:      Height:        Intake/Output Summary (Last 24 hours) at 10/19/2020 0913 Last data filed at 10/19/2020 0600 Gross per 24 hour  Intake 720 ml  Output 70 ml  Net 650 ml   Filed Weights   10/15/20 0646 10/16/20 0846  Weight: 91.2 kg 91.2 kg    Physical Exam:  General exam: awake, alert, no acute distress Respiratory system: on room air, normal respiratory effort Cardiovascular system: RRR, no pedal edema.   Central nervous system: A&O x4. normal speech Extremities: moves all, normal tone, TED hose on b/l LE's with probable trace edema Psychiatry: normal mood, congruent affect, judgement and insight appear normal  Labs   Data Reviewed: I have personally reviewed following labs and imaging studies  CBC: Recent Labs  Lab 10/14/20 1031 10/17/20 1029 10/18/20 0212 10/19/20 0217  WBC 7.5 6.8 6.6 6.5  NEUTROABS  --  3.4  --   --   HGB 13.6 9.1* 8.7* 8.7*  HCT 42.8 27.9* 25.1* 27.3*  MCV 92.2 93.0 96.5 91.9  PLT 184 123* 126* [id number]   Basic Metabolic Panel: Recent Labs  Lab 10/14/20 1032 10/17/20 1029 10/18/20 0212 10/19/20 0217  NA 139 136 136 136  K 3.8 3.5 3.4* 3.6  CL 100 104 102 101  CO2 28 26 28 28   GLUCOSE 208* 226* 226* 189*  BUN 7* 9 5* 5*  CREATININE 0.68 0.78 0.66 0.65  CALCIUM 9.7 7.9* 8.6* 8.7*  MG  --   --   --  1.7   GFR: Estimated Creatinine Clearance: 75.6 mL/min (by C-G formula based on SCr of 0.65 mg/dL). Liver Function  Tests: Recent Labs  Lab 10/14/20 1032 10/17/20 1029  AST 49* 192*  ALT 42 116*  ALKPHOS 123 117  BILITOT 1.1 1.0  PROT 7.5 5.0*  ALBUMIN 4.1 2.9*   No results for input(s): LIPASE, AMYLASE in the last 168 hours. No results for input(s): AMMONIA in the last 168 hours. Coagulation Profile: Recent Labs  Lab 10/14/20 1031 10/17/20 1029  INR 1.1 1.3*   Cardiac Enzymes: No results for input(s): CKTOTAL, CKMB, CKMBINDEX, TROPONINI in the last 168 hours. BNP (last 3 results) No results for input(s): PROBNP in the last 8760 hours. HbA1C: No results for input(s): HGBA1C in the last 72 hours. CBG: Recent Labs  Lab 10/18/20 0736 10/18/20 1116 10/18/20 1623 10/18/20 2155 10/19/20 0806  GLUCAP 180* 215* 199* 206* 181*   Lipid Profile: No results for input(s): CHOL, HDL, LDLCALC, TRIG, CHOLHDL, LDLDIRECT in the last 72 hours. Thyroid Function Tests: No results for input(s): TSH, T4TOTAL, FREET4, T3FREE, THYROIDAB in the last 72 hours. Anemia Panel: No results for input(s): VITAMINB12, FOLATE, FERRITIN, TIBC, IRON, RETICCTPCT in the last 72 hours. Sepsis Labs: Recent Labs  Lab 10/17/20 1029 10/17/20 1247 10/19/20 0217  PROCALCITON  --   --  0.17  LATICACIDVEN 1.6 1.2  --     Recent Results (from the past 240 hour(s))  SARS CORONAVIRUS 2 (TAT 6-24 HRS) Nasopharyngeal Nasopharyngeal Swab     Status: None   Collection Time: 10/14/20  9:59 AM   Specimen: Nasopharyngeal Swab  Result Value Ref Range Status   SARS Coronavirus 2 NEGATIVE NEGATIVE Final    Comment: (NOTE) SARS-CoV-2 target nucleic acids are NOT DETECTED.  The SARS-CoV-2 RNA is generally detectable in upper and lower respiratory specimens during the acute phase of infection. Negative  results do not preclude SARS-CoV-2 infection, do not rule out co-infections with other pathogens, and should not be used as the sole basis for treatment or other patient management decisions. Negative results must be combined  with clinical observations, patient history, and epidemiological information. The expected result is Negative.  Fact Sheet for Patients: SugarRoll.be  Fact Sheet for Healthcare Providers: https://www.woods-mathews.com/  This test is not yet approved or cleared by the [place] FDA and  has been authorized for detection and/or diagnosis of SARS-CoV-2 by FDA under an Emergency Use Authorization (EUA). This EUA will remain  in effect (meaning this test can be used) for the duration of the COVID-19 declaration under Se ction 564(b)(1) of the Act, 21 U.S.C. section 360bbb-3(b)(1), unless the authorization is terminated or revoked sooner.  Performed at [facility], [place] [address], [place], [place] [address]   Surgical pcr screen     Status: None   Collection Time: 10/14/20 10:31 AM   Specimen: Nasal Mucosa; Nasal Swab  Result Value Ref Range Status   MRSA, PCR NEGATIVE NEGATIVE Final   Staphylococcus aureus NEGATIVE NEGATIVE Final    Comment: (NOTE) The Xpert SA Assay (FDA approved for NASAL specimens in patients 29 years of age and older), is one component of a comprehensive surveillance program. It is not intended to diagnose infection nor to guide or monitor treatment. Performed at [facility], [place] [address] [place], [place] [address]   Culture, Urine     Status: None   Collection Time: 10/17/20  9:04 AM   Specimen: Urine, Random  Result Value Ref Range Status   Specimen Description URINE, RANDOM  Final   Special Requests NONE  Final   Culture   Final    NO GROWTH Performed at [facility], [place] [address] [place], [place] [address]    Report Status 10/18/2020 FINAL  Final  Culture, blood (routine x 2)     Status: None (Preliminary result)   Collection Time: 10/17/20  9:47 AM   Specimen: BLOOD  Result Value Ref Range Status   Specimen Description BLOOD RIGHT ANTECUBITAL  Final   Special Requests   Final     BOTTLES DRAWN AEROBIC AND ANAEROBIC Blood Culture adequate volume   Culture   Final    NO GROWTH 1 DAY Performed at [facility], [place] [address], [place], [place] [address]    Report Status PENDING  Incomplete  Culture, blood (routine x 2)     Status: None (Preliminary result)   Collection Time: 10/17/20  9:51 AM   Specimen: BLOOD RIGHT HAND  Result Value Ref Range Status   Specimen Description BLOOD RIGHT HAND  Final   Special Requests   Final    BOTTLES DRAWN AEROBIC ONLY Blood Culture results may not be optimal due to an inadequate volume of blood received in culture bottles   Culture   Final    NO GROWTH 1 DAY Performed at [facility], [place] [address] [place], [place] [address]    Report Status PENDING  Incomplete      Imaging Studies   CT ANGIO CHEST PE W OR WO CONTRAST  Result Date: 10/18/2020 CLINICAL DATA:  Respiratory distress EXAM: CT ANGIOGRAPHY CHEST WITH CONTRAST TECHNIQUE: Multidetector CT imaging of the chest was performed using the standard protocol during bolus administration of intravenous contrast. Multiplanar CT image reconstructions and MIPs were obtained to evaluate the vascular anatomy. CONTRAST:  12mL OMNIPAQUE IOHEXOL 350  MG/ML SOLN COMPARISON:  Chest x-ray from the previous day. FINDINGS: Cardiovascular: Thoracic aorta and its branches demonstrate atherosclerotic calcification without aneurysmal dilatation or dissection. No cardiac enlargement is seen. No pericardial effusion is noted. No significant coronary calcifications are noted. The pulmonary artery shows a normal branching pattern. No focal filling defect to suggest pulmonary embolism is seen. Mediastinum/Nodes: Thoracic inlet is within normal limits. No sizable hilar or mediastinal adenopathy is noted. The esophagus as visualized is within normal limits. Lungs/Pleura: Left lung is well aerated without focal infiltrate or sizable effusion. Right lung is also well aerated with small effusion  and mild right lower lobe consolidation. No sizable parenchymal nodules are noted. Upper Abdomen: Visualized upper abdomen shows no acute abnormality. Musculoskeletal: Degenerative changes of the thoracic spine are noted. Review of the MIP images confirms the above findings. IMPRESSION: No evidence of pulmonary emboli. Small right-sided pleural effusion and right basilar consolidation consistent with infiltrate. Aortic Atherosclerosis (ICD10-I70.0). Electronically Signed   By: Inez Catalina M.D.   On: 10/18/2020 21:56   DG CHEST PORT 1 VIEW  Result Date: 10/17/2020 CLINICAL DATA:  Postop fever.  Recent back surgery. EXAM: PORTABLE CHEST 1 VIEW COMPARISON:  Radiographs 10/14/2020 and 04/09/2020. FINDINGS: 1048 hours. Lower lung volumes with minimally increased atelectasis at both lung bases. No confluent airspace opacity, edema, pleural effusion or pneumothorax. The heart size and mediastinal contours are stable. The bones appear unremarkable. IMPRESSION: Minimal bibasilar atelectasis.  No focal airspace disease. Electronically Signed   By: Richardean Sale M.D.   On: 10/17/2020 13:49   VAS [place] LOWER EXTREMITY VENOUS (DVT)  Result Date: 10/18/2020  Lower Venous DVT Study Patient Name:  Megan Church  Date of Exam:   10/18/2020 Medical Rec #: [id number]          Accession #:    [id number] Date of Birth: [birth year]         Patient Gender: F Patient Age:   73Y Exam Location:  [facility] Procedure:      VAS [place] LOWER EXTREMITY VENOUS (DVT) Referring Phys: [id number] [facility] BROOKS --------------------------------------------------------------------------------  Indications: S/P ALIF 10-15-2020 with revision 10-16-2020.  Limitations: Limited patient mobility. Comparison Study: No prior studies. Performing Technologist: Darlin Coco RDMS,RVT  Examination Guidelines: A complete evaluation includes B-mode imaging, spectral Doppler, color Doppler, and power Doppler as needed of all accessible portions of each vessel.  Bilateral testing is considered an integral part of a complete examination. Limited examinations for reoccurring indications may be performed as noted. The reflux portion of the exam is performed with the patient in reverse Trendelenburg.  +---------+---------------+---------+-----------+----------+--------------+ RIGHT    CompressibilityPhasicitySpontaneityPropertiesThrombus Aging +---------+---------------+---------+-----------+----------+--------------+ CFV      Full           Yes      Yes                                 +---------+---------------+---------+-----------+----------+--------------+ SFJ      Full                                                        +---------+---------------+---------+-----------+----------+--------------+ FV Prox  Full                                                        +---------+---------------+---------+-----------+----------+--------------+  FV Mid   Full                                                        +---------+---------------+---------+-----------+----------+--------------+ FV DistalFull                                                        +---------+---------------+---------+-----------+----------+--------------+ PFV      Full                                                        +---------+---------------+---------+-----------+----------+--------------+ POP      Full           Yes      Yes                                 +---------+---------------+---------+-----------+----------+--------------+ PTV      Full                                                        +---------+---------------+---------+-----------+----------+--------------+ PERO     Full                                                        +---------+---------------+---------+-----------+----------+--------------+   +---------+---------------+---------+-----------+----------+--------------+ LEFT      CompressibilityPhasicitySpontaneityPropertiesThrombus Aging +---------+---------------+---------+-----------+----------+--------------+ CFV      Full           Yes      Yes                                 +---------+---------------+---------+-----------+----------+--------------+ SFJ      Full                                                        +---------+---------------+---------+-----------+----------+--------------+ FV Prox  Full                                                        +---------+---------------+---------+-----------+----------+--------------+ FV Mid   Full                                                        +---------+---------------+---------+-----------+----------+--------------+  FV DistalFull                                                        +---------+---------------+---------+-----------+----------+--------------+ PFV      Full                                                        +---------+---------------+---------+-----------+----------+--------------+ POP      Full           Yes      Yes                                 +---------+---------------+---------+-----------+----------+--------------+ PTV      Full                                                        +---------+---------------+---------+-----------+----------+--------------+ PERO     Full                                                        +---------+---------------+---------+-----------+----------+--------------+     Summary: RIGHT: - There is no evidence of deep vein thrombosis in the lower extremity.  - No cystic structure found in the popliteal fossa.  LEFT: - There is no evidence of deep vein thrombosis in the lower extremity.  - No cystic structure found in the popliteal fossa.  *See table(s) above for measurements and observations.    Preliminary      Medications   Scheduled Meds: . amoxicillin-clavulanate  1 tablet Oral Q12H  .  ferrous sulfate  325 mg Oral BID WC  . gabapentin  300 mg Oral TID  . insulin aspart  0-15 Units Subcutaneous TID WC  . insulin aspart  0-5 Units Subcutaneous QHS  . insulin aspart  5 Units Subcutaneous TID WC  . insulin detemir  25 Units Subcutaneous BID  . melatonin  3 mg Oral QHS  . pantoprazole  40 mg Oral Daily  . rOPINIRole  3 mg Oral QHS  . rosuvastatin  20 mg Oral QHS  . senna-docusate  1 tablet Oral BID  . sodium chloride flush  3 mL Intravenous Q12H  . thyroid  90 mg Oral QAC breakfast   Continuous Infusions: . sodium chloride    . methocarbamol (ROBAXIN) IV         LOS: 4 days    Time spent: 25 minutes with >50% spent at bedside and in coordination of care.    Ezekiel Slocumb, DO [facility]  10/19/2020, 9:13 AM      If 7PM-7AM, please contact night-coverage. How to contact the [facility] Attending or Consulting provider [place] or covering provider during after hours [place], for this patient?    1. Check the care team in [facility]  and look for a) attending/consulting [place] provider listed and b) the [facility] team listed 2. Log into [email or web address] and use [place]'s universal password to access. If you do not have the password, please contact the hospital operator. 3. Locate the [facility] provider you are looking for under [facility] and page to a number that you can be directly reached. 4. If you still have difficulty reaching the provider, please page the [facility] (Director on Call) for the Hospitalists listed on amion for assistance.

## 2020-10-19 NOTE — Progress Notes (Signed)
Subjective: 3 Days Post-Op Procedure(s) (LRB): Revision decompression L2-3, L3-4 (N/A) L2-L5 Fusion (N/A)  Patient reports pain as mild to moderate.   Resting comfortably in chair.  Denies fever, chills, N/V, CP, SOB. Admits to flatus.  Objective:   VITALS:  Temp:  [97.9 F (36.6 C)-99.3 F (37.4 C)] 97.9 F (36.6 C) (05/08 0410) Pulse Rate:  [83-99] 83 (05/08 0410) Resp:  [15-20] 15 (05/08 0410) BP: (91-126)/(43-75) 100/54 (05/08 0410) SpO2:  [90 %-99 %] 99 % (05/08 0410)  General: WDWN patient in NAD. Psych:  Appropriate mood and affect. Neuro:  A&O x 3, Moving all extremities, sensation intact to light touch HEENT:  EOMs intact Chest:  Even non-labored respirations Skin: Dressing C/D/I, no rashes or lesions.  Drain with small amount of serosanginous drainage Extremities: warm/dry, no edema, erythema or echymosis.  No lymphadenopathy. Pulses: Popliteus 2+ MSK:  ROM: TKE, MMT: able to perform quad set    LABS Recent Labs    10/17/20 1029 10/18/20 0212 10/19/20 0217  HGB 9.1* 8.7* 8.7*  WBC 6.8 6.6 6.5  PLT 123* 126* 155   Recent Labs    10/18/20 0212 10/19/20 0217  NA 136 136  K 3.4* 3.6  CL 102 101  CO2 28 28  BUN 5* 5*  CREATININE 0.66 0.65  GLUCOSE 226* 189*   Recent Labs    10/17/20 1029  INR 1.3*     Assessment/Plan: 3 Days Post-Op Procedure(s) (LRB): Revision decompression L2-3, L3-4 (N/A) L2-L5 Fusion (N/A)  Patient seen in rounds for Dr. Rolena Infante Up with therapy DVT ppx:  SCDs, TED, ambulation Drain to remain until routinely below 30 cc per shift Following blood cultures D/C once cleared by Hospitalist service  Mechele Claude PA-C [facility] Office:  [phone]

## 2020-10-19 NOTE — Progress Notes (Signed)
Physical Therapy Treatment Patient Details Name: Megan Church MRN: [id number] DOB: Aug 21, 1955 Today's Date: 10/19/2020    History of Present Illness Pt is a 65 y/o female who presents s/p L2-L4 ALIF on 10/16/2020. PMH significant for DM type II, shingles, RLS, hypothyroidism, skin CA, R TKR, prior back surgery in 2012 and 2016.    PT Comments    Pt progressing towards her physical therapy goals; reports continued right thigh numbness. Ambulating x 200 feet with no assistive device and negotiated a step without physical assist. SpO2 95% on RA, HR peak 122 bpm. Education provided regarding spinal precautions, brace use, exercise recommendations, and endurance strategies.     Follow Up Recommendations  No PT follow up;Supervision for mobility/OOB     Equipment Recommendations  None recommended by PT    Recommendations for Other Services       Precautions / Restrictions Precautions Precautions: Fall;Back Precaution Booklet Issued: No Precaution Comments: Reviewed precautions Required Braces or Orthoses: Spinal Brace Spinal Brace: Lumbar corset;Applied in standing position Restrictions Weight Bearing Restrictions: No    Mobility  Bed Mobility Overal bed mobility: Modified Independent             General bed mobility comments: HOB flat, use of rail    Transfers Overall transfer level: Independent Equipment used: None                Ambulation/Gait Ambulation/Gait assistance: Supervision Gait Distance (Feet): 200 Feet Assistive device: None Gait Pattern/deviations: Step-through pattern;Decreased stride length;Trunk flexed     General Gait Details: Decreased reciprocal arm swing, slow and steady pace.   Stairs Stairs: Yes Stairs assistance: Supervision Stair Management: One rail Left Number of Stairs: 1 General stair comments: Step by step technique   Wheelchair Mobility    Modified Rankin (Stroke Patients Only)       Balance Overall  balance assessment: Needs assistance Sitting-balance support: Feet supported;No upper extremity supported Sitting balance-Leahy Scale: Good     Standing balance support: No upper extremity supported;During functional activity Standing balance-Leahy Scale: Good                              Cognition Arousal/Alertness: Awake/alert Behavior During Therapy: WFL for tasks assessed/performed Overall Cognitive Status: Within Functional Limits for tasks assessed                                        Exercises      General Comments        Pertinent Vitals/Pain Pain Assessment: Faces Faces Pain Scale: Hurts a little bit Pain Location: Incision site Pain Descriptors / Indicators: Operative site guarding;Discomfort Pain Intervention(s): Monitored during session    Home Living                      Prior Function            PT Goals (current goals can now be found in the care plan section) Acute Rehab PT Goals Patient Stated Goal: Full recovery. PT Goal Formulation: With patient Time For Goal Achievement: 10/24/20 Potential to Achieve Goals: Good Progress towards PT goals: Progressing toward goals    Frequency    Min 5X/week      PT Plan Current plan remains appropriate    Co-evaluation  AM-PAC PT "6 Clicks" Mobility   Outcome Measure  Help needed turning from your back to your side while in a flat bed without using bedrails?: None Help needed moving from lying on your back to sitting on the side of a flat bed without using bedrails?: None Help needed moving to and from a bed to a chair (including a wheelchair)?: None Help needed standing up from a chair using your arms (e.g., wheelchair or bedside chair)?: None Help needed to walk in hospital room?: A Little Help needed climbing 3-5 steps with a railing? : A Little 6 Click Score: 22    End of Session Equipment Utilized During Treatment: Gait belt Activity  Tolerance: Patient tolerated treatment well Patient left: with call bell/phone within reach;in bed Nurse Communication: Mobility status PT Visit Diagnosis: Unsteadiness on feet (R26.81);Pain     Time: 1110-1130 PT Time Calculation (min) (ACUTE ONLY): 20 min  Charges:  $Therapeutic Activity: 8-22 mins                     Wyona Almas, PT, DPT Acute Rehabilitation Services Pager [phone] Office Regino Ramirez 10/19/2020, 3:50 PM

## 2020-10-19 NOTE — Discharge Summary (Signed)
Physician Discharge Summary  Patient ID: Megan Church MRN: [id number] DOB/AGE: [birth year] 65 y.o.  Admit date: 10/15/2020 Discharge date: 10/19/2020  Admission Diagnoses:  Lumbar OA; uncontrolled DM type II, hyperlipidemia, sinus tachycardia, hx of R TKA, colonic polyps, hypothyroidism, Vit D deficiency, and GERD  Discharge Diagnoses:  Active Problems:   Type II diabetes mellitus, uncontrolled (HCC)   Morbid obesity (HCC)   HLD (hyperlipidemia)   S/P lumbar fusion   Sepsis ([place])   Sinus tachycardia Same as above  Discharged Condition: stable  Hospital Course: Patient presented to [facility] for elective Revision decompression L2-3, L3-4, and L2-L5 fusion by Dr. Rolena Infante.  The patient tolerated the procedure well and was admitted to the hospital.  She developed post-op fever and hypotension after admission.  The hospitalist service was consulted. She was placed on empiric broad spectrum antibiotics initially.  Blood cultures have been negative thus far.  UA was negative.  CTA demonstrates RLL infiltrate consistent with pneumonia/pneumonitis.  Hospitalists reports that patient is stable and ready for D/C and recommend 5 days of PO Augmentin.  Patient is D/C'd home on 10/19/20.  Consults: Hospitalists  Significant Diagnostic Studies: radiology: CT scan: CTA demonstrates RLL infiltrate consistent with pneumonia/pneumonitis.  Treatments: IV hydration, antibiotics: Ancef, cefepime, and augmentin, analgesia: acetaminophen, Morphine and oxycodone, insulin: Humalog, respiratory therapy: O2 and surgery: as stated above  Discharge Exam: Blood pressure 137/73, pulse 93, temperature 98.8 F (37.1 C), temperature source Oral, resp. rate 20, height 5' 3.25" (1.607 m), weight 91.2 kg, SpO2 94 %. General: WDWN patient in NAD. Psych:  Appropriate mood and affect. Neuro:  A&O x 3, Moving all extremities, sensation intact to light touch HEENT:  EOMs intact Chest:  Even non-labored  respirations Skin:  Dressing C/D/I, no rashes or lesions.  Drain with small amount of serosanguinous fluid Extremities: warm/dry, no edema, erythema or echymosis.  No lymphadenopathy. Pulses: Femoral 2+ MSK:  ROM: TKE, MMT: able to perform quad set   Disposition: Discharge disposition: 01-Home or Self Care       Discharge Instructions    Call MD / Call 911   Complete by: As directed    If you experience chest pain or shortness of breath, CALL 911 and be transported to the hospital emergency room.  If you develope a fever above 101 F, pus (white drainage) or increased drainage or redness at the wound, or calf pain, call your surgeon's office.   Constipation Prevention   Complete by: As directed    Drink plenty of fluids.  Prune juice may be helpful.  You may use a stool softener, such as Colace (over the counter) 100 mg twice a day.  Use MiraLax (over the counter) for constipation as needed.   Diet - low sodium heart healthy   Complete by: As directed    Incentive spirometry RT   Complete by: As directed    Incentive spirometry RT   Complete by: As directed    Increase activity slowly as tolerated   Complete by: As directed    Post-operative opioid taper instructions:   Complete by: As directed    POST-OPERATIVE OPIOID TAPER INSTRUCTIONS: It is important to wean off of your opioid medication as soon as possible. If you do not need pain medication after your surgery it is ok to stop day one. Opioids include: Codeine, Hydrocodone(Norco, Vicodin), Oxycodone(Percocet, oxycontin) and hydromorphone amongst others.  Long term and even short term use of opiods can cause: Increased pain response Dependence Constipation Depression  Respiratory depression And more.  Withdrawal symptoms can include Flu like symptoms Nausea, vomiting And more Techniques to manage these symptoms Hydrate well Eat regular healthy meals Stay active Use relaxation techniques(deep breathing, meditating,  yoga) Do Not substitute Alcohol to help with tapering If you have been on opioids for less than two weeks and do not have pain than it is ok to stop all together.  Plan to wean off of opioids This plan should start within one week post op of your joint replacement. Maintain the same interval or time between taking each dose and first decrease the dose.  Cut the total daily intake of opioids by one tablet each day Next start to increase the time between doses. The last dose that should be eliminated is the evening dose.        Allergies as of 10/19/2020      Reactions   Sulfa Antibiotics Hives   Ace Inhibitors Cough   Invokana [canagliflozin] Other (See Comments)   Yeast    Vytorin [ezetimibe-simvastatin] Other (See Comments)   Leg cramps   Atorvastatin Hives      Medication List    STOP taking these medications   HYDROcodone-acetaminophen 5-325 MG tablet Commonly known as: NORCO/VICODIN   Magnesium 500 MG Tabs   vitamin B-12 1000 MCG tablet Commonly known as: CYANOCOBALAMIN     TAKE these medications   amoxicillin-clavulanate 875-125 MG tablet Commonly known as: AUGMENTIN Take 1 tablet by mouth every 12 (twelve) hours for 5 days.   calcium carbonate 1500 (600 Ca) MG Tabs tablet Commonly known as: OSCAL Take 600 mg of elemental calcium by mouth daily with breakfast.   First-Testosterone MC 2 % Crea Place 5 mg onto the skin daily.   FreeStyle Libre 2 Reader Shepherd 1 each by Does not apply route daily.   FreeStyle Libre 2 Sensor Misc 1 each by Does not apply route every 14 (fourteen) days.   furosemide 40 MG tablet Commonly known as: LASIX Take 1 tablet (40 mg total) by mouth daily. What changed:   when to take this  reasons to take this   Insulin Pen Needle 32G X 4 MM Misc 1 each by Does not apply route at bedtime.   Levemir FlexTouch 100 UNIT/ML FlexPen Generic drug: insulin detemir Inject 50 Units into the skin at bedtime. What changed: how much to  take   Lyumjev KwikPen 100 UNIT/ML Sopn Generic drug: Insulin Lispro-aabc (1 U Dial) Inject 8-10 units before meals under skin   melatonin 3 MG Tabs tablet Take 3 mg by mouth at bedtime.   metFORMIN 1000 MG tablet Commonly known as: GLUCOPHAGE Take 1 tablet (1,000 mg total) by mouth 2 (two) times daily.   methocarbamol 500 MG tablet Commonly known as: Robaxin Take 1 tablet (500 mg total) by mouth every 8 (eight) hours as needed for up to 5 days for muscle spasms.   NP Thyroid 90 MG tablet Generic drug: thyroid Take 90 mg by mouth daily.   omeprazole 20 MG capsule Commonly known as: PRILOSEC Take 1 capsule (20 mg total) by mouth every other day.   ondansetron 4 MG tablet Commonly known as: Zofran Take 1 tablet (4 mg total) by mouth every 8 (eight) hours as needed for nausea or vomiting.   oxyCODONE-acetaminophen 10-325 MG tablet Commonly known as: Percocet Take 1 tablet by mouth every 6 (six) hours as needed for up to 5 days for pain.   rOPINIRole 3 MG tablet Commonly known as: REQUIP Take  1 tablet (3 mg total) by mouth at bedtime.   rosuvastatin 20 MG tablet Commonly known as: CRESTOR Take 1 tablet (20 mg total) by mouth daily.   SYSTANE FREE OP Place 1 drop into both eyes every 4 (four) hours as needed (Dry eyes).   Trulicity 3 IF/0.2DX Sopn Generic drug: Dulaglutide Inject 3 mg into the skin once a week.   Vitamin D-3 125 MCG (5000 UT) Tabs Take 10,000 Units by mouth daily at 12 noon.       Follow-up Information    Melina Schools, MD. Schedule an appointment as soon as possible for a visit in 2 weeks.   Specialty: Orthopedic Surgery Why: If symptoms worsen, For suture removal, For wound re-check Contact information: [address] [place] [address] [phone]               Signed: Mohammed Kindle Office:  [phone]

## 2020-10-20 ENCOUNTER — Encounter (HOSPITAL_COMMUNITY): Payer: Self-pay | Admitting: Orthopedic Surgery

## 2020-10-22 ENCOUNTER — Telehealth (INDEPENDENT_AMBULATORY_CARE_PROVIDER_SITE_OTHER): Payer: Self-pay

## 2020-10-22 LAB — CULTURE, BLOOD (ROUTINE X 2)
Culture: NO GROWTH
Culture: NO GROWTH
Special Requests: ADEQUATE

## 2020-10-22 NOTE — Telephone Encounter (Signed)
.  Transition Care Management Follow-up Telephone Call Date of discharge and from where:10/19/2020 @ 3:01pm to home. How have you been since you were released from the hospital? Megan Church, okay. Getting better.  Any questions or concerns? Yes, having some issues to go bathroom to eliminate. Cut back on pain medications. Taken Stool softener, Murelax, eating more vegetables dtr is cooking.  Starting to sleep better, body adjusting.  Items Reviewed: Did the pt receive and understand the discharge instructions provided?Yes, but had to call dr Books to get more instructions. Rt leg begin getting more numbness. So she notified his office.   Medications obtained and verified? Yes  Other? no Any new allergies since your discharge? No  Dietary orders reviewed? Yes, not to start bak on vitamins yet, for the concerns of the liver test right now.  Do you have support at home? Yes        My Dtr, Spouse.  Home Care and Equipment/Supplies: Were home health services ordered? No, already had everything setup . If so, what is the name of the agency? n/a  Has the agency set up a time to come to the patient's home? not applicable Were any new equipment or medical supplies ordered?  No What is the name of the medical supply agency? N/a  Were you able to get the supplies/equipment? not applicable Do you have any questions related to the use of the equipment or supplies? No  Functional Questionnaire: (I = Independent and D = Dependent) ADLs:  I Walking on my own now.   Bathing/Dressing- I  Meal Prep- D, Dtr & spouse.  Eating- I  Maintaining continence- I  Transferring/Ambulation- I  Managing Meds- I  Follow up appointments reviewed:  PCP Hospital f/u appt confirmed? Yes  Scheduled to see Dr Renea Ee on May 16,2022@ AM. [facility] f/u appt confirmed? Yes   Are transportation arrangements needed? No  If their condition worsens, is the pt aware to call PCP or go to the Emergency Dept.? Yes Was the  patient provided with contact information for the PCP's office or ED? Yes Was to pt encouraged to call back with questions or concerns? No

## 2020-11-03 ENCOUNTER — Other Ambulatory Visit (HOSPITAL_COMMUNITY): Payer: Self-pay | Admitting: Internal Medicine

## 2020-11-03 DIAGNOSIS — Z1231 Encounter for screening mammogram for malignant neoplasm of breast: Secondary | ICD-10-CM

## 2020-11-13 ENCOUNTER — Other Ambulatory Visit (INDEPENDENT_AMBULATORY_CARE_PROVIDER_SITE_OTHER): Payer: Self-pay | Admitting: Internal Medicine

## 2020-11-13 DIAGNOSIS — E119 Type 2 diabetes mellitus without complications: Secondary | ICD-10-CM

## 2020-11-17 ENCOUNTER — Encounter (INDEPENDENT_AMBULATORY_CARE_PROVIDER_SITE_OTHER): Payer: Self-pay | Admitting: Internal Medicine

## 2020-11-19 ENCOUNTER — Other Ambulatory Visit: Payer: Self-pay | Admitting: Orthopedic Surgery

## 2020-11-19 ENCOUNTER — Ambulatory Visit
Admission: RE | Admit: 2020-11-19 | Discharge: 2020-11-19 | Disposition: A | Discharge status: Home / Self Care | Payer: Medicare Other | Source: Ambulatory Visit | Attending: Orthopedic Surgery | Admitting: Orthopedic Surgery

## 2020-11-19 DIAGNOSIS — R0781 Pleurodynia: Secondary | ICD-10-CM

## 2020-11-26 ENCOUNTER — Ambulatory Visit (INDEPENDENT_AMBULATORY_CARE_PROVIDER_SITE_OTHER): Payer: Medicare Other | Admitting: Nurse Practitioner

## 2020-11-26 ENCOUNTER — Telehealth (INDEPENDENT_AMBULATORY_CARE_PROVIDER_SITE_OTHER): Payer: Self-pay | Admitting: Nurse Practitioner

## 2020-11-26 ENCOUNTER — Other Ambulatory Visit: Payer: Self-pay

## 2020-11-26 ENCOUNTER — Encounter (INDEPENDENT_AMBULATORY_CARE_PROVIDER_SITE_OTHER): Payer: Self-pay | Admitting: Nurse Practitioner

## 2020-11-26 VITALS — BP 120/70 | HR 84 | Temp 97.0°F | Ht 62.75 in | Wt 199.4 lb

## 2020-11-26 DIAGNOSIS — E039 Hypothyroidism, unspecified: Secondary | ICD-10-CM

## 2020-11-26 DIAGNOSIS — E559 Vitamin D deficiency, unspecified: Secondary | ICD-10-CM

## 2020-11-26 DIAGNOSIS — H00012 Hordeolum externum right lower eyelid: Secondary | ICD-10-CM

## 2020-11-26 DIAGNOSIS — D649 Anemia, unspecified: Secondary | ICD-10-CM | POA: Diagnosis not present

## 2020-11-26 DIAGNOSIS — E1165 Type 2 diabetes mellitus with hyperglycemia: Secondary | ICD-10-CM | POA: Diagnosis not present

## 2020-11-26 DIAGNOSIS — R1011 Right upper quadrant pain: Secondary | ICD-10-CM

## 2020-11-26 MED ORDER — NP THYROID 90 MG PO TABS: 90.0000 mg | ORAL_TABLET | Freq: Every day | ORAL | 1 refills | Status: DC

## 2020-11-26 NOTE — Patient Instructions (Signed)
Vestibular physical therapy for vertigo

## 2020-11-26 NOTE — Progress Notes (Signed)
Subjective:  Patient ID: Megan Church, female    DOB: 07-14-1955  Age: 65 y.o. MRN: [id number]  CC:  Chief Complaint  Patient presents with   Follow-up    Doing better since her back surgery, still recovering, having pain in right upper rib area and patient does not have her gallbladder anymore, sensitive to touch and hurts more when she lays on her left side   Abdominal Pain   Diabetes   Other    Vitamin D deficiency, stye   Hypothyroidism   Anemia      HPI  This patient arrives today for the above.  Abdominal pain: She is been experiencing right upper quadrant tenderness that has been going on for approximately 6 weeks.  She tells me she started noticing it after she had back surgery.  She is wondering if it was because she was in an unnatural position during the surgery.  She tells me she does not experience any other abdominal symptoms such as diarrhea, constipation, blood in her stool, nausea, vomiting.  The pain is 4/10 and only occurs if she touches the area or lays on her side.  The pain does not radiate anywhere.  She does not have a gallbladder.  She has not noticed any other triggering factors.  Diabetes: She follows with endocrinology on a regular basis for treatment of her diabetes.  Tells me at home her blood sugars may be running well on her glucometer.  She is not sure if she is have her urine tested for microalbuminuria.  She does have a cough when she has tried ACE inhibitor's in the past.  Last A1c was 8.5 and this was collected about a month ago.  Vitamin D deficiency: She continues on vitamin D3 supplement and is due to have serum level checked today.  Stye: She mentions she had a bump to her right lower eyelid.  She was using warm compresses and the bump is improved.  It was tender initially, but seems to be improving.  She thinks this was a stye but wanted to have this evaluated today.  Hypothyroidism: She continues on NP thyroid and was requesting a  refill today.  She does mention some hot flashes but otherwise she is tolerating medication well.    Anemia: Following her surgery about 6 weeks ago she was a bit anemic in the hospital.  She is on iron supplement.  She went from taking it twice a day down to once a day.  Past Medical History:  Diagnosis Date   Anemia    Anxiety    Arthritis    Cancer ([place])    basal cell carcinoma right arm and nose spots removed   Depression    GERD (gastroesophageal reflux disease)    HLD (hyperlipidemia) 02/20/2019   Morbid obesity ([place]) 02/20/2019   Primary hypothyroidism 02/20/2019   RLS (restless legs syndrome)    Shingles    Sleep apnea    Type II diabetes mellitus, uncontrolled ([place]) 02/20/2019   Vitamin D deficiency disease 02/20/2019      Family History  Problem Relation Age of Onset   Cancer Mother    Heart disease Father    Stroke Father    Cancer Father    Heart disease Brother    Hyperlipidemia Brother    Hyperlipidemia Brother    Colon cancer Neg Hx     Social History   Social History Narrative   Married for 42 years.Moved from [place] in December  as husband found new job.Used to be a librarian,retired.Husband a Research scientist (life sciences) from [place]-now retired.   Social History   Tobacco Use   Smoking status: Former    Packs/day: 1.00    Years: 8.00    Pack years: 8.00    Types: Cigarettes    Start date: 06/14/1970    Quit date: 06/14/1978    Years since quitting: 42.4   Smokeless tobacco: Never   Tobacco comments:    quit age 89  Substance Use Topics   Alcohol use: Yes    Alcohol/week: 14.0 standard drinks    Types: 7 Glasses of wine, 7 Shots of liquor per week     Current Meds  Medication Sig   Calcium Carbonate-Vitamin D 600-200 MG-UNIT TABS Take by mouth.   Cholecalciferol (VITAMIN D-3) 125 MCG (5000 UT) TABS Take 10,000 Units by mouth daily at 12 noon.   Cyanocobalamin 1000 MCG TBCR Take by mouth.   Dulaglutide (TRULICITY) 3 MG/0.5ML SOPN Inject 3 mg into the  skin once a week.   furosemide (LASIX) 40 MG tablet Take 1 tablet (40 mg total) by mouth daily. (Patient taking differently: Take 40 mg by mouth daily as needed for edema.)   HYDROcodone-acetaminophen (NORCO/VICODIN) 5-325 MG tablet Take 1 tablet by mouth daily as needed.   Insulin Lispro-aabc, 1 U Dial, (LYUMJEV KWIKPEN) 100 UNIT/ML SOPN Inject 8-10 units before meals under skin   Insulin Pen Needle 32G X 4 MM MISC 1 each by Does not apply route at bedtime.   LEVEMIR FLEXTOUCH 100 UNIT/ML FlexPen INJECT 50 UNITS UNDER THE SKIN AT BEDTIME (Patient taking differently: Inject 55 Units into the skin at bedtime.)   Magnesium 500 MG CAPS Take by mouth.   melatonin 3 MG TABS tablet Take 3 mg by mouth at bedtime.   metFORMIN (GLUCOPHAGE) 1000 MG tablet TAKE 1 TABLET TWICE A DAY   omeprazole (PRILOSEC) 20 MG capsule Take 1 capsule (20 mg total) by mouth every other day.   Polyethyl Glycol-Propyl Glycol (SYSTANE FREE OP) Place 1 drop into both eyes every 4 (four) hours as needed (Dry eyes).   rOPINIRole (REQUIP) 3 MG tablet Take 1 tablet (3 mg total) by mouth at bedtime.   rosuvastatin (CRESTOR) 20 MG tablet Take 1 tablet (20 mg total) by mouth daily.   Testosterone Propionate (FIRST-TESTOSTERONE MC) 2 % CREA Place 5 mg onto the skin daily.   [DISCONTINUED] NP THYROID 90 MG tablet Take 90 mg by mouth daily.    ROS:  Review of Systems  Respiratory:  Negative for shortness of breath.   Cardiovascular:  Negative for chest pain and palpitations.  Gastrointestinal:  Positive for abdominal pain. Negative for blood in stool, constipation, diarrhea, melena (dark stools but may be due to iron supplment), nausea and vomiting.    Objective:   Today's Vitals: BP 120/70   Pulse 84   Temp (!) 97 F (36.1 C) (Temporal)   Ht 5' 2.75" (1.594 m)   Wt 199 lb 6.4 oz (90.4 kg)   SpO2 98%   BMI 35.60 kg/m  Vitals with BMI 11/26/2020 10/19/2020 10/19/2020  Height 5' 2.75" - -  Weight 199 lbs 6 oz - -  BMI 35.6 - -   Systolic 120 121 398  Diastolic 70 62 73  Pulse 84 91 93     Physical Exam Vitals reviewed.  Constitutional:      General: She is not in acute distress.    Appearance: Normal appearance.  HENT:  Head: Normocephalic and atraumatic.  Neck:     Vascular: No carotid bruit.  Cardiovascular:     Rate and Rhythm: Normal rate and regular rhythm.     Pulses: Normal pulses.     Heart sounds: Normal heart sounds.  Pulmonary:     Effort: Pulmonary effort is normal.     Breath sounds: Normal breath sounds.  Abdominal:     General: Abdomen is flat. A surgical scar is present. Bowel sounds are normal. There is no abdominal bruit.     Palpations: Abdomen is soft. There is no hepatomegaly, splenomegaly or mass.     Tenderness: There is abdominal tenderness (To light palpation over lower rib cage/RUQ). Negative signs include Murphy's sign.  Skin:    General: Skin is warm and dry.  Neurological:     General: No focal deficit present.     Mental Status: She is alert and oriented to person, place, and time.  Psychiatric:        Mood and Affect: Mood normal.        Behavior: Behavior normal.        Judgment: Judgment normal.         Assessment and Plan   1. Primary hypothyroidism   2. Uncontrolled type 2 diabetes mellitus with hyperglycemia ([place])   3. Anemia, unspecified type   4. Vitamin D deficiency disease   5. RUQ abdominal pain   6. Hordeolum externum of right lower eyelid      Plan: 1.  I will refill her NP thyroid per her request.  She tells me she will follow-up with her endocrinologist soon at which point she prefer to have her thyroid blood work done by them.  We will hold off on ordering thyroid panel today for this reason. 2.  She will follow-up with endocrinology for management of her diabetes overall, however I will check urine for microalbuminuria as she is not sure if this has been tested before.  We did have a conversation that we may consider adding losartan if  she has albuminuria, however she has been made aware that if she experiences a cough on losartan she needs to stop this medication.  She tells me she understands. 3.  We will check CBC, ferritin, and iron levels for further evaluation. 4.  We will check vitamin D level as well as CMP. 5.  Unsure of current etiology, I am wondering if may be she has a pinched nerve that could be causing the pain.  However we will get abdominal ultrasound for further evaluation before making further decisions regarding treatment.  Likely pain is very mild currently, and she was encouraged let me know if symptoms worsen. 6.  This seems to be improving and I encouraged her to continue using warm compress.   Tests ordered Orders Placed This Encounter  Procedures   US Abdomen Complete   CBC   Iron   Ferritin   Vitamin D, 25-hydroxy   CMP with eGFR([facility])   Microalbumin/Creatinine Ratio, Urine      Meds ordered this encounter  Medications   NP THYROID 90 MG tablet    Sig: Take 1 tablet (90 mg total) by mouth daily.    Dispense:  90 tablet    Refill:  1    Order Specific Question:   Supervising Provider    Answer:   [name] [[id number]]    Patient to follow-up in 3 months or sooner as needed and/or based on testing results.  [facility]  Carmie Kanner, NP

## 2020-11-26 NOTE — Telephone Encounter (Signed)
Ordered US of abdomen today for patient.

## 2020-11-26 NOTE — Telephone Encounter (Signed)
Ok will work on it.

## 2020-11-27 LAB — CBC
HCT: 36.2 % (ref 35.0–45.0)
Hemoglobin: 11.5 g/dL — ABNORMAL LOW (ref 11.7–15.5)
MCH: 29.3 pg (ref 27.0–33.0)
MCHC: 31.8 g/dL — ABNORMAL LOW (ref 32.0–36.0)
MCV: 92.1 fL (ref 80.0–100.0)
MPV: 9.9 fL (ref 7.5–12.5)
Platelets: 179 10*3/uL (ref 140–400)
RBC: 3.93 10*6/uL (ref 3.80–5.10)
RDW: 13.3 % (ref 11.0–15.0)
WBC: 4.9 10*3/uL (ref 3.8–10.8)

## 2020-11-27 LAB — IRON: Iron: 68 ug/dL (ref 45–160)

## 2020-11-27 LAB — COMPLETE METABOLIC PANEL WITH GFR
AG Ratio: 1.5 (calc) (ref 1.0–2.5)
ALT: 33 U/L — ABNORMAL HIGH (ref 6–29)
AST: 49 U/L — ABNORMAL HIGH (ref 10–35)
Albumin: 4.1 g/dL (ref 3.6–5.1)
Alkaline phosphatase (APISO): 155 U/L — ABNORMAL HIGH (ref 37–153)
BUN: 10 mg/dL (ref 7–25)
CO2: 29 mmol/L (ref 20–32)
Calcium: 9.6 mg/dL (ref 8.6–10.4)
Chloride: 104 mmol/L (ref 98–110)
Creat: 0.54 mg/dL (ref 0.50–0.99)
GFR, Est African American: 115 mL/min/{1.73_m2} (ref >=60)
GFR, Est Non African American: 99 mL/min/{1.73_m2} (ref >=60)
Globulin: 2.7 g/dL (calc) (ref 1.9–3.7)
Glucose, Bld: 173 mg/dL — ABNORMAL HIGH (ref 65–99)
Potassium: 4 mmol/L (ref 3.5–5.3)
Sodium: 141 mmol/L (ref 135–146)
Total Bilirubin: 0.5 mg/dL (ref 0.2–1.2)
Total Protein: 6.8 g/dL (ref 6.1–8.1)

## 2020-11-27 LAB — FERRITIN: Ferritin: 65 ng/mL (ref 16–288)

## 2020-11-27 LAB — MICROALBUMIN / CREATININE URINE RATIO
Creatinine, Urine: 224 mg/dL (ref 20–275)
Microalb Creat Ratio: 21 mcg/mg creat (ref <30)
Microalb, Ur: 4.8 mg/dL

## 2020-11-27 LAB — VITAMIN D 25 HYDROXY (VIT D DEFICIENCY, FRACTURES): Vit D, 25-Hydroxy: 87 ng/mL (ref 30–100)

## 2020-12-03 ENCOUNTER — Ambulatory Visit (HOSPITAL_COMMUNITY)
Admission: RE | Admit: 2020-12-03 | Discharge: 2020-12-03 | Disposition: A | Discharge status: Home / Self Care | Payer: Medicare Other | Source: Ambulatory Visit | Attending: Nurse Practitioner | Admitting: Nurse Practitioner

## 2020-12-03 ENCOUNTER — Other Ambulatory Visit: Payer: Self-pay

## 2020-12-03 DIAGNOSIS — R1011 Right upper quadrant pain: Secondary | ICD-10-CM | POA: Insufficient documentation

## 2020-12-04 ENCOUNTER — Other Ambulatory Visit (INDEPENDENT_AMBULATORY_CARE_PROVIDER_SITE_OTHER): Payer: Self-pay | Admitting: Nurse Practitioner

## 2020-12-04 DIAGNOSIS — R932 Abnormal findings on diagnostic imaging of liver and biliary tract: Secondary | ICD-10-CM

## 2020-12-04 DIAGNOSIS — R1011 Right upper quadrant pain: Secondary | ICD-10-CM

## 2020-12-04 NOTE — Progress Notes (Signed)
Ordering referral to gastroenterology for further evaluation of her right upper quadrant pain as well as for abnormal findings of her liver on recent ultrasound.

## 2020-12-05 ENCOUNTER — Other Ambulatory Visit: Payer: Self-pay | Admitting: Nurse Practitioner

## 2020-12-05 ENCOUNTER — Encounter (INDEPENDENT_AMBULATORY_CARE_PROVIDER_SITE_OTHER): Payer: Self-pay | Admitting: Nurse Practitioner

## 2020-12-05 ENCOUNTER — Other Ambulatory Visit (INDEPENDENT_AMBULATORY_CARE_PROVIDER_SITE_OTHER): Payer: Self-pay | Admitting: Internal Medicine

## 2020-12-05 ENCOUNTER — Other Ambulatory Visit (HOSPITAL_COMMUNITY): Payer: Self-pay | Admitting: Nurse Practitioner

## 2020-12-05 ENCOUNTER — Other Ambulatory Visit: Payer: Self-pay | Admitting: Oncology

## 2020-12-05 DIAGNOSIS — R1011 Right upper quadrant pain: Secondary | ICD-10-CM

## 2020-12-08 ENCOUNTER — Other Ambulatory Visit (INDEPENDENT_AMBULATORY_CARE_PROVIDER_SITE_OTHER): Payer: Self-pay | Admitting: Internal Medicine

## 2020-12-08 ENCOUNTER — Encounter: Payer: Self-pay | Admitting: Internal Medicine

## 2020-12-08 ENCOUNTER — Other Ambulatory Visit (INDEPENDENT_AMBULATORY_CARE_PROVIDER_SITE_OTHER): Payer: Self-pay | Admitting: Nurse Practitioner

## 2020-12-08 ENCOUNTER — Encounter (INDEPENDENT_AMBULATORY_CARE_PROVIDER_SITE_OTHER): Payer: Self-pay | Admitting: Nurse Practitioner

## 2020-12-08 ENCOUNTER — Encounter (INDEPENDENT_AMBULATORY_CARE_PROVIDER_SITE_OTHER): Payer: Self-pay | Admitting: Internal Medicine

## 2020-12-08 DIAGNOSIS — E119 Type 2 diabetes mellitus without complications: Secondary | ICD-10-CM

## 2020-12-08 NOTE — Telephone Encounter (Signed)
I have no idea as well. But it was approved. I think it is for the GI pain she is having.

## 2020-12-09 ENCOUNTER — Telehealth (INDEPENDENT_AMBULATORY_CARE_PROVIDER_SITE_OTHER): Payer: Self-pay | Admitting: Nurse Practitioner

## 2020-12-09 NOTE — Telephone Encounter (Signed)
You can cancel her second ultrasound. I think there was a mistake regarding having both scheduled. But she should not have to do the second one.

## 2020-12-09 NOTE — Telephone Encounter (Signed)
Yeas will send to central scheduling.

## 2020-12-11 ENCOUNTER — Telehealth (INDEPENDENT_AMBULATORY_CARE_PROVIDER_SITE_OTHER): Payer: Self-pay

## 2020-12-11 NOTE — Telephone Encounter (Signed)
[facility] scheduling called and wanted to know how to proceed with [place] order scheduled for 12/12/2020. Per [name] this should have been canceled. I called them back at [phone] and canceled this repeat US.

## 2020-12-12 ENCOUNTER — Ambulatory Visit (HOSPITAL_COMMUNITY): Admission: RE | Admit: 2020-12-12 | Payer: Medicare Other | Source: Ambulatory Visit

## 2020-12-12 ENCOUNTER — Other Ambulatory Visit: Payer: Self-pay

## 2020-12-12 ENCOUNTER — Ambulatory Visit (HOSPITAL_COMMUNITY)
Admission: RE | Admit: 2020-12-12 | Discharge: 2020-12-12 | Disposition: A | Discharge status: Home / Self Care | Payer: Medicare Other | Source: Ambulatory Visit | Attending: Internal Medicine | Admitting: Internal Medicine

## 2020-12-12 ENCOUNTER — Encounter (HOSPITAL_COMMUNITY): Payer: Self-pay

## 2020-12-12 DIAGNOSIS — Z1231 Encounter for screening mammogram for malignant neoplasm of breast: Secondary | ICD-10-CM | POA: Diagnosis present

## 2020-12-31 ENCOUNTER — Other Ambulatory Visit: Payer: Self-pay

## 2020-12-31 ENCOUNTER — Encounter: Payer: Self-pay | Admitting: Internal Medicine

## 2020-12-31 ENCOUNTER — Ambulatory Visit (INDEPENDENT_AMBULATORY_CARE_PROVIDER_SITE_OTHER): Payer: Medicare Other | Admitting: Internal Medicine

## 2020-12-31 VITALS — BP 120/80 | HR 83 | Ht 62.75 in | Wt 201.0 lb

## 2020-12-31 DIAGNOSIS — E039 Hypothyroidism, unspecified: Secondary | ICD-10-CM | POA: Diagnosis not present

## 2020-12-31 DIAGNOSIS — E119 Type 2 diabetes mellitus without complications: Secondary | ICD-10-CM

## 2020-12-31 DIAGNOSIS — E1165 Type 2 diabetes mellitus with hyperglycemia: Secondary | ICD-10-CM

## 2020-12-31 LAB — POCT GLYCOSYLATED HEMOGLOBIN (HGB A1C): Hemoglobin A1C: 7.1 % — AB (ref 4.0–5.6)

## 2020-12-31 MED ORDER — LEVEMIR FLEXTOUCH 100 UNIT/ML ~~LOC~~ SOPN: 55.0000 [IU] | PEN_INJECTOR | Freq: Every day | SUBCUTANEOUS | 3 refills | Status: DC

## 2020-12-31 MED ORDER — METFORMIN HCL 1000 MG PO TABS: 1000.0000 mg | ORAL_TABLET | Freq: Two times a day (BID) | ORAL | 3 refills | Status: DC

## 2020-12-31 MED ORDER — NP THYROID 90 MG PO TABS: 90.0000 mg | ORAL_TABLET | Freq: Every day | ORAL | 3 refills | Status: DC

## 2020-12-31 MED ORDER — DEXCOM G6 SENSOR MISC: 1.0000 | 3 refills | Status: AC

## 2020-12-31 MED ORDER — BD PEN NEEDLE NANO U/F 32G X 4 MM MISC: 1.0000 [IU] | Freq: Three times a day (TID) | 3 refills | Status: DC

## 2020-12-31 MED ORDER — LYUMJEV KWIKPEN 100 UNIT/ML ~~LOC~~ SOPN: PEN_INJECTOR | SUBCUTANEOUS | 3 refills | Status: DC

## 2020-12-31 MED ORDER — DEXCOM G6 RECEIVER DEVI: 1.0000 | Freq: Once | 0 refills | Status: AC

## 2020-12-31 MED ORDER — DEXCOM G6 TRANSMITTER MISC: 1.0000 | 3 refills | Status: AC

## 2020-12-31 NOTE — Progress Notes (Signed)
Patient ID: Megan Church, female   DOB: 1956/03/23, 65 y.o.   MRN: [id number]   This visit occurred during the SARS-CoV-2 public health emergency.  Safety protocols were in place, including screening questions prior to the visit, additional usage of staff PPE, and extensive cleaning of exam room while observing appropriate contact time as indicated for disinfecting solutions.   HPI: Megan Church is a 65 y.o.-year-old female, initially referred by her PCP, Dr. Anastasio Champion, returning for follow-up for DM2, dx in 07/2008, insulin-dependent since 2012, then off, restarted 2020, uncontrolled, without long-term complications.  Last visit almost 6 months ago.  Interim history: Since last OV, she had L fusion Sx 10/15/2020. She had PNA after the intubation, now resolved. She is in PT - had 1 session and goes to another session today. No increased urination, blurry vision, nausea, chest pain. She has low back pain.  DM2: Reviewed HbA1c levels: Lab Results  Component Value Date   HGBA1C 8.5 (H) 10/14/2020   HGBA1C 8.6 (A) 09/23/2020   HGBA1C 8.4 (A) 08/08/2020   HGBA1C 7.2 (A) 02/14/2020   HGBA1C 7.7 (A) 10/18/2019   HGBA1C 7.0 (A) 07/19/2019   HGBA1C 8.7 (H) 04/03/2019   HGBA1C 8.4 (H) 02/20/2019   HGBA1C 7.9 (H) 01/24/2017   Pt is on a regimen of: - Metformin 1000 mg 2x a day, with meals >> 2000 mg with dinner - Trulicity 1.5 mg weekly >> 3 mg weekly (increased 03/2019) - Levemir 40 >> 50 >> 55 units daily - Lyumjev 8-10 units before each meal - max 2x a day We stopped glipizide in 07/2020 and switched to Lyumjev. She tried Invokana >> yeast infections. She was on Januvia-stopped 03/2019  Pt was checking her sugars 4x a day with her  freestyle libre 2 CGM - now off as she felt it was not giving accurate results. - am: 209 (vacation) - 2h after b'fast: - lunch: 134, 184 - 2h after lunch:  - dinner: 118-171, 210, 230 (snack) - 2h after dinner: 110, 240 (Mtn dew) -  bedtime:  Previously:  Lowest sugar was 53 >> 65 >> 114 >> 139 >> 70s >> 110. Highest sugar was 381 >> 338 >> 336 >> 250 >> 240  Glucometer: [place]  Pt's meals are: - Breakfast: coffee, occasionally eggs - Lunch: tuna salad - Dinner: chicken, rice/potato, salad - Snacks: 2 in the evening She is doing intermittent fasting -fasting window is in the morning.  She takes glipizide occasionally in the morning but mostly before lunch She is walking and gardening several times a week.  -No CKD, last BUN/creatinine:  Lab Results  Component Value Date   BUN 10 11/26/2020   BUN 5 (L) 10/19/2020   CREATININE 0.54 11/26/2020   CREATININE 0.65 10/19/2020  Not on ACE inhibitor/ARB.  -+ HL. Lab Results  Component Value Date   CHOL 159 02/14/2020   HDL 38.60 (L) 02/14/2020   LDLDIRECT 96.0 02/14/2020   TRIG 204.0 (H) 02/14/2020   CHOLHDL 4 02/14/2020  On Crestor 20.  - last eye exam was in 01/2020: No DR reportedly. She has a history of cataract surgeries.  -no numbness and tingling in her feet. On Neurontin for L leg pain 2/2 back pain. She had two back surgeries in the past.  Pt has FH of DM in paternal uncle.  Hypothyroidism  -Diagnosed "years" ago -On LT4, now on desiccated thyroid extract (NP thyroid) -She was previously on a very large dose of NP thyroid, 150 mg daily,  with suppressed TSH (also with fatigue, subjective hyperthermia, insomnia).  In the past she was having hot flushes on 105 mg of NP thyroid daily so we decreased to 90 mg daily.  She takes this: - in am - fasting - at least 30 min from b'fast - + calcium later in the day - no iron - + multivitamins later in the day - no PPIs - not on Biotin  TFTs were reviewed: Lab Results  Component Value Date   TSH 1.18 09/23/2020   TSH 4.14 02/14/2020   TSH 3.08 11/27/2019   TSH 0.50 10/18/2019   TSH 1.52 07/19/2019   TSH 2.16 05/22/2019   TSH 0.08 (L) 04/17/2019   TSH 0.05 (L) 02/20/2019   She also has an  isthmic thyroid nodule 1.8 cm >> this was biopsied in 2019 with benign results.  Pt denies: - feeling nodules in neck - hoarseness - dysphagia - choking - SOB with lying down  Parents have thyroid ds. No FH of thyroid cancer. No h/o radiation tx to head or neck.  No herbal supplements. No Biotin use. No recent steroids use.   She was on testosterone compounded cream >> now off.  ROS: + see HPI  Past Medical History:  Diagnosis Date   Anemia    Anxiety    Arthritis    Cancer ([place])    basal cell carcinoma right arm and nose spots removed   Depression    GERD (gastroesophageal reflux disease)    HLD (hyperlipidemia) 02/20/2019   Morbid obesity (Megan Church) 02/20/2019   Primary hypothyroidism 02/20/2019   RLS (restless legs syndrome)    Shingles    Sleep apnea    Type II diabetes mellitus, uncontrolled (Megan Church) 02/20/2019   Vitamin D deficiency disease 02/20/2019   Past Surgical History:  Procedure Laterality Date   ANTERIOR LAT LUMBAR FUSION N/A 10/15/2020   Procedure: ANTERIOR LATERAL LUMBAR FUSION 2 LEVELS (XLIF) L2-4;  Surgeon: Melina Schools, MD;  Location: [facility];  Service: Orthopedics;  Laterality: N/A;  4 hrs   BACK SURGERY  2012, 2016   lumbar fusion 2012, cleaning out of area 2016   BREAST BIOPSY Left 2010   CHOLECYSTECTOMY  2011   CLOSED REDUCTION ANKLE FRACTURE     CLOSED REDUCTION HUMERUS FRACTURE Right 2014   COLONOSCOPY WITH PROPOFOL N/A 02/21/2018   Procedure: COLONOSCOPY WITH PROPOFOL;  Surgeon: Danie Binder, MD;  [facility];  Service: Endoscopy;  Laterality: N/A;  1:15pm - pt knows to arrive at 10:30   EYE SURGERY  2016   eye lid droop    KNEE ARTHROSCOPY Right 2010   LUMBAR LAMINECTOMY/DECOMPRESSION MICRODISCECTOMY N/A 10/16/2020   Procedure: Revision decompression L2-3, L3-4;  Surgeon: Melina Schools, MD;  Location: [facility];  Service: Orthopedics;  Laterality: N/A;   POLYPECTOMY  02/21/2018   Procedure: POLYPECTOMY;  Surgeon: Danie Binder, MD;  Location: [facility];  Service: Endoscopy;;  hepatic flexure polyp   TOTAL KNEE ARTHROPLASTY Right 01/31/2017   Procedure: RIGHT TOTAL KNEE ARTHROPLASTY;  Surgeon: Paralee Cancel, MD;  Location: [facility];  Service: Orthopedics;  Laterality: Right;  90 mins   Social History   Socioeconomic History   Marital status: Married    Spouse name: Not on file   Number of children:  3   Years of education: Not on file   Highest education level: Not on file  Occupational History    + Homemaker, previously Buyer, retail  strain: Not on file   Food insecurity    Worry: Not on file    Inability: Not on file   Transportation needs    Medical: Not on file    Non-medical: Not on file  Tobacco Use   Smoking status: Former Smoker    Packs/day: 1.00    Years: 8.00    Pack years: 8.00    Types: Cigarettes    Start date: 06/14/1970    Quit date: 06/14/1978    Years since quitting: 40.8   Smokeless tobacco: Never Used   Tobacco comment: quit age 39  Substance and Sexual Activity   Alcohol use: Yes    Alcohol/week:  1-2 drinks daily    Varies        Drug use: No   Current Outpatient Medications on File Prior to Visit  Medication Sig Dispense Refill   Calcium Carbonate-Vitamin D 600-200 MG-UNIT TABS Take by mouth.     Cholecalciferol (VITAMIN D-3) 125 MCG (5000 UT) TABS Take 10,000 Units by mouth daily at 12 noon.     Continuous Blood Gluc Receiver (FREESTYLE LIBRE 2 READER) DEVI 1 each by Does not apply route daily. (Patient not taking: Reported on 11/26/2020) 1 each 0   Continuous Blood Gluc Sensor (FREESTYLE LIBRE 2 SENSOR) MISC 1 each by Does not apply route every 14 (fourteen) days. (Patient not taking: Reported on 11/26/2020) 6 each 3   Cyanocobalamin 1000 MCG TBCR Take by mouth.     Dulaglutide (TRULICITY) 3 AS/5.0NL SOPN Inject 3 mg into the skin once a week. 6 mL 3   furosemide (LASIX) 40 MG tablet Take 1 tablet (40 mg total) by mouth daily. (Patient taking differently: Take  40 mg by mouth daily as needed for edema.) 90 tablet 2   HYDROcodone-acetaminophen (NORCO/VICODIN) 5-325 MG tablet Take 1 tablet by mouth daily as needed.     Insulin Lispro-aabc, 1 U Dial, (LYUMJEV KWIKPEN) 100 UNIT/ML SOPN Inject 8-10 units before meals under skin 30 mL 3   Insulin Pen Needle (BD PEN NEEDLE NANO U/F) 32G X 4 MM MISC Inject 1 Units into the skin 3 (three) times daily. with insulin 300 each 2   LEVEMIR FLEXTOUCH 100 UNIT/ML FlexPen INJECT 50 UNITS UNDER THE SKIN AT BEDTIME (Patient taking differently: Inject 55 Units into the skin at bedtime.) 45 mL 3   Magnesium 500 MG CAPS Take by mouth.     melatonin 3 MG TABS tablet Take 3 mg by mouth at bedtime.     metFORMIN (GLUCOPHAGE) 1000 MG tablet TAKE 1 TABLET TWICE A DAY 180 tablet 3   NP THYROID 90 MG tablet Take 1 tablet (90 mg total) by mouth daily. 90 tablet 1   omeprazole (PRILOSEC) 20 MG capsule Take 1 capsule (20 mg total) by mouth every other day. 90 capsule 1   Polyethyl Glycol-Propyl Glycol (SYSTANE FREE OP) Place 1 drop into both eyes every 4 (four) hours as needed (Dry eyes).     rOPINIRole (REQUIP) 3 MG tablet TAKE 1 TABLET AT BEDTIME 90 tablet 1   rosuvastatin (CRESTOR) 20 MG tablet Take 1 tablet (20 mg total) by mouth daily. 90 tablet 1   Testosterone Propionate (FIRST-TESTOSTERONE MC) 2 % CREA Place 5 mg onto the skin daily. 30 g 0   No current facility-administered medications on file prior to visit.   Allergies  Allergen Reactions   Sulfa Antibiotics Hives and Other (See Comments)   Ace Inhibitors Cough and Other (See Comments)  Invokana [Canagliflozin] Other (See Comments)    Yeast    Vytorin [Ezetimibe-Simvastatin] Other (See Comments)    Leg cramps   Atorvastatin Hives and Other (See Comments)   Other Other (See Comments)   Family History  Problem Relation Age of Onset   Cancer Mother    Heart disease Father    Stroke Father    Cancer Father    Heart disease Brother    Hyperlipidemia Brother     Hyperlipidemia Brother    Colon cancer Neg Hx     PE: BP 120/80 (BP Location: Right Arm, Patient Position: Sitting, Cuff Size: Normal)   Pulse 83   Ht 5' 2.75" (1.594 m)   Wt 201 lb (91.2 kg)   SpO2 98%   BMI 35.89 kg/m  Wt Readings from Last 3 Encounters:  12/31/20 201 lb (91.2 kg)  11/26/20 199 lb 6.4 oz (90.4 kg)  10/16/20 201 lb 1 oz (91.2 kg)   Constitutional: overweight, in NAD Eyes: PERRLA, EOMI, no exophthalmos ENT: moist mucous membranes, no thyromegaly, no cervical lymphadenopathy Cardiovascular: tachycardia, RR, No MRG Respiratory: CTA B Gastrointestinal: abdomen soft, NT, ND, BS+ Musculoskeletal: no deformities, strength intact in all 4 Skin: moist, warm, no rashes Neurological: no tremor with outstretched hands, DTR normal in all 4  ASSESSMENT: 1. DM2, insulin-dependent, uncontrolled, without long-term complications, but with hyperglycemia  2.  Hypothyroidism -Previously uncontrolled  PLAN:  1. Patient with longstanding, uncontrolled, type 2 diabetes, on oral antidiabetic regimen with metformin, also weekly GLP-1 receptor agonist and basal-bolus insulin regimen, with still poor control.  At last visit, HbA1c was higher, at 8.6% but it improved slightly at last check in 10/2020 to 8.5%.  Based on the CGM download from last visit, I would have expected the new HbA1c to be approximately 6.5%, as predicted from the 2 weeks prior to our last visit.  At that time, there was significant improvement in her blood sugars after switching to Lyumjev before last visit, with sugars at goal overnight and becoming more variable after lunch and also higher sugars after dinner.  We discussed about using higher doses of Lyumjev before a larger meal but we did not change the regimen otherwise. - at today's visit, she is not using her freestyle libre 2 CGM as she felt that this was not accurate.  It was giving her values within 20 to 100 mg/dL difference compared to her fingersticks.  She  is not on aspirin or high dose vitamin D.  At this visit, I sent a prescription to her pharmacy for the Dexcom CGM.  I also gave her a list of suppliers in case she needs to go through them. -Reviewing her blood sugars from last month, they were mostly at goal however, they are recently much higher, as she just returned from vacation.  She is determined to return to her previous diet, so for now, we discussed about changing the dose of Lyumjev based on the size of the meal, but otherwise, I do not feel we absolutely need to change her regimen. We checked her HbA1c: 7.1% (better) - I suggested to:  Patient Instructions  Please continue: - Metformin 2000 mg at night - Trulicity 3 mg weekly - Levemir 55 units at bedtime - Lyumjev 8-12 units at the start of each meal  Please continue NP thyroid 90 mg in a.m.  Take the thyroid hormone every day, with water, at least 30 minutes before breakfast, separated by at least 4 hours from: - acid reflux  medications - calcium - iron - multivitamins  The most common suppliers for the continuous glucose monitor are:   [employer] [phone], extension [phone]  -[employer] [phone]  [employer] [place] [phone]    Please return in 3-4 months. . - advised to check sugars at different times of the day - 4x a day, rotating check times - advised for yearly eye exams >> she is UTD - return to clinic in 3-4 months  2.  Hypothyroidism - latest thyroid labs reviewed with pt. >> normal: Lab Results  Component Value Date   TSH 1.18 09/23/2020  - she continues on NP thyroid 90 mg daily (but she is open to switching back to [place] if NP thyroid becomes unavailable)  - pt feels good on this dose, w/o hypothyroid complaints - we discussed about taking the thyroid hormone every day, with water, >30 minutes before breakfast, separated by >4 hours from acid reflux medications, calcium, iron,  multivitamins. Pt. is taking it correctly.   Philemon Kingdom, MD PhD [facility] Endocrinology

## 2020-12-31 NOTE — Patient Instructions (Addendum)
Please continue: - Metformin 2000 mg at night - Trulicity 3 mg weekly - Levemir 55 units at bedtime - Lyumjev 8-12 units at the start of each meal  Please continue NP thyroid 90 mg in a.m.  Take the thyroid hormone every day, with water, at least 30 minutes before breakfast, separated by at least 4 hours from: - acid reflux medications - calcium - iron - multivitamins  The most common suppliers for the continuous glucose monitor are:   [employer] [phone], extension [phone]  -[employer] [phone]  -[place] [phone]    Please return in 3-4 months.

## 2021-01-01 ENCOUNTER — Encounter: Payer: Self-pay | Admitting: Internal Medicine

## 2021-01-01 DIAGNOSIS — E039 Hypothyroidism, unspecified: Secondary | ICD-10-CM

## 2021-01-02 MED ORDER — NP THYROID 90 MG PO TABS: 90.0000 mg | ORAL_TABLET | Freq: Every day | ORAL | 3 refills | Status: DC

## 2021-01-19 ENCOUNTER — Encounter (INDEPENDENT_AMBULATORY_CARE_PROVIDER_SITE_OTHER): Payer: Self-pay | Admitting: Nurse Practitioner

## 2021-01-22 ENCOUNTER — Other Ambulatory Visit (INDEPENDENT_AMBULATORY_CARE_PROVIDER_SITE_OTHER): Payer: Self-pay | Admitting: Nurse Practitioner

## 2021-01-22 ENCOUNTER — Encounter (INDEPENDENT_AMBULATORY_CARE_PROVIDER_SITE_OTHER): Payer: Self-pay | Admitting: Nurse Practitioner

## 2021-01-22 DIAGNOSIS — M545 Low back pain, unspecified: Secondary | ICD-10-CM

## 2021-01-22 DIAGNOSIS — K219 Gastro-esophageal reflux disease without esophagitis: Secondary | ICD-10-CM

## 2021-01-22 DIAGNOSIS — E1165 Type 2 diabetes mellitus with hyperglycemia: Secondary | ICD-10-CM

## 2021-01-22 DIAGNOSIS — E782 Mixed hyperlipidemia: Secondary | ICD-10-CM

## 2021-01-22 MED ORDER — ROSUVASTATIN CALCIUM 20 MG PO TABS: 20.0000 mg | ORAL_TABLET | Freq: Every day | ORAL | 2 refills | Status: DC

## 2021-01-22 MED ORDER — OMEPRAZOLE 20 MG PO CPDR: 20.0000 mg | DELAYED_RELEASE_CAPSULE | ORAL | 2 refills | Status: DC

## 2021-01-22 MED ORDER — ROPINIROLE HCL 3 MG PO TABS: 3.0000 mg | ORAL_TABLET | Freq: Every day | ORAL | 2 refills | Status: DC

## 2021-02-04 ENCOUNTER — Encounter: Payer: Self-pay | Admitting: Internal Medicine

## 2021-02-06 ENCOUNTER — Encounter (INDEPENDENT_AMBULATORY_CARE_PROVIDER_SITE_OTHER): Payer: Self-pay | Admitting: Nurse Practitioner

## 2021-02-07 ENCOUNTER — Telehealth: Payer: Self-pay

## 2021-02-07 NOTE — Telephone Encounter (Signed)
Inbound fax requesting forms be completed and faxed with recent clinical notes. Forms completed and faxed to [phone].

## 2021-02-26 ENCOUNTER — Ambulatory Visit (INDEPENDENT_AMBULATORY_CARE_PROVIDER_SITE_OTHER): Payer: PRIVATE HEALTH INSURANCE | Admitting: Nurse Practitioner

## 2021-02-26 ENCOUNTER — Encounter: Payer: Self-pay | Admitting: Nurse Practitioner

## 2021-02-26 ENCOUNTER — Other Ambulatory Visit: Payer: Self-pay | Admitting: Nurse Practitioner

## 2021-02-26 ENCOUNTER — Other Ambulatory Visit: Payer: Self-pay

## 2021-02-26 ENCOUNTER — Ambulatory Visit (INDEPENDENT_AMBULATORY_CARE_PROVIDER_SITE_OTHER): Payer: Medicare Other | Admitting: Nurse Practitioner

## 2021-02-26 VITALS — BP 132/78 | HR 82 | Temp 97.6°F | Ht 62.0 in | Wt 199.0 lb

## 2021-02-26 DIAGNOSIS — G4733 Obstructive sleep apnea (adult) (pediatric): Secondary | ICD-10-CM

## 2021-02-26 DIAGNOSIS — Z0001 Encounter for general adult medical examination with abnormal findings: Secondary | ICD-10-CM | POA: Insufficient documentation

## 2021-02-26 DIAGNOSIS — K760 Fatty (change of) liver, not elsewhere classified: Secondary | ICD-10-CM | POA: Diagnosis not present

## 2021-02-26 MED ORDER — IRON (FERROUS SULFATE) 325 (65 FE) MG PO TABS: 325.0000 mg | ORAL_TABLET | ORAL | 0 refills | Status: AC

## 2021-02-26 NOTE — Progress Notes (Addendum)
Subjective:  Patient ID: Megan Church, female    DOB: April 07, 1956  Age: 65 y.o. MRN: [id number]  CC:  Chief Complaint  Patient presents with   Follow-up   Sleep Apnea   Abdominal Pain   Other    Health Maintenance      HPI  This patient arrives today for the above.  Sleep apnea: She has her CPAP every single night.  She tells me she is feeling benefits from it.  She does sometimes still have daytime sleepiness and will take a nap but this seems much better than before starting using CPAP.  Abdominal pain: Patient experiencing right upper quadrant abdominal pain.  She did undergo ultrasound earlier this year which showed hepatic steatosis also possibly cirrhosis.  She has mildly elevated liver enzymes on last blood check.  She has been referred to gastroenterology and is scheduled to see them next month.  Health maintenance: She believes she is due for her Pap smear and would like to know where to go to have this completed.  She also tells me she had her flu shot and COVID-19 booster a few days ago.  Past Medical History:  Diagnosis Date   Anemia    Anxiety    Arthritis    Cancer ([facility])    basal cell carcinoma right arm and nose spots removed   Depression    GERD (gastroesophageal reflux disease)    HLD (hyperlipidemia) 02/20/2019   Morbid obesity ([facility] [place]) 02/20/2019   Primary hypothyroidism 02/20/2019   RLS (restless legs syndrome)    Shingles    Sleep apnea    Type II diabetes mellitus, uncontrolled ([facility]) 02/20/2019   Vitamin D deficiency disease 02/20/2019      Family History  Problem Relation Age of Onset   Cancer Mother    Heart disease Father    Stroke Father    Cancer Father    Heart disease Brother    Hyperlipidemia Brother    Hyperlipidemia Brother    Colon cancer Neg Hx     Social History   Social History Narrative   Married for 42 years.Moved from [place] in December as husband found new job.Used to be a librarian,retired.Husband a Journalist, newspaper from [place] retired.   Social History   Tobacco Use   Smoking status: Former    Packs/day: 1.00    Years: 8.00    Pack years: 8.00    Types: Cigarettes    Start date: 06/14/1970    Quit date: 06/14/1978    Years since quitting: 42.7   Smokeless tobacco: Never   Tobacco comments:    quit age 27  Substance Use Topics   Alcohol use: Yes    Alcohol/week: 14.0 standard drinks    Types: 7 Glasses of wine, 7 Shots of liquor per week     Current Meds  Medication Sig   Calcium Carbonate-Vitamin D 600-200 MG-UNIT TABS Take by mouth.   Cholecalciferol (VITAMIN D-3) 125 MCG (5000 UT) TABS Take 10,000 Units by mouth daily at 12 noon.   Continuous Blood Gluc Transmit (DEXCOM G6 TRANSMITTER) MISC 1 Device by Does not apply route every 3 (three) months.   Cyanocobalamin 1000 MCG TBCR Take by mouth.   Dulaglutide (TRULICITY) 3 0000000 SOPN Inject 3 mg into the skin once a week.   HYDROcodone-acetaminophen (NORCO/VICODIN) 5-325 MG tablet Take 1 tablet by mouth daily as needed.   insulin detemir (LEVEMIR FLEXTOUCH) 100 UNIT/ML FlexPen Inject 55 Units into the skin at  bedtime.   Insulin Lispro-aabc (LYUMJEV KWIKPEN) 100 UNIT/ML SOPN Inject 8-12 units before meals under skin   Insulin Pen Needle (BD PEN NEEDLE NANO U/F) 32G X 4 MM MISC Inject 1 Units into the skin 3 (three) times daily. with insulin   Magnesium 500 MG CAPS Take by mouth.   melatonin 3 MG TABS tablet Take 3 mg by mouth at bedtime.   metFORMIN (GLUCOPHAGE) 1000 MG tablet Take 1 tablet (1,000 mg total) by mouth 2 (two) times daily.   NP THYROID 90 MG tablet Take 1 tablet (90 mg total) by mouth daily.   omeprazole (PRILOSEC) 20 MG capsule Take 1 capsule (20 mg total) by mouth every other day.   Polyethyl Glycol-Propyl Glycol (SYSTANE FREE OP) Place 1 drop into both eyes every 4 (four) hours as needed (Dry eyes).   rOPINIRole (REQUIP) 3 MG tablet Take 1 tablet (3 mg total) by mouth at bedtime.   rosuvastatin  (CRESTOR) 20 MG tablet Take 1 tablet (20 mg total) by mouth daily.    ROS:  Review of Systems  Constitutional:  Negative for fever, malaise/fatigue and weight loss.  Eyes:  Negative for blurred vision and double vision.  Respiratory:  Negative for cough, shortness of breath and wheezing.   Cardiovascular:  Negative for chest pain.  Gastrointestinal:  Negative for abdominal pain and blood in stool.  Neurological:  Negative for dizziness and headaches.    Objective:   Today's Vitals: BP 132/78   Pulse 82   Temp 97.6 F (36.4 C) (Oral)   Ht '5\' 2"'$  (1.575 m)   Wt 199 lb (90.3 kg)   SpO2 96%   BMI 36.40 kg/m  Vitals with BMI 02/26/2021 12/31/2020 11/26/2020  Height '5\' 2"'$  5' 2.75" 5' 2.75"  Weight 199 lbs 201 lbs 199 lbs 6 oz  BMI 36.39 [id number]  Systolic Q000111Q [id number]  Diastolic 78 80 70  Pulse 82 83 84     Physical Exam Vitals reviewed.  Constitutional:      General: She is not in acute distress.    Appearance: Normal appearance.  HENT:     Head: Normocephalic and atraumatic.  Cardiovascular:     Rate and Rhythm: Normal rate and regular rhythm.     Pulses: Normal pulses.     Heart sounds: Normal heart sounds.  Pulmonary:     Effort: Pulmonary effort is normal.     Breath sounds: Normal breath sounds.  Skin:    General: Skin is warm and dry.  Neurological:     General: No focal deficit present.     Mental Status: She is alert and oriented to person, place, and time.  Psychiatric:        Mood and Affect: Mood normal.        Behavior: Behavior normal.        Judgment: Judgment normal.         Assessment and Plan   1. OSA (obstructive sleep apnea)   2. Encounter for general adult medical examination with abnormal findings   3. Hepatic steatosis      Plan: 1.  Appears to be stable and well controlled.  She will continue to use her CPAP at night. 2.  I recommend she reach out to [facility] as this is located in [place] and she lives in [place].  They  should build to get her scheduled to do Pap smear.  Flu vaccine and COVID-19 booster added to patient's immunization list. 3.  I encouraged her to follow-up with gastroenterology as scheduled for evaluation and monitoring of her hepatic steatosis and possible cirrhosis.  She tells me she understands.  Tests ordered No orders of the defined types were placed in this encounter.     No orders of the defined types were placed in this encounter.   Patient to follow-up with me in 6 months or sooner as needed.  Ailene Ards, NP

## 2021-02-26 NOTE — Patient Instructions (Signed)
Call [facility] to discuss scheduling a Pap Smear

## 2021-02-27 ENCOUNTER — Encounter: Payer: Self-pay | Admitting: Internal Medicine

## 2021-03-16 IMAGING — MG DIGITAL SCREENING BILATERAL MAMMOGRAM WITH TOMO AND CAD
8 series · 8 of 24 positions shown · non-contrast
Comparison: Previous exam(s).

CLINICAL DATA: Screening.

EXAM:
DIGITAL SCREENING BILATERAL MAMMOGRAM WITH TOMO AND CAD

[R MLO synth-2D]
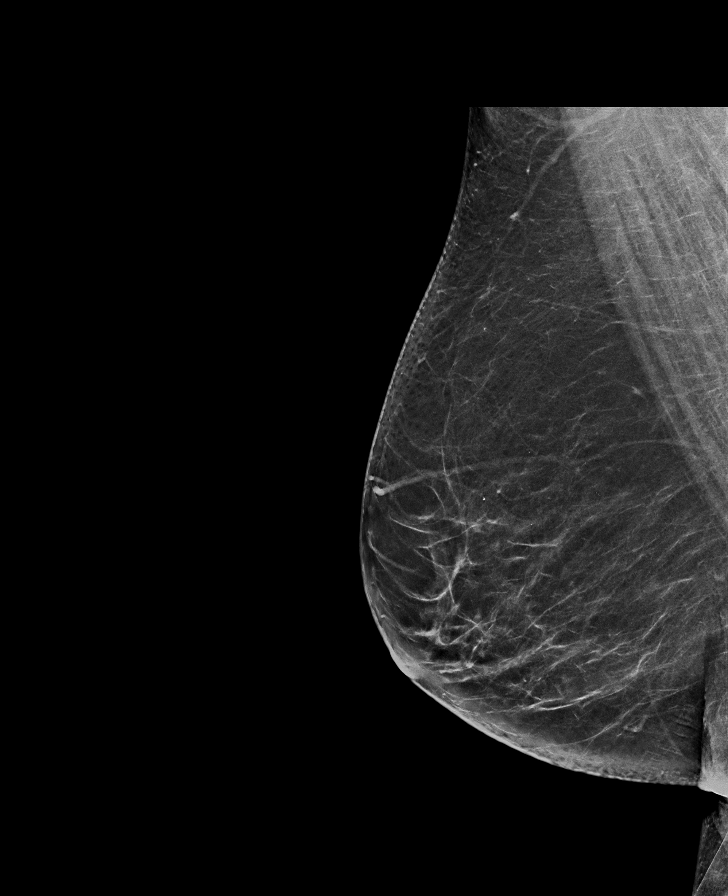

[L CC synth-2D]
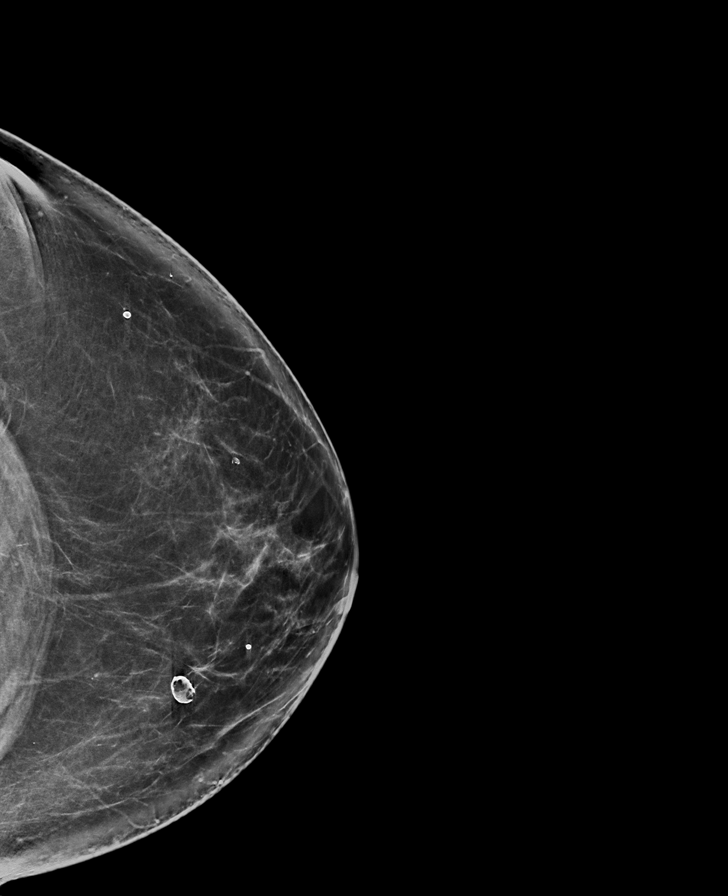

[L MLO synth-2D]
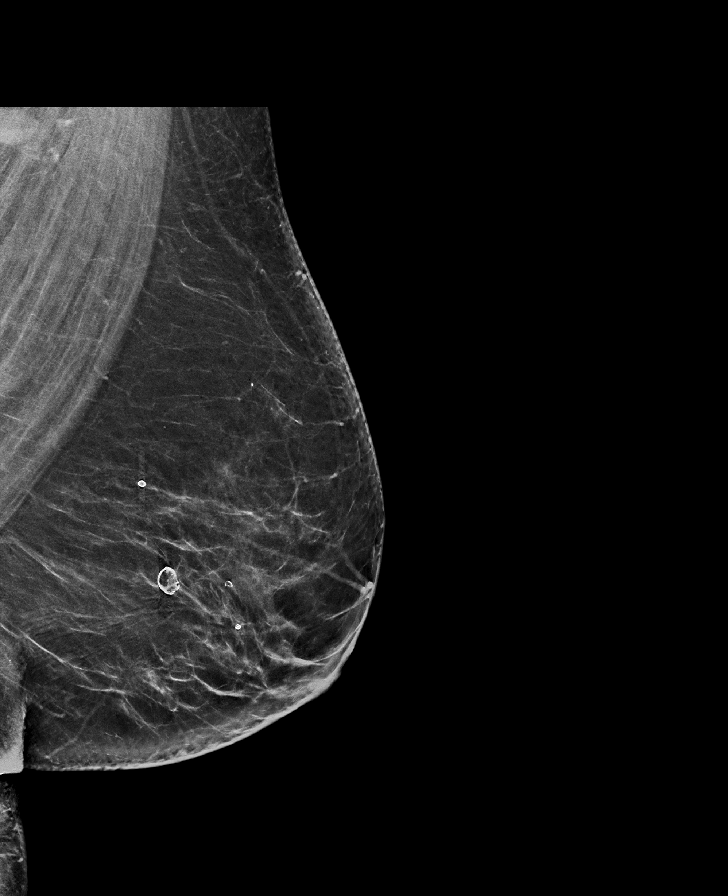

[R CC synth-2D]
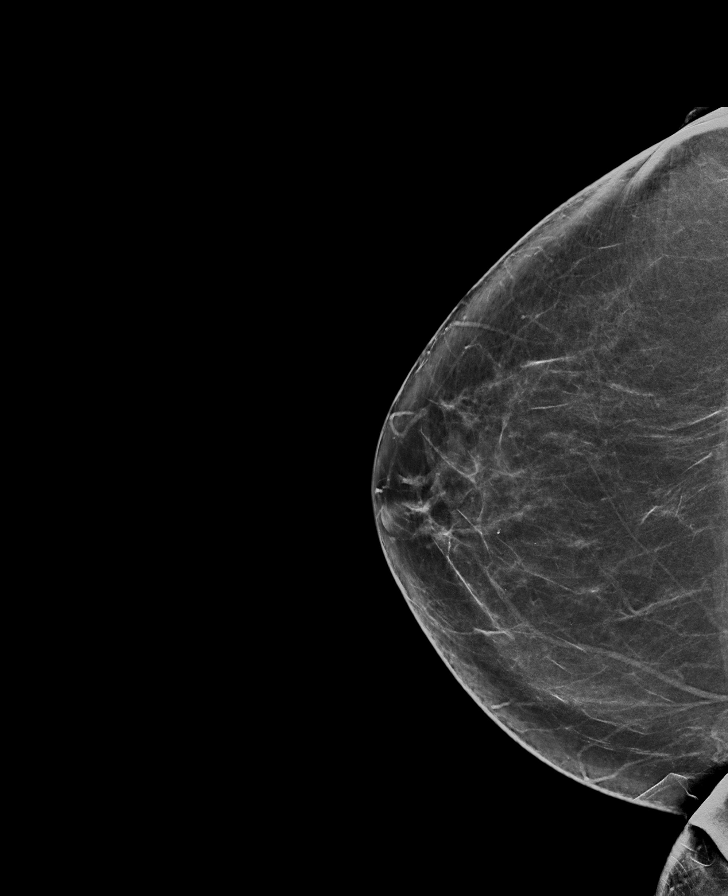

[R MLO tomo · tomo slice 39/76.0]
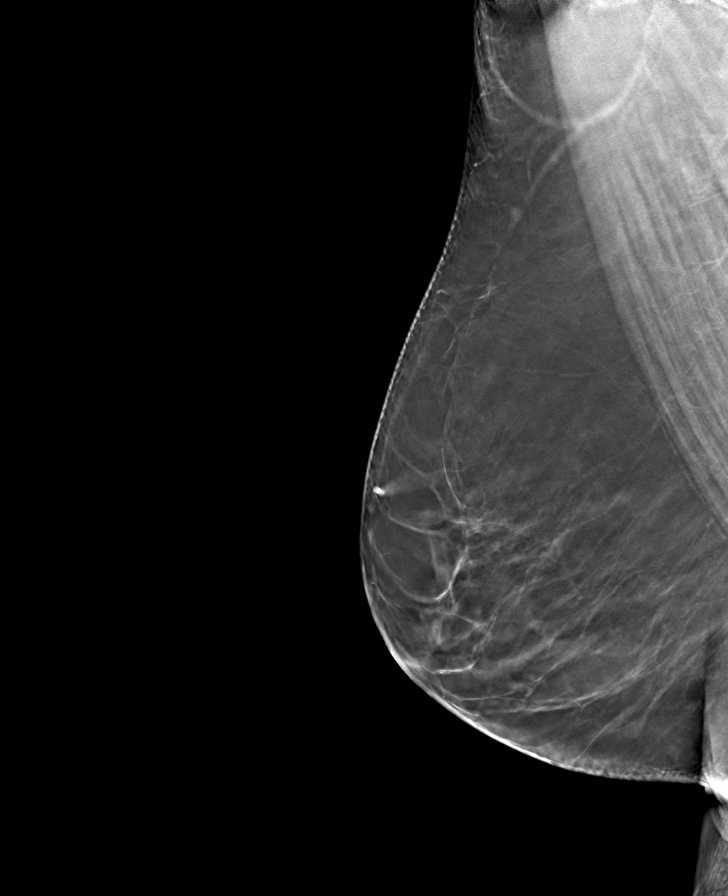

[R CC tomo · tomo slice 37/74.0]
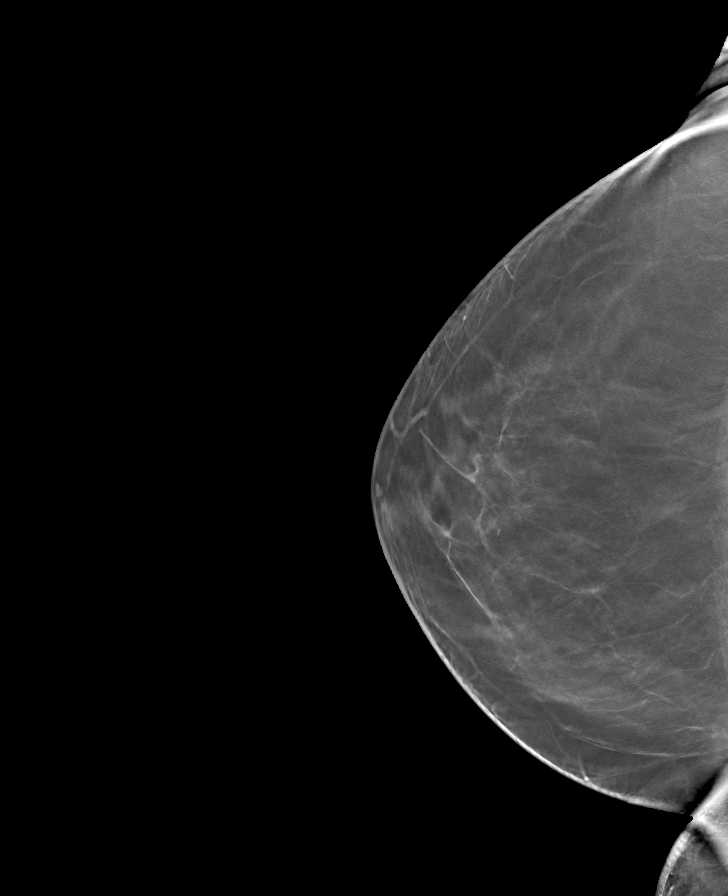

[L MLO tomo · tomo slice 41/80.0]
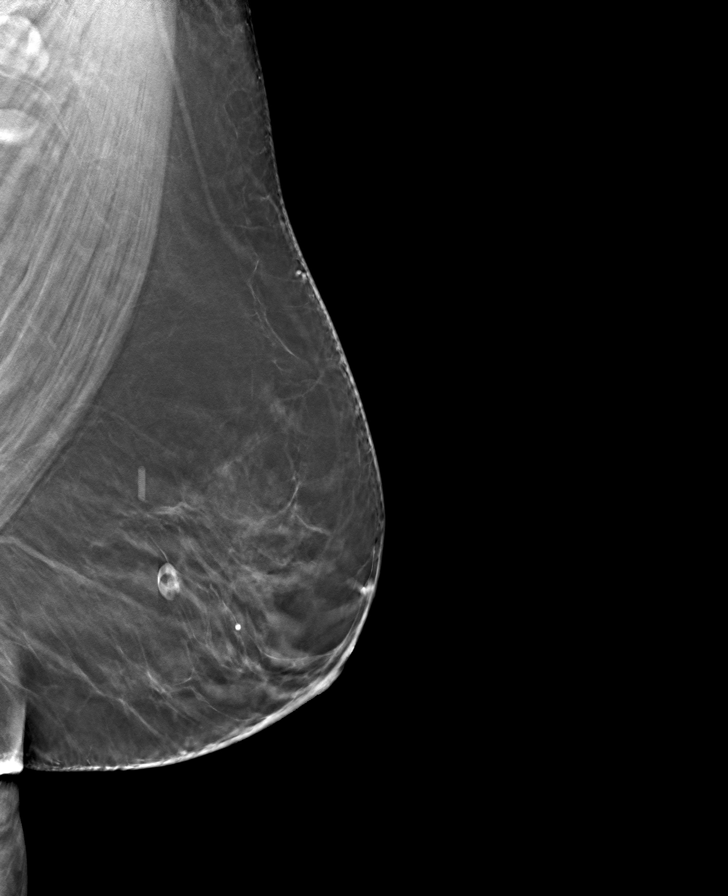

[L CC tomo · tomo slice 40/79.0]
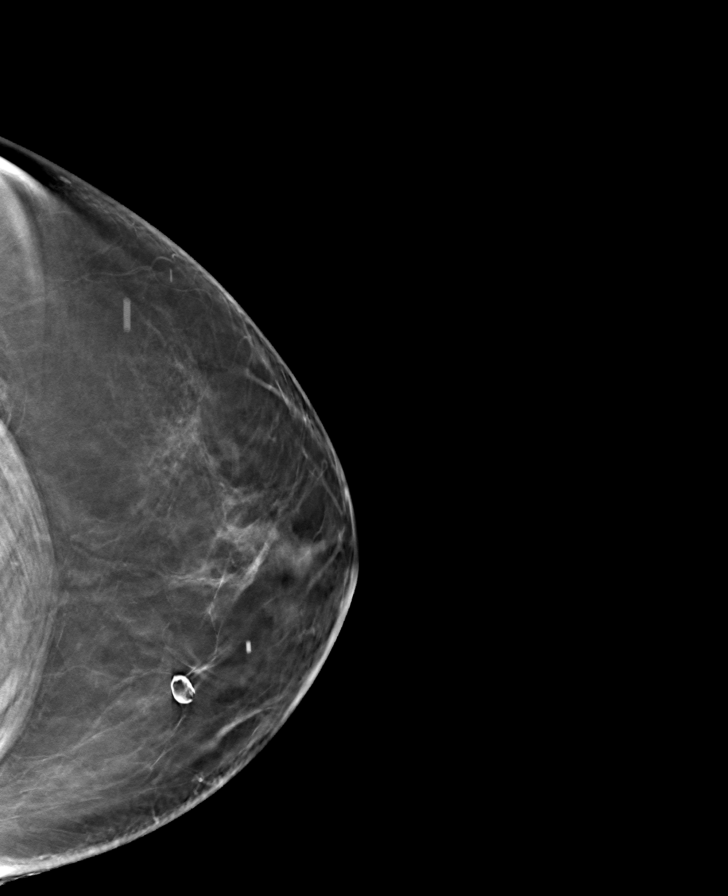

[8 of 24 positions shown; findings below may reference images not displayed]

ACR Breast Density Category b: There are scattered areas of
fibroglandular density.
FINDINGS: There are no findings suspicious for malignancy. Images were
processed with CAD.
IMPRESSION: No mammographic evidence of malignancy. A result letter of this
screening mammogram will be mailed directly to the patient.

RECOMMENDATION:
Screening mammogram in one year. (Code:[id number])

BI-RADS CATEGORY  1: Negative.

## 2021-03-24 ENCOUNTER — Other Ambulatory Visit: Payer: Self-pay

## 2021-03-24 ENCOUNTER — Ambulatory Visit (INDEPENDENT_AMBULATORY_CARE_PROVIDER_SITE_OTHER): Payer: Medicare Other | Admitting: Gastroenterology

## 2021-03-24 ENCOUNTER — Encounter: Payer: Self-pay | Admitting: Gastroenterology

## 2021-03-24 VITALS — BP 131/79 | HR 83 | Temp 96.6°F | Ht 62.0 in | Wt 203.4 lb

## 2021-03-24 DIAGNOSIS — R7989 Other specified abnormal findings of blood chemistry: Secondary | ICD-10-CM | POA: Diagnosis not present

## 2021-03-24 DIAGNOSIS — R1011 Right upper quadrant pain: Secondary | ICD-10-CM | POA: Diagnosis not present

## 2021-03-24 DIAGNOSIS — R932 Abnormal findings on diagnostic imaging of liver and biliary tract: Secondary | ICD-10-CM | POA: Diagnosis not present

## 2021-03-24 NOTE — Progress Notes (Signed)
Primary Care Physician: Ailene Ards, NP  Primary Gastroenterologist:  Elon Alas. Abbey Chatters, DO   Chief Complaint  Patient presents with   Abdominal Pain    Was RUQ, better now    HPI: Megan Church is a 65 y.o. female here for further evaluation of RUQ pain at the request of [name], NP.   Patient states she had back surgery back in May, she developed some right upper quadrant pain after that.  She thought maybe it was caused by unnatural positioning during her surgery.  Because symptoms persisted for 6 weeks she saw her PCP who ordered an ultrasound of the abdomen.  Noted to have prominent caudate lobe with subtle irregular hepatic capsule, no biliary ductal dilation.  She had rib films which were unremarkable.  Fortunately her pain has resolved.  She denies heartburn, dysphagia, vomiting.  Sometimes has loose stools, feels like it is likely medication or diet related.  No melena or rectal bleeding.   She states she weighed 238 lbs at highest weight. Lost about 35 pounds with intermittent fasting. Usually around 198.     Abdominal ultrasound June 2022: IMPRESSION: Hepatic steatosis and cirrhosis.   No biliary duct dilatation or other acute explanation for right upper quadrant pain.   Component     Latest Ref Rng & Units 02/20/2019 09/05/2019 10/14/2020 10/17/2020  Total Bilirubin     0.2 - 1.2 mg/dL 0.8  1.1 1.0  Alkaline phosphatase (APISO)     37 - 153 U/L 105     AST     10 - 35 U/L 79 (H)  49 (H) 192 (H)  ALT     6 - 29 U/L 61 (H)  42 116 (H)   Component     Latest Ref Rng & Units 10/18/2020 10/19/2020 11/26/2020  Total Bilirubin     0.2 - 1.2 mg/dL  1.1 0.5  Alkaline phosphatase (APISO)     37 - 153 U/L   155 (H)  AST     10 - 35 U/L  125 (H) 49 (H)  ALT     6 - 29 U/L  151 (H) 33 (H)     EGD December 2013: Completed in [place] debris in the stomach, possibility of gastroparesis -Irregular Z-line -Erythema in the stomach -Patchy pallor  in the duodenum -Nodularity in the duodenal bulb, 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 Resected and retrieved.  Sessile serrated polyp - MODERATE Diverticulosis in the entire examined colon. - Redundant LEFT colon. - External and internal hemorrhoids. -Colonoscopy in 5 years  Current Outpatient Medications  Medication Sig Dispense Refill   calcium carbonate (OSCAL) 1500 (600 Ca) MG TABS tablet Take by mouth daily.     Cholecalciferol (VITAMIN D-3) 125 MCG (5000 UT) TABS Take 10,000 Units by mouth daily.     Continuous Blood Gluc Transmit (DEXCOM G6 TRANSMITTER) MISC 1 Device by Does not apply route every 3 (three) months. 1 each 3   Cyanocobalamin 1000 MCG TBCR Take 1 tablet by mouth daily.     Dulaglutide (TRULICITY) 3 LY/6.5KP SOPN Inject 3 mg into the skin once a week. 6 mL 3   HYDROcodone-acetaminophen (NORCO/VICODIN) 5-325 MG tablet Take 1 tablet by mouth daily as needed.     insulin detemir (LEVEMIR FLEXTOUCH) 100 UNIT/ML FlexPen Inject 55 Units into the skin at bedtime. 45 mL 3  Insulin Lispro-aabc (LYUMJEV KWIKPEN) 100 UNIT/ML SOPN Inject 8-12 units before meals under skin (Patient taking differently: Inject 8-10 units before meals under skin) 30 mL 3   Insulin Pen Needle (BD PEN NEEDLE NANO U/F) 32G X 4 MM MISC Inject 1 Units into the skin 3 (three) times daily. with insulin 300 each 3   Iron, Ferrous Sulfate, 325 (65 Fe) MG TABS Take 325 mg by mouth every other day. 30 tablet 0   Magnesium 500 MG CAPS Take 1 capsule by mouth daily.     melatonin 3 MG TABS tablet Take 3 mg by mouth at bedtime.     metFORMIN (GLUCOPHAGE) 1000 MG tablet Take 1 tablet (1,000 mg total) by mouth 2 (two) times daily. 180 tablet 3   NP THYROID 90 MG tablet Take 1 tablet (90 mg total) by mouth daily. 90 tablet 3   omeprazole (PRILOSEC) 20 MG capsule Take 1 capsule (20 mg total) by mouth every  other day. 90 capsule 2   Polyethyl Glycol-Propyl Glycol (SYSTANE FREE OP) Place 1 drop into both eyes every 4 (four) hours as needed (Dry eyes).     rOPINIRole (REQUIP) 3 MG tablet Take 1 tablet (3 mg total) by mouth at bedtime. 90 tablet 2   rosuvastatin (CRESTOR) 20 MG tablet Take 1 tablet (20 mg total) by mouth daily. 90 tablet 2   vitamin B-12 (CYANOCOBALAMIN) 1000 MCG tablet Take 1,000 mcg by mouth daily.     No current facility-administered medications for this visit.    Allergies as of 03/24/2021 - Review Complete 03/24/2021  Allergen Reaction Noted   Sulfa antibiotics Hives and Other (See Comments) 07/24/2016   Ace inhibitors Cough and Other (See Comments) 01/20/2017   Invokana [canagliflozin] Other (See Comments) 01/20/2017   Vytorin [ezetimibe-simvastatin] Other (See Comments) 01/20/2017   Atorvastatin Hives and Other (See Comments) 07/24/2016   Other Other (See Comments) 11/26/2020   Past Medical History:  Diagnosis Date   Anemia    Anxiety    Arthritis    Cancer ([place])    basal cell carcinoma right arm and nose spots removed   Depression    GERD (gastroesophageal reflux disease)    HLD (hyperlipidemia) 02/20/2019   Morbid obesity ([place]) 02/20/2019   Primary hypothyroidism 02/20/2019   RLS (restless legs syndrome)    Shingles    Sleep apnea    Type II diabetes mellitus, uncontrolled 02/20/2019   Vitamin D deficiency disease 02/20/2019   Past Surgical History:  Procedure Laterality Date   ANTERIOR LAT LUMBAR FUSION N/A 10/15/2020   Procedure: ANTERIOR LATERAL LUMBAR FUSION 2 LEVELS (XLIF) L2-4;  Surgeon: Melina Schools, MD;  Location: [facility];  Service: Orthopedics;  Laterality: N/A;  4 hrs   BACK SURGERY  2012, 2016   lumbar fusion 2012, cleaning out of area 2016   BREAST BIOPSY Left 2010   CHOLECYSTECTOMY  2011   CLOSED REDUCTION ANKLE FRACTURE     CLOSED REDUCTION HUMERUS FRACTURE Right 2014   COLONOSCOPY WITH PROPOFOL N/A 02/21/2018   Procedure: COLONOSCOPY WITH  PROPOFOL;  Surgeon: Danie Binder, MD;  [facility];  Service: Endoscopy;  Laterality: N/A;  1:15pm - pt knows to arrive at 10:30   EYE SURGERY  2016   eye lid droop    KNEE ARTHROSCOPY Right 2010   LUMBAR LAMINECTOMY/DECOMPRESSION MICRODISCECTOMY N/A 10/16/2020   Procedure: Revision decompression L2-3, L3-4;  Surgeon: Melina Schools, MD;  Location: [facility];  Service: Orthopedics;  Laterality: N/A;   POLYPECTOMY  02/21/2018   Procedure: POLYPECTOMY;  Surgeon: Danie Binder, MD;  [facility];  Service: Endoscopy;;  hepatic flexure polyp   SKIN CANCER EXCISION     basal cell cancer, arm/face   TOTAL KNEE ARTHROPLASTY Right 01/31/2017   Procedure: RIGHT TOTAL KNEE ARTHROPLASTY;  Surgeon: Paralee Cancel, MD;  Location: [facility];  Service: Orthopedics;  Laterality: Right;  90 mins   Family History  Problem Relation Age of Onset   Cancer Mother        breast   Heart disease Father    Stroke Father    Cancer Father        liver cancer had resection, had "hepatitis"   Heart disease Brother    Hyperlipidemia Brother    Hyperlipidemia Brother    Colon cancer Neg Hx    Social History   Tobacco Use   Smoking status: Former    Packs/day: 1.00    Years: 8.00    Pack years: 8.00    Types: Cigarettes    Start date: 06/14/1970    Quit date: 06/14/1978    Years since quitting: 42.8   Smokeless tobacco: Never   Tobacco comments:    quit age 48  Vaping Use   Vaping Use: Never used  Substance Use Topics   Alcohol use: Yes    Alcohol/week: 14.0 standard drinks    Types: 7 Glasses of wine, 7 Shots of liquor per week    Comment: 2 mixed drinks/day   Drug use: No    ROS:  General: Negative for anorexia, unintentional weight loss, fever, chills, fatigue, weakness. ENT: Negative for hoarseness, difficulty swallowing , nasal congestion. CV: Negative for chest pain, angina, palpitations, dyspnea on exertion, peripheral edema.  Respiratory: Negative for dyspnea at rest,  dyspnea on exertion, cough, sputum, wheezing.  GI: See history of present illness. GU:  Negative for dysuria, hematuria, urinary incontinence, urinary frequency, nocturnal urination.  Endo: Negative for unusual weight change.    Physical Examination:   BP 131/79   Pulse 83   Temp (!) 96.6 F (35.9 C) (Temporal)   Ht $R'5\' 2"'OY$  (1.575 m)   Wt 203 lb 6.4 oz (92.3 kg)   BMI 37.20 kg/m   General: Well-nourished, well-developed in no acute distress.  Eyes: No icterus. Mouth: masked Lungs: Clear to auscultation bilaterally.  Heart: Regular rate and rhythm, no murmurs rubs or gallops.  Abdomen: Bowel sounds are normal,  nondistended, no splenomegaly or masses, no abdominal bruits or hernia , no rebound or guarding.  Mild tenderness with palpation over the right lower anterior ribs. Liver edge easily palpated in the epigastric region and below the RCM in MCL Extremities: No lower extremity edema. No clubbing or deformities. Neuro: Alert and oriented x 4   Skin: Warm and dry, no jaundice.   Psych: Alert and cooperative, normal mood and affect.  Labs:  Lab Results  Component Value Date   CREATININE 0.54 11/26/2020   BUN 10 11/26/2020   NA 141 11/26/2020   K 4.0 11/26/2020   CL 104 11/26/2020   CO2 29 11/26/2020   Lab Results  Component Value Date   ALT 33 (H) 11/26/2020   AST 49 (H) 11/26/2020   ALKPHOS 133 (H) 10/19/2020   BILITOT 0.5 11/26/2020   Lab Results  Component Value Date   WBC 4.9 11/26/2020   HGB 11.5 (L) 11/26/2020   HCT 36.2 11/26/2020   MCV 92.1 11/26/2020   PLT 179 11/26/2020   Lab Results  Component Value Date   IRON 68 11/26/2020   FERRITIN 65 11/26/2020   Lab Results  Component Value Date   VITAMINB12 1,027 02/20/2019   Lab Results  Component Value Date   FOLATE 10.3 02/20/2019   Lab Results  Component Value Date   INR 1.3 (H) 10/17/2020   INR 1.1 10/14/2020   INR 1.1 09/05/2019   Lab Results  Component Value Date   HGBA1C 7.1 (A)  12/31/2020     Imaging Studies: No results found.   Assessment:  Right upper quadrant pain: Essentially resolved.  She has some mild tenderness with palpation of the right anterior lower ribs but no abdominal pain.  Liver edge is easily palpated in the epigastric region and below the right anterior ribs.    Abnormal liver on recent ultrasound: Recent ultrasound with findings concerning for cirrhosis with enlarged caudate lobe and somewhat nodular border.  She is at risk of Karlene Lineman from obesity, diabetes.  Her transaminases have been elevated at least dating back to September 2020 as outlined above.  Alkaline phosphatase elevated back in June.  Pursue work-up for etiology.  Encouraged significant decrease in alcohol consumption, increase physical activity as tolerated given recent back surgery.  Tight glycemic control.  Plan: Obtain labs to work-up liver disease. Start cutting back on alcohol, ultimate goal would be to stop completely given liver concerns. Increase physical activity as tolerated. Tight glycemic control. Gradual weight loss of 1 to 2 pounds per week until weight loss goal met.

## 2021-03-24 NOTE — Patient Instructions (Signed)
Please have labs done at [facility] in [place].  We will contact you with results as available. Please start cutting back on alcohol consumption.  Start with no more than 1 drink per day, ultimate goal would be to stop completely given liver concerns. Be as active as you can.  If you are able to walk 20 minutes daily, that would be a good start. Control your diabetes as tightly as possible. Gradual weight loss of 1 to 2 pounds per week until you meet your goal.

## 2021-03-25 ENCOUNTER — Ambulatory Visit: Payer: PRIVATE HEALTH INSURANCE | Admitting: Gastroenterology

## 2021-03-29 ENCOUNTER — Encounter: Payer: Self-pay | Admitting: Internal Medicine

## 2021-03-29 LAB — COMPREHENSIVE METABOLIC PANEL
ALT: 39 IU/L — ABNORMAL HIGH (ref 0–32)
AST: 49 IU/L — ABNORMAL HIGH (ref 0–40)
Albumin/Globulin Ratio: 1.9 (ref 1.2–2.2)
Albumin: 4.6 g/dL (ref 3.8–4.8)
Alkaline Phosphatase: 141 IU/L — ABNORMAL HIGH (ref 44–121)
BUN/Creatinine Ratio: 15 (ref 12–28)
BUN: 9 mg/dL (ref 8–27)
Bilirubin Total: 0.7 mg/dL (ref 0.0–1.2)
CO2: 28 mmol/L (ref 20–29)
Calcium: 10 mg/dL (ref 8.7–10.3)
Chloride: 100 mmol/L (ref 96–106)
Creatinine, Ser: 0.59 mg/dL (ref 0.57–1.00)
Globulin, Total: 2.4 g/dL (ref 1.5–4.5)
Glucose: 198 mg/dL — ABNORMAL HIGH (ref 70–99)
Potassium: 4.3 mmol/L (ref 3.5–5.2)
Sodium: 142 mmol/L (ref 134–144)
Total Protein: 7 g/dL (ref 6.0–8.5)
eGFR: 100 mL/min/{1.73_m2} (ref >59)

## 2021-03-29 LAB — IRON,TIBC AND FERRITIN PANEL
Ferritin: 91 ng/mL (ref 15–150)
Iron Saturation: 15 % (ref 15–55)
Iron: 67 ug/dL (ref 27–139)
Total Iron Binding Capacity: 434 ug/dL (ref 250–450)
UIBC: 367 ug/dL (ref 118–369)

## 2021-03-29 LAB — NASH FIBROSURE
ALPHA 2-MACROGLOBULINS, QN: 149 mg/dL (ref 110–276)
ALT (SGPT) P5P: 45 IU/L — ABNORMAL HIGH (ref 0–40)
AST (SGOT) P5P: 59 IU/L — ABNORMAL HIGH (ref 0–40)
Apolipoprotein A-1: 141 mg/dL (ref 116–209)
Bilirubin, Total: 0.6 mg/dL (ref 0.0–1.2)
Cholesterol, Total: 161 mg/dL (ref 100–199)
Fibrosis Score: 0.37 — ABNORMAL HIGH (ref 0.00–0.21)
GGT: 216 IU/L — ABNORMAL HIGH (ref 0–60)
Glucose: 214 mg/dL — ABNORMAL HIGH (ref 70–99)
Haptoglobin: 117 mg/dL (ref 37–355)
Height: 65 in
NASH Score: 0.75 — ABNORMAL HIGH
Steatosis Score: 0.9 — ABNORMAL HIGH (ref 0.00–0.30)
Triglycerides: 124 mg/dL (ref 0–149)
Weight: 169 [lb_av]

## 2021-03-29 LAB — IGG, IGA, IGM
IgA/Immunoglobulin A, Serum: 392 mg/dL — ABNORMAL HIGH (ref 87–352)
IgG (Immunoglobin G), Serum: 999 mg/dL (ref 586–1602)
IgM (Immunoglobulin M), Srm: 114 mg/dL (ref 26–217)

## 2021-03-29 LAB — PROTIME-INR
INR: 1 (ref 0.9–1.2)
Prothrombin Time: 10.8 s (ref 9.1–12.0)

## 2021-03-29 LAB — HEPATITIS B SURFACE ANTIBODY,QUALITATIVE: Hep B Surface Ab, Qual: NONREACTIVE

## 2021-03-29 LAB — HEPATITIS C ANTIBODY: Hep C Virus Ab: <0.1 s/co ratio (ref 0.0–0.9)

## 2021-03-29 LAB — MITOCHONDRIAL/SMOOTH MUSCLE AB PNL
Mitochondrial Ab: <20 Units (ref 0.0–20.0)
Smooth Muscle Ab: 5 Units (ref 0–19)

## 2021-03-29 LAB — HEPATITIS B SURFACE ANTIGEN: Hepatitis B Surface Ag: NEGATIVE

## 2021-03-29 LAB — TISSUE TRANSGLUTAMINASE, IGA: Transglutaminase IgA: <2 U/mL (ref 0–3)

## 2021-03-29 LAB — HEPATITIS A ANTIBODY, TOTAL: hep A Total Ab: POSITIVE — AB

## 2021-03-29 LAB — ANA: Anti Nuclear Antibody (ANA): NEGATIVE

## 2021-04-01 ENCOUNTER — Encounter: Payer: Self-pay | Admitting: Internal Medicine

## 2021-04-03 ENCOUNTER — Ambulatory Visit (INDEPENDENT_AMBULATORY_CARE_PROVIDER_SITE_OTHER): Payer: Medicare Other | Admitting: Obstetrics & Gynecology

## 2021-04-03 ENCOUNTER — Other Ambulatory Visit (HOSPITAL_COMMUNITY)
Admission: RE | Admit: 2021-04-03 | Discharge: 2021-04-03 | Disposition: A | Discharge status: Home / Self Care | Payer: Medicare Other | Source: Ambulatory Visit | Attending: Obstetrics & Gynecology | Admitting: Obstetrics & Gynecology

## 2021-04-03 ENCOUNTER — Encounter: Payer: Self-pay | Admitting: Obstetrics & Gynecology

## 2021-04-03 ENCOUNTER — Other Ambulatory Visit: Payer: Self-pay

## 2021-04-03 VITALS — BP 130/75 | HR 84 | Ht 63.0 in | Wt 201.4 lb

## 2021-04-03 DIAGNOSIS — N760 Acute vaginitis: Secondary | ICD-10-CM

## 2021-04-03 DIAGNOSIS — E559 Vitamin D deficiency, unspecified: Secondary | ICD-10-CM | POA: Diagnosis not present

## 2021-04-03 DIAGNOSIS — Z1151 Encounter for screening for human papillomavirus (HPV): Secondary | ICD-10-CM | POA: Insufficient documentation

## 2021-04-03 DIAGNOSIS — Z01419 Encounter for gynecological examination (general) (routine) without abnormal findings: Secondary | ICD-10-CM

## 2021-04-03 DIAGNOSIS — Z780 Asymptomatic menopausal state: Secondary | ICD-10-CM | POA: Diagnosis not present

## 2021-04-03 MED ORDER — FLUCONAZOLE 150 MG PO TABS: 150.0000 mg | ORAL_TABLET | Freq: Once | ORAL | 0 refills | Status: DC | PRN

## 2021-04-03 NOTE — Progress Notes (Signed)
WELL-WOMAN EXAMINATION Patient name: Megan Church MRN [id number]  Date of birth: 12-Nov-1955 Chief Complaint:   Gynecologic Exam  History of Present Illness:   Megan Church is a 65 y.o. PM female being seen today for a routine well-woman exam.   Old records obtained and reviewed: -previously on HRT/testosterone- now disconitnued -Notes occasional hot flash, currently rates symptoms 3/10  Report h/o recurrent vaginitis- typically when she has a dental procedure and prescribed antibiotics she will always have a yeast infection.  []  Desires ppx treatment   No LMP recorded. Patient is postmenopausal. Denies vaginal bleeding, notes slight white discharge- no itching or irritation.  Denies pelvic or abdominal pain.  No acute complaints   Last pap/HPV- 07/2016 neg.  Last mammogram: 12/2020. Last colonoscopy: 2019  Depression screen [facility] 2/9 04/03/2021 11/26/2020 05/26/2020  Decreased Interest 0 0 0  Down, Depressed, Hopeless 0 0 0  PHQ - 2 Score 0 0 0  Altered sleeping 0 0 0  Tired, decreased energy 1 0 0  Change in appetite 0 0 0  Feeling bad or failure about yourself  0 0 0  Trouble concentrating 0 0 0  Moving slowly or fidgety/restless 0 0 0  Suicidal thoughts 0 0 0  PHQ-9 Score 1 0 0  Difficult doing work/chores - Not difficult at all Not difficult at all      Review of Systems:   Pertinent items are noted in HPI Denies any headaches, blurred vision, fatigue, shortness of breath, chest pain, abdominal pain, bowel movements, urination, or intercourse unless otherwise stated above.  Pertinent History Reviewed:  Reviewed past medical,surgical, social and family history.  Reviewed problem list, medications and allergies. Physical Assessment:   Vitals:   04/03/21 1038  BP: 130/75  Pulse: 84  Weight: 201 lb 6.4 oz (91.4 kg)  Height: 5\' 3"  (1.6 m)  Body mass index is 35.68 kg/m.        Physical Examination:   General appearance - well appearing, and in no  distress  Mental status - alert, oriented to person, place, and time  Psych:  She has a normal mood and affect  Skin - warm and dry, normal color, no suspicious lesions noted  Chest - effort normal, all lung fields clear to auscultation bilaterally  Heart - normal rate and regular rhythm  Neck:  midline trachea, no thyromegaly or nodules  Breasts - breasts appear normal, no suspicious masses, no skin or nipple changes or  axillary nodes  Abdomen - obese, soft, nontender, nondistended, no masses or organomegaly  Pelvic - VULVA: normal appearing vulva with no masses, tenderness or lesions  VAGINA: normal appearing vagina with normal color and discharge, no lesions  CERVIX: normal appearing cervix without discharge or lesions, no CMT  Thin prep pap is done with HR HPV cotesting  UTERUS: uterus is felt to be normal size, shape, consistency and nontender   ADNEXA: No adnexal masses or tenderness noted.  Extremities:  No swelling or varicosities noted  Chaperone: Celene Squibb     Assessment & Plan:  1) Well-Woman Exam -pap collected, reviewed ASCCP guidelines, if negative today, no further testing indicated -mammogram and colonoscopy up to date -Dexa ordered  2) h/o recurrent vaginitis -Diflucan sent in to take as needed s/p antibiotics  Orders Placed This Encounter  Procedures   DG Bone Density     Meds:  Meds ordered this encounter  Medications   fluconazole (DIFLUCAN) 150 MG tablet    Sig: Take 1  tablet (150 mg total) by mouth once as needed for up to 1 dose.    Dispense:  6 tablet    Refill:  0     Follow-up: Return in about 1 year (around 04/03/2022) for 1-27yr annual.   Janyth Pupa, DO Attending [place], [place] for [place], [place]

## 2021-04-07 LAB — CYTOLOGY - PAP
Comment: NEGATIVE
Diagnosis: NEGATIVE
High risk HPV: NEGATIVE

## 2021-04-22 ENCOUNTER — Encounter: Payer: Self-pay | Admitting: Internal Medicine

## 2021-04-22 ENCOUNTER — Other Ambulatory Visit: Payer: Self-pay

## 2021-04-22 ENCOUNTER — Ambulatory Visit (INDEPENDENT_AMBULATORY_CARE_PROVIDER_SITE_OTHER): Payer: Medicare Other | Admitting: Internal Medicine

## 2021-04-22 VITALS — BP 118/72 | HR 92 | Ht 63.0 in | Wt 197.8 lb

## 2021-04-22 DIAGNOSIS — E1165 Type 2 diabetes mellitus with hyperglycemia: Secondary | ICD-10-CM

## 2021-04-22 DIAGNOSIS — E039 Hypothyroidism, unspecified: Secondary | ICD-10-CM | POA: Diagnosis not present

## 2021-04-22 DIAGNOSIS — E782 Mixed hyperlipidemia: Secondary | ICD-10-CM | POA: Diagnosis not present

## 2021-04-22 LAB — POCT GLYCOSYLATED HEMOGLOBIN (HGB A1C): Hemoglobin A1C: 7.6 % — AB (ref 4.0–5.6)

## 2021-04-22 MED ORDER — OZEMPIC (1 MG/DOSE) 4 MG/3ML ~~LOC~~ SOPN: 1.0000 mg | PEN_INJECTOR | SUBCUTANEOUS | 3 refills | Status: DC

## 2021-04-22 NOTE — Progress Notes (Signed)
Patient ID: Megan Church, female   DOB: 10-23-1955, 65 y.o.   MRN: [id number]   This visit occurred during the SARS-CoV-2 public health emergency.  Safety protocols were in place, including screening questions prior to the visit, additional usage of staff PPE, and extensive cleaning of exam room while observing appropriate contact time as indicated for disinfecting solutions.   HPI: Megan Church is a 65 y.o.-year-old female, initially referred by her PCP, Dr. Anastasio Champion, returning for follow-up for DM2, dx in 07/2008, insulin-dependent since 2012, then off, restarted 2020, uncontrolled, without long-term complications.  Last visit 3.5 months ago.  Interim history: No increased urination, blurry vision, nausea, chest pain. She continues to have low back pain - was in PT She is working on eliminating the snacks. She lost few lbs.  DM2: Reviewed HbA1c levels: Lab Results  Component Value Date   HGBA1C 7.1 (A) 12/31/2020   HGBA1C 8.5 (H) 10/14/2020   HGBA1C 8.6 (A) 09/23/2020   HGBA1C 8.4 (A) 08/08/2020   HGBA1C 7.2 (A) 02/14/2020   HGBA1C 7.7 (A) 10/18/2019   HGBA1C 7.0 (A) 07/19/2019   HGBA1C 8.7 (H) 04/03/2019   HGBA1C 8.4 (H) 02/20/2019   HGBA1C 7.9 (H) 01/24/2017   Pt is on a regimen of: - Metformin 1000 mg 2x a day, with meals >> 2000 mg with dinner - Trulicity 1.5 mg weekly >> 3 mg weekly (increased 03/2019) - Levemir 40 >> 50 >> 55 units daily - Lyumjev 8-12 >> 10-12 units before each meal - 2x a day We stopped glipizide in 07/2020 and switched to Lyumjev. She tried Invokana >> yeast infections. She was on Januvia-stopped 03/2019  Pt was checking her sugars 4x a day with her freestyle libre CGM:   Previously - am: 209 (vacation) - 2h after b'fast: - lunch: 134, 184 - 2h after lunch:  - dinner: 118-171, 210, 230 (snack) - 2h after dinner: 110, 240 (Mtn dew) - bedtime:  Previously:  Lowest sugar was 53 >>...70s >> 110  >> 39 (CGM) Highest sugar was 381 >>  .Marland Kitchen.250 >> 240 >> 300s.  Glucometer: [place]  Pt's meals are: - Breakfast: coffee, occasionally eggs - Lunch: tuna salad - Dinner: chicken, rice/potato, salad - Snacks: 2 in the evening She is doing intermittent fasting -fasting window is in the morning.  She takes glipizide occasionally in the morning but mostly before lunch She is walking and gardening several times a week.  -No CKD, last BUN/creatinine:  Lab Results  Component Value Date   BUN 9 03/24/2021   BUN 10 11/26/2020   CREATININE 0.59 03/24/2021   CREATININE 0.54 11/26/2020  Not on ACE inhibitor/ARB.  -+ HL. Lab Results  Component Value Date   CHOL 161 03/24/2021   HDL 38.60 (L) 02/14/2020   LDLDIRECT 96.0 02/14/2020   TRIG 124 03/24/2021   CHOLHDL 4 02/14/2020  On Crestor 20.  - last eye exam was in 01/2020: No DR reportedly. She has a history of cataract surgeries.  -no numbness and tingling in her feet. On Neurontin for L leg pain 2/2 back pain. She had two back surgeries in the past.  Pt has FH of DM in paternal uncle.  Hypothyroidism  -Diagnosed "years" ago -On LT4 prev.,  now on desiccated thyroid extract (NP thyroid) -She was previously on a very large dose of NP thyroid, 150 mg daily, with suppressed TSH (also with fatigue, subjective hyperthermia, insomnia).  In the past she was having hot flushes on 105 mg of NP thyroid  daily so we decreased to 90 mg daily.  She takes this: - in am - fasting - at least 30 min from b'fast - + calcium later in the day - no iron - + multivitamins later in the day - no PPIs - not on Biotin  TFTs were reviewed: Lab Results  Component Value Date   TSH 1.18 09/23/2020   TSH 4.14 02/14/2020   TSH 3.08 11/27/2019   TSH 0.50 10/18/2019   TSH 1.52 07/19/2019   TSH 2.16 05/22/2019   TSH 0.08 (L) 04/17/2019   TSH 0.05 (L) 02/20/2019   She also has an isthmic thyroid nodule 1.8 cm >> this was biopsied in 2019 with benign results.  Pt denies: - feeling nodules  in neck - hoarseness - dysphagia - choking - SOB with lying down  Parents have thyroid ds. No FH of thyroid cancer. No h/o radiation tx to head or neck.  No herbal supplements. No Biotin use. No recent steroids use.   She had a T12 vertebral compression fracture in 03/2020 after a fall. She had L fusion Sx 10/15/2020. She had PNA after the intubation. She was on testosterone compounded cream >> now off.  ROS: + see HPI  Past Medical History:  Diagnosis Date   Anemia    Anxiety    Arthritis    Cancer ([place])    basal cell carcinoma right arm and nose spots removed   Depression    GERD (gastroesophageal reflux disease)    HLD (hyperlipidemia) 02/20/2019   Morbid obesity ([place]) 02/20/2019   Primary hypothyroidism 02/20/2019   RLS (restless legs syndrome)    Shingles    Sleep apnea    Type II diabetes mellitus, uncontrolled 02/20/2019   diagnosed 2010   Vitamin D deficiency disease 02/20/2019   Past Surgical History:  Procedure Laterality Date   ANTERIOR LAT LUMBAR FUSION N/A 10/15/2020   Procedure: ANTERIOR LATERAL LUMBAR FUSION 2 LEVELS (XLIF) L2-4;  Surgeon: Melina Schools, MD;  Location: [facility];  Service: Orthopedics;  Laterality: N/A;  4 hrs   BACK SURGERY  2012, 2016   lumbar fusion 2012, cleaning out of area 2016   BREAST BIOPSY Left 2010   CHOLECYSTECTOMY  2011   CLOSED REDUCTION ANKLE FRACTURE     CLOSED REDUCTION HUMERUS FRACTURE Right 2014   COLONOSCOPY WITH PROPOFOL N/A 02/21/2018   Procedure: COLONOSCOPY WITH PROPOFOL;  Surgeon: Danie Binder, MD;  [facility];  Service: Endoscopy;  Laterality: N/A;  1:15pm - pt knows to arrive at 10:30   EYE SURGERY  2016   eye lid droop    KNEE ARTHROSCOPY Right 2010   LUMBAR LAMINECTOMY/DECOMPRESSION MICRODISCECTOMY N/A 10/16/2020   Procedure: Revision decompression L2-3, L3-4;  Surgeon: Melina Schools, MD;  Location: [facility];  Service: Orthopedics;  Laterality: N/A;   POLYPECTOMY  02/21/2018   Procedure:  POLYPECTOMY;  Surgeon: Danie Binder, MD;  Location: [facility];  Service: Endoscopy;;  hepatic flexure polyp   SKIN CANCER EXCISION     basal cell cancer, arm/face   TOTAL KNEE ARTHROPLASTY Right 01/31/2017   Procedure: RIGHT TOTAL KNEE ARTHROPLASTY;  Surgeon: Paralee Cancel, MD;  Location: [facility];  Service: Orthopedics;  Laterality: Right;  90 mins   Social History   Socioeconomic History   Marital status: Married    Spouse name: Not on file   Number of children:  3   Years of education: Not on file   Highest education level: Not on file  Occupational History    + Homemaker, previously Buyer, retail strain: Not on file   Food insecurity    Worry: Not on file    Inability: Not on file   Transportation needs    Medical: Not on file    Non-medical: Not on file  Tobacco Use   Smoking status: Former Smoker    Packs/day: 1.00    Years: 8.00    Pack years: 8.00    Types: Cigarettes    Start date: 06/14/1970    Quit date: 06/14/1978    Years since quitting: 40.8   Smokeless tobacco: Never Used   Tobacco comment: quit age 1  Substance and Sexual Activity   Alcohol use: Yes    Alcohol/week:  1-2 drinks daily    Varies        Drug use: No   Current Outpatient Medications on File Prior to Visit  Medication Sig Dispense Refill   calcium carbonate (OSCAL) 1500 (600 Ca) MG TABS tablet Take by mouth daily.     Cholecalciferol (VITAMIN D-3) 125 MCG (5000 UT) TABS Take 10,000 Units by mouth daily.     Continuous Blood Gluc Transmit (DEXCOM G6 TRANSMITTER) MISC 1 Device by Does not apply route every 3 (three) months. 1 each 3   Dulaglutide (TRULICITY) 3 MH/9.6QI SOPN Inject 3 mg into the skin once a week. 6 mL 3   fluconazole (DIFLUCAN) 150 MG tablet Take 1 tablet (150 mg total) by mouth once as needed for up to 1 dose. 6 tablet 0   HYDROcodone-acetaminophen (NORCO/VICODIN) 5-325 MG tablet Take 1 tablet by mouth daily as needed.     insulin detemir  (LEVEMIR FLEXTOUCH) 100 UNIT/ML FlexPen Inject 55 Units into the skin at bedtime. 45 mL 3   Insulin Lispro-aabc (LYUMJEV KWIKPEN) 100 UNIT/ML SOPN Inject 8-12 units before meals under skin (Patient taking differently: Inject 8-10 units before meals under skin) 30 mL 3   Insulin Pen Needle (BD PEN NEEDLE NANO U/F) 32G X 4 MM MISC Inject 1 Units into the skin 3 (three) times daily. with insulin 300 each 3   Iron, Ferrous Sulfate, 325 (65 Fe) MG TABS Take 325 mg by mouth every other day. 30 tablet 0   Magnesium 500 MG CAPS Take 1 capsule by mouth daily.     melatonin 3 MG TABS tablet Take 3 mg by mouth at bedtime.     metFORMIN (GLUCOPHAGE) 1000 MG tablet Take 1 tablet (1,000 mg total) by mouth 2 (two) times daily. 180 tablet 3   NP THYROID 90 MG tablet Take 1 tablet (90 mg total) by mouth daily. 90 tablet 3   omeprazole (PRILOSEC) 20 MG capsule Take 1 capsule (20 mg total) by mouth every other day. 90 capsule 2   Polyethyl Glycol-Propyl Glycol (SYSTANE FREE OP) Place 1 drop into both eyes every 4 (four) hours as needed (Dry eyes).     rOPINIRole (REQUIP) 3 MG tablet Take 1 tablet (3 mg total) by mouth at bedtime. 90 tablet 2   rosuvastatin (CRESTOR) 20 MG tablet Take 1 tablet (20 mg total) by mouth daily. 90 tablet 2   vitamin B-12 (CYANOCOBALAMIN) 1000 MCG tablet Take 1,000 mcg by mouth daily.     No current facility-administered medications on file prior to visit.   Allergies  Allergen Reactions   Sulfa Antibiotics Hives and Other (See Comments)   Ace Inhibitors Cough and Other (See Comments)   Invokana [Canagliflozin] Other (See Comments)  Yeast    Vytorin [Ezetimibe-Simvastatin] Other (See Comments)    Leg cramps   Atorvastatin Hives and Other (See Comments)   Other Other (See Comments)   Family History  Problem Relation Age of Onset   Cancer Mother        breast   Heart disease Father    Stroke Father    Cancer Father        liver cancer had resection, had "hepatitis"   Heart  disease Brother    Hyperlipidemia Brother    Hyperlipidemia Brother    Colon cancer Neg Hx     PE: BP 118/72 (BP Location: Right Arm, Patient Position: Sitting, Cuff Size: Normal)   Pulse 92   Ht 5\' 3"  (1.6 m)   Wt 197 lb 12.8 oz (89.7 kg)   SpO2 97%   BMI 35.04 kg/m  Wt Readings from Last 3 Encounters:  04/22/21 197 lb 12.8 oz (89.7 kg)  04/03/21 201 lb 6.4 oz (91.4 kg)  03/24/21 203 lb 6.4 oz (92.3 kg)   Constitutional: overweight, in NAD Eyes: PERRLA, EOMI, no exophthalmos ENT: moist mucous membranes, no thyromegaly, no cervical lymphadenopathy Cardiovascular: tachycardia, RR, No MRG Respiratory: CTA B Gastrointestinal: abdomen soft, NT, ND, BS+ Musculoskeletal: no deformities, strength intact in all 4 Skin: moist, warm, no rashes Neurological: no tremor with outstretched hands, DTR normal in all 4  ASSESSMENT: 1. DM2, insulin-dependent, uncontrolled, without long-term complications, but with hyperglycemia  2.  Hypothyroidism -Previously uncontrolled  PLAN:  1. Patient with longstanding, uncontrolled, type 2 diabetes, on oral antidiabetic regimen with metformin but also weekly GLP-1 receptor agonist and basal-bolus insulin regimen, with improving blood sugar control.  At last visit HbA1c was 7.1%.  At that time, we did not change her regimen since sugars were mostly at goal except for when she returns from vacation when they were high.  She was planning to return to the previous diet and discussed about changing the dose of Lyumjev based on the size of her meal.  -At last visit she had a freestyle libre 2 CGM and she felt that this was not accurate for her so I sent the prescription for the Dexcom CGM to her pharmacy and gave her a list of suppliers in case the prescription had to go through a supplier. She now has a Dexcom.  CGM interpretation: -At today's visit, we reviewed her CGM downloads: It appears that 39.1% of values are in target range (goal >70%), while 60.4% are  higher than 180 (goal <25%), and 0.5% are lower than 70 (goal <4%).  The calculated average blood sugar is 199.  The projected HbA1c for the next 3 months (GMI) is 8.5%. -Reviewing the CGM trends, it appears that her sugars are mostly above target, with no significant increase in blood sugars after dinner after which the sugars do decrease but they increase again around 2 AM.  She is working on reducing her snacks.  She mentions that she is giving herself 10 to 12 units of Lyumjev at the start of the meal or after a few bites.  In this case, we discussed that her Lyumjev appears not to be enough.  Also, since her blood sugars are above target throughout the day, I did suggest to increase the Levemir slightly.  Ultimately, we can try to switch from Trulicity to Ozempic, for a more potent effect on her postprandial and fasting blood sugars.  She agrees with the above changes.  We will continue with metformin doses. -  I suggested to:  Patient Instructions  Please continue: - Metformin 2000 mg at night  Increase: - Levemir 60-63 units at bedtime - Lyumjev 12-14 units at the start of each meal  Try to switch from Trulicity to: - Ozempic 1 mg weekly  Please continue NP thyroid 90 mg in a.m.  Take the thyroid hormone every day, with water, at least 30 minutes before breakfast, separated by at least 4 hours from: - acid reflux medications - calcium - iron - multivitamins  Please return in 3-4 months.  - we checked her HbA1c: 7.6% (higher) - advised to check sugars at different times of the day - 4x a day, rotating check times - advised for yearly eye exams >> she is not UTD - return to clinic in 3-4 months  2.  Hypothyroidism - latest thyroid labs reviewed with pt. >> normal: Lab Results  Component Value Date   TSH 1.18 09/23/2020  - she continues on NP thyroid 90 mg daily but she is open to switch back to [place] if NP thyroid becomes unavailable - pt feels good on this dose. Lost 4 lbs >>  intentional. - we discussed about taking the thyroid hormone every day, with water, >30 minutes before breakfast, separated by >4 hours from acid reflux medications, calcium, iron, multivitamins. Pt. is taking it correctly. -We will recheck these at next visit.  Philemon Kingdom, MD PhD [facility]

## 2021-04-22 NOTE — Patient Instructions (Addendum)
Please continue: - Metformin 2000 mg at night  Increase: - Levemir 60-63 units at bedtime - Lyumjev 12-14 units at the start of each meal  Try to switch from Trulicity to: - Ozempic 1 mg weekly  Please continue NP thyroid 90 mg in a.m.  Take the thyroid hormone every day, with water, at least 30 minutes before breakfast, separated by at least 4 hours from: - acid reflux medications - calcium - iron - multivitamins  Please return in 3-4 months.

## 2021-05-20 ENCOUNTER — Encounter: Payer: Self-pay | Admitting: Internal Medicine

## 2021-06-01 ENCOUNTER — Encounter: Payer: Self-pay | Admitting: Nurse Practitioner

## 2021-06-03 ENCOUNTER — Other Ambulatory Visit: Payer: Self-pay | Admitting: Internal Medicine

## 2021-06-03 ENCOUNTER — Other Ambulatory Visit: Payer: Self-pay

## 2021-06-03 ENCOUNTER — Ambulatory Visit (INDEPENDENT_AMBULATORY_CARE_PROVIDER_SITE_OTHER): Payer: Medicare Other | Admitting: Dermatology

## 2021-06-03 DIAGNOSIS — Z808 Family history of malignant neoplasm of other organs or systems: Secondary | ICD-10-CM

## 2021-06-03 DIAGNOSIS — L821 Other seborrheic keratosis: Secondary | ICD-10-CM

## 2021-06-03 DIAGNOSIS — L918 Other hypertrophic disorders of the skin: Secondary | ICD-10-CM | POA: Diagnosis not present

## 2021-06-03 DIAGNOSIS — B351 Tinea unguium: Secondary | ICD-10-CM | POA: Diagnosis not present

## 2021-06-03 DIAGNOSIS — Z85828 Personal history of other malignant neoplasm of skin: Secondary | ICD-10-CM

## 2021-06-03 DIAGNOSIS — L570 Actinic keratosis: Secondary | ICD-10-CM | POA: Diagnosis not present

## 2021-06-03 DIAGNOSIS — Z1283 Encounter for screening for malignant neoplasm of skin: Secondary | ICD-10-CM

## 2021-06-03 DIAGNOSIS — A09 Infectious gastroenteritis and colitis, unspecified: Secondary | ICD-10-CM

## 2021-06-03 MED ORDER — CIPROFLOXACIN HCL 500 MG PO TABS: 500.0000 mg | ORAL_TABLET | Freq: Two times a day (BID) | ORAL | 0 refills | Status: AC

## 2021-06-03 NOTE — Patient Instructions (Signed)
Monitor the spot that was frozen today, if no improvement, please schedule an appt in about 3 months

## 2021-06-11 ENCOUNTER — Encounter: Payer: Self-pay | Admitting: Internal Medicine

## 2021-06-28 ENCOUNTER — Encounter: Payer: Self-pay | Admitting: Internal Medicine

## 2021-06-29 ENCOUNTER — Other Ambulatory Visit: Payer: Self-pay

## 2021-06-29 MED ORDER — METFORMIN HCL 1000 MG PO TABS: 1000.0000 mg | ORAL_TABLET | Freq: Two times a day (BID) | ORAL | 0 refills | Status: DC

## 2021-07-01 ENCOUNTER — Encounter: Payer: Self-pay | Admitting: Dermatology

## 2021-07-01 NOTE — Progress Notes (Signed)
° °  Follow-Up Visit   Subjective  Megan Church is a 66 y.o. female who presents for the following: Annual Exam (Patient here today for yearly skin check. Per patient she has a lesion on her right thigh x 1 week no bleeding, no pain. Personal history and family history of non mole skin cancer. No family history of atypical moles, melanoma. ).  General skin examination. Location:  Duration:  Quality:  Associated Signs/Symptoms: Modifying Factors:  Severity:  Timing: Context:   Objective  Well appearing patient in no apparent distress; mood and affect are within normal limits. Annual skin examination: No atypical pigmented lesions or nonmelanoma skin cancer.  Multiple 4 to 8 mm flattopped brown textured papules   Neck - Anterior Fleshy, skin-colored pedunculated papules.    Left Hallux Toe Nail Plate, Right Hallux Toe Nail Plate   Focal distal lifting and opacification compatible with chronic tinea  Right Thigh - Anterior Pink inflamed gritty 6 mm crust    A full examination was performed including scalp, head, eyes, ears, nose, lips, neck, chest, axillae, abdomen, back, buttocks, bilateral upper extremities, bilateral lower extremities, hands, feet, fingers, toes, fingernails, and toenails. All findings within normal limits unless otherwise noted below.  Areas beneath undergarments not fully examined.   Assessment & Plan    Screening exam for skin cancer  Annual skin examination, encouraged to self examine twice annually  Seborrheic keratosis  Benign, ok to leave unless pt wants removal or clinically changes  Skin tag Neck - Anterior  Benign, ok to leave unless pt wants removed   Tinea unguium (2) Left Hallux Toe Nail Plate; Right Hallux Toe Nail Plate  All treatment options reviewed but I told the patient quite frankly that I personally would not take long-term oral antifungals.  No intervention initiated.  Lichenoid keratosis Right Thigh -  Anterior  LN2 freeze.  Pt will monitor and schedule an appt in a few months if not smooth  Destruction of lesion - Right Thigh - Anterior Complexity: simple   Destruction method: cryotherapy   Informed consent: discussed and consent obtained   Timeout:  patient name, date of birth, surgical site, and procedure verified Lesion destroyed using liquid nitrogen: Yes   Cryotherapy cycles:  3 Outcome: patient tolerated procedure well with no complications   Post-procedure details: wound care instructions given        I, Lavonna Monarch, MD, have reviewed all documentation for this visit.  The documentation on 07/01/21 for the exam, diagnosis, procedures, and orders are all accurate and complete.

## 2021-08-04 ENCOUNTER — Encounter: Payer: Self-pay | Admitting: Internal Medicine

## 2021-08-04 DIAGNOSIS — E119 Type 2 diabetes mellitus without complications: Secondary | ICD-10-CM

## 2021-08-05 MED ORDER — LEVEMIR FLEXTOUCH 100 UNIT/ML ~~LOC~~ SOPN: 55.0000 [IU] | PEN_INJECTOR | Freq: Every day | SUBCUTANEOUS | 0 refills | Status: DC

## 2021-08-20 ENCOUNTER — Encounter: Payer: Self-pay | Admitting: Internal Medicine

## 2021-08-20 ENCOUNTER — Other Ambulatory Visit: Payer: Self-pay

## 2021-08-20 ENCOUNTER — Ambulatory Visit (INDEPENDENT_AMBULATORY_CARE_PROVIDER_SITE_OTHER): Payer: Medicare Other | Admitting: Internal Medicine

## 2021-08-20 VITALS — BP 120/70 | HR 88 | Ht 63.0 in | Wt 200.2 lb

## 2021-08-20 DIAGNOSIS — E119 Type 2 diabetes mellitus without complications: Secondary | ICD-10-CM | POA: Diagnosis not present

## 2021-08-20 DIAGNOSIS — E039 Hypothyroidism, unspecified: Secondary | ICD-10-CM

## 2021-08-20 DIAGNOSIS — E1165 Type 2 diabetes mellitus with hyperglycemia: Secondary | ICD-10-CM

## 2021-08-20 LAB — POCT GLYCOSYLATED HEMOGLOBIN (HGB A1C): Hemoglobin A1C: 7.6 % — AB (ref 4.0–5.6)

## 2021-08-20 MED ORDER — LEVEMIR FLEXTOUCH 100 UNIT/ML ~~LOC~~ SOPN: 63.0000 [IU] | PEN_INJECTOR | Freq: Every day | SUBCUTANEOUS | 3 refills | Status: DC

## 2021-08-20 MED ORDER — LYUMJEV KWIKPEN 100 UNIT/ML ~~LOC~~ SOPN: PEN_INJECTOR | SUBCUTANEOUS | 3 refills | Status: DC

## 2021-08-20 NOTE — Progress Notes (Signed)
Patient ID: Megan Church, female   DOB: 10-26-1955, 66 y.o.   MRN: [id number]   This visit occurred during the SARS-CoV-2 public health emergency.  Safety protocols were in place, including screening questions prior to the visit, additional usage of staff PPE, and extensive cleaning of exam room while observing appropriate contact time as indicated for disinfecting solutions.   HPI: Megan Church is a 66 y.o.-year-old female, initially referred by her PCP, Dr. Anastasio Champion, returning for follow-up for DM2, dx in 07/2008, insulin-dependent since 2012, then off, restarted 2020, uncontrolled, without long-term complications.  Last visit 3 months ago.  Interim history: No increased urination, blurry vision, nausea, chest pain.  She just returned from visiting [place] last month.  DM2: Reviewed HbA1c levels: Lab Results  Component Value Date   HGBA1C 7.6 (A) 04/22/2021   HGBA1C 7.1 (A) 12/31/2020   HGBA1C 8.5 (H) 10/14/2020   HGBA1C 8.6 (A) 09/23/2020   HGBA1C 8.4 (A) 08/08/2020   HGBA1C 7.2 (A) 02/14/2020   HGBA1C 7.7 (A) 10/18/2019   HGBA1C 7.0 (A) 07/19/2019   HGBA1C 8.7 (H) 04/03/2019   HGBA1C 8.4 (H) 02/20/2019   Pt is on a regimen of: - Metformin 1000 mg 2x a day, with meals >> 2000 mg with dinner - Trulicity 1.5 mg weekly >> 3 mg weekly (increased 03/2019) >> Ozempic 1 mg weekly (changed 04/2021) - Levemir 40 >> 50 >> 55 >> 60-63 units daily - Lyumjev 8-12 >> 10-12 >> 12-14 units before each meal  We stopped glipizide in 07/2020 and switched to Lyumjev. She tried Invokana >> yeast infections. She was on Januvia-stopped 03/2019  Pt was checking her sugars 4x a day with her CGM:   Previously:   Lowest sugar was 53 >>...70s >> 110  >> 39 (CGM) >> 49 (but feels this was an error) Highest sugar was 381 >> .Marland Kitchen.250 >> 240 >> 300s >> 300s.  Glucometer: [place]  Pt's meals are: - Breakfast: coffee, occasionally eggs - Lunch: tuna salad - Dinner: chicken, rice/potato,  salad - Snacks: 2 in the evening She is doing intermittent fasting -fasting window is in the morning.  She takes glipizide occasionally in the morning but mostly before lunch She is walking and gardening several times a week.  -No CKD, last BUN/creatinine:  Lab Results  Component Value Date   BUN 9 03/24/2021   BUN 10 11/26/2020   CREATININE 0.59 03/24/2021   CREATININE 0.54 11/26/2020  Not on ACE inhibitor/ARB.  -+ HL. Lab Results  Component Value Date   CHOL 161 03/24/2021   HDL 38.60 (L) 02/14/2020   LDLDIRECT 96.0 02/14/2020   TRIG 124 03/24/2021   CHOLHDL 4 02/14/2020  On Crestor 20.  - last eye exam was in 2022: No DR reportedly. She has a history of cataract surgeries.  -no numbness and tingling in her feet. On Neurontin for L leg pain 2/2 back pain. She had two back surgeries in the past.  Pt has FH of DM in paternal uncle.  Hypothyroidism  -Diagnosed "years" ago -On LT4 prev.,  now on desiccated thyroid extract (NP thyroid) -She was previously on a very large dose of NP thyroid, 150 mg daily, with suppressed TSH (also with fatigue, subjective hyperthermia, insomnia).  In the past she was having hot flushes on 105 mg of NP thyroid daily so we decreased to 90 mg daily.  She takes this: - in am - fasting - at least 30 min from b'fast - + calcium later in the  day - no iron - + multivitamins later in the day - no PPIs - not on Biotin  TFTs were reviewed: Lab Results  Component Value Date   TSH 1.18 09/23/2020   TSH 4.14 02/14/2020   TSH 3.08 11/27/2019   TSH 0.50 10/18/2019   TSH 1.52 07/19/2019   TSH 2.16 05/22/2019   TSH 0.08 (L) 04/17/2019   TSH 0.05 (L) 02/20/2019   She also has an isthmic thyroid nodule 1.8 cm >> this was biopsied in 2019 with benign results.  Pt denies: - feeling nodules in neck - hoarseness - dysphagia - choking - SOB with lying down  Parents have thyroid ds. No FH of thyroid cancer. No h/o radiation tx to head or neck. No  herbal supplements. No Biotin use. No recent steroids use.   She had a T12 vertebral compression fracture in 03/2020 after a fall. She had L fusion Sx 10/15/2020. She had PNA after the intubation. She continues to have low back pain -previously was in physical therapy. She was on testosterone compounded cream >> now off.  ROS: + see HPI  Past Medical History:  Diagnosis Date   Anemia    Anxiety    Arthritis    Cancer ([place])    basal cell carcinoma right arm and nose spots removed   Depression    GERD (gastroesophageal reflux disease)    HLD (hyperlipidemia) 02/20/2019   Morbid obesity ([place]) 02/20/2019   Primary hypothyroidism 02/20/2019   RLS (restless legs syndrome)    Shingles    Sleep apnea    Type II diabetes mellitus, uncontrolled 02/20/2019   diagnosed 2010   Vitamin D deficiency disease 02/20/2019   Past Surgical History:  Procedure Laterality Date   ANTERIOR LAT LUMBAR FUSION N/A 10/15/2020   Procedure: ANTERIOR LATERAL LUMBAR FUSION 2 LEVELS (XLIF) L2-4;  Surgeon: Melina Schools, MD;  Location: [facility];  Service: Orthopedics;  Laterality: N/A;  4 hrs   BACK SURGERY  2012, 2016   lumbar fusion 2012, cleaning out of area 2016   BREAST BIOPSY Left 2010   CHOLECYSTECTOMY  2011   CLOSED REDUCTION ANKLE FRACTURE     CLOSED REDUCTION HUMERUS FRACTURE Right 2014   COLONOSCOPY WITH PROPOFOL N/A 02/21/2018   Procedure: COLONOSCOPY WITH PROPOFOL;  Surgeon: Danie Binder, MD;  [facility];  Service: Endoscopy;  Laterality: N/A;  1:15pm - pt knows to arrive at 10:30   EYE SURGERY  2016   eye lid droop    KNEE ARTHROSCOPY Right 2010   LUMBAR LAMINECTOMY/DECOMPRESSION MICRODISCECTOMY N/A 10/16/2020   Procedure: Revision decompression L2-3, L3-4;  Surgeon: Melina Schools, MD;  Location: [facility];  Service: Orthopedics;  Laterality: N/A;   POLYPECTOMY  02/21/2018   Procedure: POLYPECTOMY;  Surgeon: Danie Binder, MD;  [facility];  Service: Endoscopy;;   hepatic flexure polyp   SKIN CANCER EXCISION     basal cell cancer, arm/face   TOTAL KNEE ARTHROPLASTY Right 01/31/2017   Procedure: RIGHT TOTAL KNEE ARTHROPLASTY;  Surgeon: Paralee Cancel, MD;  [facility];  Service: Orthopedics;  Laterality: Right;  90 mins   Social History   Socioeconomic History   Marital status: Married    Spouse name: Not on file   Number of children:  3   Years of education: Not on file   Highest education level: Not on file  Occupational History    + Homemaker, previously Licensed conveyancer  Social Needs   Financial resource strain: Not on  file   Food insecurity    Worry: Not on file    Inability: Not on file   Transportation needs    Medical: Not on file    Non-medical: Not on file  Tobacco Use   Smoking status: Former Smoker    Packs/day: 1.00    Years: 8.00    Pack years: 8.00    Types: Cigarettes    Start date: 06/14/1970    Quit date: 06/14/1978    Years since quitting: 40.8   Smokeless tobacco: Never Used   Tobacco comment: quit age 15  Substance and Sexual Activity   Alcohol use: Yes    Alcohol/week:  1-2 drinks daily    Varies        Drug use: No   Current Outpatient Medications on File Prior to Visit  Medication Sig Dispense Refill   calcium carbonate (OSCAL) 1500 (600 Ca) MG TABS tablet Take by mouth daily.     Cholecalciferol (VITAMIN D-3) 125 MCG (5000 UT) TABS Take 10,000 Units by mouth daily.     Continuous Blood Gluc Transmit (DEXCOM G6 TRANSMITTER) MISC 1 Device by Does not apply route every 3 (three) months. 1 each 3   fluconazole (DIFLUCAN) 150 MG tablet Take 1 tablet (150 mg total) by mouth once as needed for up to 1 dose. 6 tablet 0   insulin detemir (LEVEMIR FLEXTOUCH) 100 UNIT/ML FlexPen Inject 55 Units into the skin at bedtime. 45 mL 0   Insulin Lispro-aabc (LYUMJEV KWIKPEN) 100 UNIT/ML SOPN Inject 8-12 units before meals under skin (Patient taking differently: Inject 8-10 units before meals under skin) 30 mL 3   Insulin Pen  Needle (BD PEN NEEDLE NANO U/F) 32G X 4 MM MISC Inject 1 Units into the skin 3 (three) times daily. with insulin 300 each 3   Iron, Ferrous Sulfate, 325 (65 Fe) MG TABS Take 325 mg by mouth every other day. 30 tablet 0   Magnesium 500 MG CAPS Take 1 capsule by mouth daily.     melatonin 3 MG TABS tablet Take 3 mg by mouth at bedtime.     metFORMIN (GLUCOPHAGE) 1000 MG tablet Take 1 tablet (1,000 mg total) by mouth 2 (two) times daily. 180 tablet 0   NP THYROID 90 MG tablet Take 1 tablet (90 mg total) by mouth daily. 90 tablet 3   omeprazole (PRILOSEC) 20 MG capsule Take 1 capsule (20 mg total) by mouth every other day. 90 capsule 2   Polyethyl Glycol-Propyl Glycol (SYSTANE FREE OP) Place 1 drop into both eyes every 4 (four) hours as needed (Dry eyes).     rOPINIRole (REQUIP) 3 MG tablet Take 1 tablet (3 mg total) by mouth at bedtime. 90 tablet 2   rosuvastatin (CRESTOR) 20 MG tablet Take 1 tablet (20 mg total) by mouth daily. 90 tablet 2   Semaglutide, 1 MG/DOSE, (OZEMPIC, 1 MG/DOSE,) 4 MG/3ML SOPN Inject 1 mg into the skin once a week. 9 mL 3   vitamin B-12 (CYANOCOBALAMIN) 1000 MCG tablet Take 1,000 mcg by mouth daily.     No current facility-administered medications on file prior to visit.   Allergies  Allergen Reactions   Sulfa Antibiotics Hives and Other (See Comments)   Ace Inhibitors Cough and Other (See Comments)   Invokana [Canagliflozin] Other (See Comments)    Yeast    Vytorin [Ezetimibe-Simvastatin] Other (See Comments)    Leg cramps   Atorvastatin Hives and Other (See Comments)   Other Other (See Comments)  Family History  Problem Relation Age of Onset   Cancer Mother        breast   Heart disease Father    Stroke Father    Cancer Father        liver cancer had resection, had "hepatitis"   Heart disease Brother    Hyperlipidemia Brother    Hyperlipidemia Brother    Colon cancer Neg Hx     PE: BP 120/70 (BP Location: Right Arm, Patient Position: Sitting, Cuff  Size: Normal)    Pulse 88    Ht '5\' 3"'$  (1.6 m)    Wt 200 lb 3.2 oz (90.8 kg)    SpO2 98%    BMI 35.46 kg/m  Wt Readings from Last 3 Encounters:  08/20/21 200 lb 3.2 oz (90.8 kg)  04/22/21 197 lb 12.8 oz (89.7 kg)  04/03/21 201 lb 6.4 oz (91.4 kg)   Constitutional: overweight, in NAD Eyes: PERRLA, EOMI, no exophthalmos ENT: moist mucous membranes, no thyromegaly, no cervical lymphadenopathy Cardiovascular: tachycardia, RR, No MRG Respiratory: CTA B Musculoskeletal: no deformities, strength intact in all 4 Skin: moist, warm, no rashes Neurological: no tremor with outstretched hands, DTR normal in all 4 Diabetic Foot Exam - Simple   Simple Foot Form Diabetic Foot exam was performed with the following findings: Yes 08/20/2021  3:48 PM  Visual Inspection No deformities, no ulcerations, no other skin breakdown bilaterally: Yes Sensation Testing Intact to touch and monofilament testing bilaterally: Yes Pulse Check See comments: Yes Comments Post. Tibialis pulses not palpable due to periankle edema.    ASSESSMENT: 1. DM2, insulin-dependent, uncontrolled, without long-term complications, but with hyperglycemia  2.  Hypothyroidism -Previously uncontrolled  PLAN:  1. Patient with longstanding, uncontrolled, type 2 diabetes, on oral antidiabetic regimen with metformin, but also weekly GLP-1 receptor agonist and basal/bolus insulin regimen, with still poor control.  At last visit, sugars are mostly above target with a significant increase after dinner and then again around 2 AM.  At that time, I advised her to increase Levemir Lyumjev doses and switch from Trulicity to Ozempic for a stronger effect on her blood sugars.  HbA1c at that time was higher, at 7.6%, however, it was 8.5% as calculated from the CGM. -She previously had freestyle libre 2 CGM and she felt that this was not accurate for her so she now has a Dexcom.  CGM interpretation: -At today's visit, we reviewed her CGM downloads: It  appears that 40% of values are in target range (goal >70%), while 60% are higher than 180 (goal <25%), and 0% are lower than 70 (goal <4%).  The calculated average blood sugar is 196.  The projected HbA1c for the next 3 months (GMI) is 8.0%. -Reviewing the CGM trends, it appears that sugars are fluctuating around the upper limit of the target range.  She feels that his sugars are higher in the last 2 weeks compared to previously, as [place] of [place] and walking a lot. -We discussed that we have the option of increasing her basal insulin but she is already taking 63 units.  This was refilled for her.  Another option would be to increase Ozempic.  She agrees with this, but mentions that she still has quite a few pens of Trulicity at home and is wondering whether she could use them.  Therefore, instead of increasing the Ozempic dose I advised her to use Ozempic at the current dose and add a dose of Trulicity 3 mg  weekly until she runs out.  Afterwards, we can increase Ozempic to 2 mg weekly.  For now, I would suggest to continue the same dose of Lyumjev. - I suggested to:  Patient Instructions  Please continue: - Metformin 2000 mg at night - Levemir 63 units at bedtime - Lyumjev 12-14 units at the start of each meal - Ozempic 1 mg weekly  Please add: - Trulicity 3 mg weekly Let me know when you finish Trulicity to send the higher Ozempic dose to the pharmacy.  Please continue NP thyroid 90 mg in a.m.  Take the thyroid hormone every day, with water, at least 30 minutes before breakfast, separated by at least 4 hours from: - acid reflux medications - calcium - iron - multivitamins  Please stop at the lab.  Please return in 3-4 months.  - we checked her HbA1c: 7.6% (stable) - advised to check sugars at different times of the day - 4x a day, rotating check times - advised for yearly eye exams >> she is UTD - foot exam performed today - return to clinic in 3-4  months  2.  Hypothyroidism - latest thyroid labs reviewed with pt. >> normal: Lab Results  Component Value Date   TSH 1.18 09/23/2020  - she continues on NP thyroid 90 mg daily but she was open to switch back to [place] if NP becomes unavailable - pt feels good on this dose. - we discussed about taking the thyroid hormone every day, with water, >30 minutes before breakfast, separated by >4 hours from acid reflux medications, calcium, iron, multivitamins. Pt. is taking it correctly. - will check thyroid tests today: TSH, free T3 and fT4 - If labs are abnormal, she will need to return for repeat TFTs in 1.5 months  Component     Latest Ref Rng & Units 08/20/2021  Triiodothyronine,Free,Serum     2.3 - 4.2 pg/mL 4.5 (H)  TSH     0.35 - 5.50 uIU/mL 1.69  T4,Free(Direct)     0.60 - 1.60 ng/dL 0.73  TFTs are at goal.  Philemon Kingdom, MD PhD [facility] Endocrinology

## 2021-08-20 NOTE — Patient Instructions (Addendum)
Please continue: ?- Metformin 2000 mg at night ?- Levemir 63 units at bedtime ?- Lyumjev 12-14 units at the start of each meal ?- Ozempic 1 mg weekly ? ?Please add: ?- Trulicity 3 mg weekly ?Let me know when you finish Trulicity to send the higher Ozempic dose to the pharmacy. ? ?Please continue NP thyroid 90 mg in a.m. ? ?Take the thyroid hormone every day, with water, at least 30 minutes before breakfast, separated by at least 4 hours from: ?- acid reflux medications ?- calcium ?- iron ?- multivitamins ? ?Please stop at the lab. ? ?Please return in 3-4 months. ?

## 2021-08-21 LAB — T4, FREE: Free T4: 0.73 ng/dL (ref 0.60–1.60)

## 2021-08-21 LAB — T3, FREE: T3, Free: 4.5 pg/mL — ABNORMAL HIGH (ref 2.3–4.2)

## 2021-08-21 LAB — TSH: TSH: 1.69 u[IU]/mL (ref 0.35–5.50)

## 2021-08-28 ENCOUNTER — Encounter: Payer: Self-pay | Admitting: Dermatology

## 2021-09-01 ENCOUNTER — Ambulatory Visit: Payer: Medicare Other | Admitting: Dermatology

## 2021-09-03 ENCOUNTER — Ambulatory Visit (INDEPENDENT_AMBULATORY_CARE_PROVIDER_SITE_OTHER): Payer: Medicare Other | Admitting: Nurse Practitioner

## 2021-09-03 ENCOUNTER — Other Ambulatory Visit: Payer: Self-pay | Admitting: Nurse Practitioner

## 2021-09-03 ENCOUNTER — Other Ambulatory Visit: Payer: Self-pay

## 2021-09-03 ENCOUNTER — Telehealth: Payer: Self-pay | Admitting: *Deleted

## 2021-09-03 VITALS — BP 114/68 | HR 77 | Temp 97.8°F | Ht 63.0 in | Wt 199.2 lb

## 2021-09-03 DIAGNOSIS — E782 Mixed hyperlipidemia: Secondary | ICD-10-CM

## 2021-09-03 DIAGNOSIS — D509 Iron deficiency anemia, unspecified: Secondary | ICD-10-CM

## 2021-09-03 DIAGNOSIS — Z0001 Encounter for general adult medical examination with abnormal findings: Secondary | ICD-10-CM

## 2021-09-03 DIAGNOSIS — Z124 Encounter for screening for malignant neoplasm of cervix: Secondary | ICD-10-CM

## 2021-09-03 DIAGNOSIS — E559 Vitamin D deficiency, unspecified: Secondary | ICD-10-CM | POA: Diagnosis not present

## 2021-09-03 DIAGNOSIS — E673 Hypervitaminosis D: Secondary | ICD-10-CM

## 2021-09-03 LAB — CBC WITH DIFFERENTIAL/PLATELET
Basophils Absolute: 0 10*3/uL (ref 0.0–0.1)
Basophils Relative: 0.7 % (ref 0.0–3.0)
Eosinophils Absolute: 0.1 10*3/uL (ref 0.0–0.7)
Eosinophils Relative: 1.5 % (ref 0.0–5.0)
HCT: 37.1 % (ref 36.0–46.0)
Hemoglobin: 12.7 g/dL (ref 12.0–15.0)
Lymphocytes Relative: 47.3 % — ABNORMAL HIGH (ref 12.0–46.0)
Lymphs Abs: 2.8 10*3/uL (ref 0.7–4.0)
MCHC: 34.3 g/dL (ref 30.0–36.0)
MCV: 93 fl (ref 78.0–100.0)
Monocytes Absolute: 0.3 10*3/uL (ref 0.1–1.0)
Monocytes Relative: 4.8 % (ref 3.0–12.0)
Neutro Abs: 2.7 10*3/uL (ref 1.4–7.7)
Neutrophils Relative %: 45.7 % (ref 43.0–77.0)
Platelets: 167 10*3/uL (ref 150.0–400.0)
RBC: 3.98 Mil/uL (ref 3.87–5.11)
RDW: 14.7 % (ref 11.5–15.5)
WBC: 6 10*3/uL (ref 4.0–10.5)

## 2021-09-03 LAB — COMPREHENSIVE METABOLIC PANEL
ALT: 37 U/L — ABNORMAL HIGH (ref 0–35)
AST: 43 U/L — ABNORMAL HIGH (ref 0–37)
Albumin: 4.4 g/dL (ref 3.5–5.2)
Alkaline Phosphatase: 103 U/L (ref 39–117)
BUN: 10 mg/dL (ref 6–23)
CO2: 32 mEq/L (ref 19–32)
Calcium: 9.4 mg/dL (ref 8.4–10.5)
Chloride: 103 mEq/L (ref 96–112)
Creatinine, Ser: 0.54 mg/dL (ref 0.40–1.20)
GFR: 96.06 mL/min (ref >60.00)
Glucose, Bld: 155 mg/dL — ABNORMAL HIGH (ref 70–99)
Potassium: 4.1 mEq/L (ref 3.5–5.1)
Sodium: 141 mEq/L (ref 135–145)
Total Bilirubin: 0.9 mg/dL (ref 0.2–1.2)
Total Protein: 7.2 g/dL (ref 6.0–8.3)

## 2021-09-03 LAB — LIPID PANEL
Cholesterol: 154 mg/dL (ref 0–200)
HDL: 49.6 mg/dL (ref >39.00)
LDL Cholesterol: 84 mg/dL (ref 0–99)
NonHDL: 104.59
Total CHOL/HDL Ratio: 3
Triglycerides: 101 mg/dL (ref 0.0–149.0)
VLDL: 20.2 mg/dL (ref 0.0–40.0)

## 2021-09-03 LAB — IRON: Iron: 61 ug/dL (ref 42–145)

## 2021-09-03 LAB — FERRITIN: Ferritin: 74.9 ng/mL (ref 10.0–291.0)

## 2021-09-03 LAB — VITAMIN D 25 HYDROXY (VIT D DEFICIENCY, FRACTURES): VITD: 116.49 ng/mL (ref 30.00–100.00)

## 2021-09-03 NOTE — Assessment & Plan Note (Signed)
Lipid panel ordered today, further recommendations to be made based upon these results.  Patient is fasting today. ?

## 2021-09-03 NOTE — Assessment & Plan Note (Signed)
Serum vitamin D level ordered today, further recommendations may be made based upon these results. ?

## 2021-09-03 NOTE — Telephone Encounter (Signed)
CRITICAL VALUE STICKER ? ?CRITICAL VALUE: Vitamin D 116.49 ? ?RECEIVER (on-site recipient of call): Don'Quashia Hassell Done, [place] ? ?DATE & TIME NOTIFIED: 09/03/2021 @  ?3:54pm ? ?MESSENGER (representative from lab): [facility]  ? ?MD NOTIFIED: yes ? ?TIME OF NOTIFICATION: 3:54pm ? ?RESPONSE:  waiting on MD response  ?

## 2021-09-03 NOTE — Assessment & Plan Note (Signed)
CBC, iron level, and ferritin level ordered today.  Further recommendations may be made based upon these results. ?

## 2021-09-03 NOTE — Assessment & Plan Note (Signed)
Referral to OBGYN made today.  

## 2021-09-03 NOTE — Progress Notes (Signed)
? ? ? ?Subjective:  ?Patient ID: Megan Church, female    DOB: [birth year]  Age: 66 y.o. MRN: [id number] ? ?CC:  ?Chief Complaint  ?Patient presents with  ? Annual Exam  ?  ? ? ?HPI  ?This patient arrives today for the above. ? ?She has no acute concerns today.  She does report that she has a history of iron deficiency, and would like to have her iron levels checked as she continues on iron supplement every other day.  As far as health maintenance is concerned she is wondering if she needs Pap smears.  She does have a history of abnormal Pap smear but reports this was many years ago.  She does believe she has had at least 2 normal Pap smears within the last 10 years with the most recent Pap smear being normal.  She would still like to have Pap smear if possible for cervical cancer screening.  She will be due for mammogram in approximately 4 months.  Last DEXA scan was in 2004, and she would be agreeable to having this repeated for osteoporosis screening.  She is due for tetanus shot but would like to have this administered at pharmacy.  She does follow with endocrinology for her diabetes and had foot exam completed earlier this month.  She also had her diabetic eye exam completed last week.  Last colonoscopy was completed in 2019, per GI recommendation she should repeat in 5 to 10 years. ? ?Past Medical History:  ?Diagnosis Date  ? Anemia   ? Anxiety   ? Arthritis   ? Cancer [facility])   ? basal cell carcinoma right arm and nose spots removed  ? Depression   ? GERD (gastroesophageal reflux disease)   ? HLD (hyperlipidemia) 02/20/2019  ? Morbid obesity ([place]) 02/20/2019  ? Primary hypothyroidism 02/20/2019  ? RLS (restless legs syndrome)   ? Shingles   ? Sleep apnea   ? Type II diabetes mellitus, uncontrolled 02/20/2019  ? diagnosed 2010  ? Vitamin D deficiency disease 02/20/2019  ? ? ? ? ?Family History  ?Problem Relation Age of Onset  ? Cancer Mother   ?     breast  ? Heart disease Father   ? Stroke Father   ?  Cancer Father   ?     liver cancer had resection, had "hepatitis"  ? Heart disease Brother   ? Hyperlipidemia Brother   ? Hyperlipidemia Brother   ? Colon cancer Neg Hx   ? ? ?Social History  ? ?Social History Narrative  ? Married for 42 years.Moved from [place] in December as husband found new job.Used to be a librarian,retired.Husband a Health visitor from [place] retired.  ? ?Social History  ? ?Tobacco Use  ? Smoking status: Former  ?  Packs/day: 1.00  ?  Years: 8.00  ?  Pack years: 8.00  ?  Types: Cigarettes  ?  Start date: 06/14/1970  ?  Quit date: 06/14/1978  ?  Years since quitting: 43.2  ? Smokeless tobacco: Never  ? Tobacco comments:  ?  quit age 80  ?Substance Use Topics  ? Alcohol use: Yes  ?  Alcohol/week: 14.0 standard drinks  ?  Types: 7 Glasses of wine, 7 Shots of liquor per week  ?  Comment: 2 mixed drinks/day  ? ? ? ?Current Meds  ?Medication Sig  ? calcium carbonate (OSCAL) 1500 (600 Ca) MG TABS tablet Take by mouth daily.  ? Cholecalciferol (VITAMIN D-3) 125 MCG (5000 UT) TABS  Take 10,000 Units by mouth daily.  ? Continuous Blood Gluc Transmit (DEXCOM G6 TRANSMITTER) MISC 1 Device by Does not apply route every 3 (three) months.  ? fluconazole (DIFLUCAN) 150 MG tablet Take 1 tablet (150 mg total) by mouth once as needed for up to 1 dose.  ? insulin detemir (LEVEMIR FLEXTOUCH) 100 UNIT/ML FlexPen Inject 63 Units into the skin at bedtime.  ? Insulin Lispro-aabc (LYUMJEV KWIKPEN) 100 UNIT/ML KwikPen Inject 12-14 units 3x a day before meals under skin  ? Insulin Pen Needle (BD PEN NEEDLE NANO U/F) 32G X 4 MM MISC Inject 1 Units into the skin 3 (three) times daily. with insulin  ? Iron, Ferrous Sulfate, 325 (65 Fe) MG TABS Take 325 mg by mouth every other day.  ? Magnesium 500 MG CAPS Take 1 capsule by mouth daily.  ? melatonin 3 MG TABS tablet Take 3 mg by mouth at bedtime.  ? metFORMIN (GLUCOPHAGE) 1000 MG tablet Take 1 tablet (1,000 mg total) by mouth 2 (two) times daily.  ? NP THYROID 90 MG  tablet Take 1 tablet (90 mg total) by mouth daily.  ? omeprazole (PRILOSEC) 20 MG capsule Take 1 capsule (20 mg total) by mouth every other day.  ? Polyethyl Glycol-Propyl Glycol (SYSTANE FREE OP) Place 1 drop into both eyes every 4 (four) hours as needed (Dry eyes).  ? rOPINIRole (REQUIP) 3 MG tablet Take 1 tablet (3 mg total) by mouth at bedtime.  ? rosuvastatin (CRESTOR) 20 MG tablet Take 1 tablet (20 mg total) by mouth daily.  ? Semaglutide, 1 MG/DOSE, (OZEMPIC, 1 MG/DOSE,) 4 MG/3ML SOPN Inject 1 mg into the skin once a week.  ? vitamin B-12 (CYANOCOBALAMIN) 1000 MCG tablet Take 1,000 mcg by mouth daily.  ? ? ?ROS:  ?Review of Systems  ?Respiratory:  Negative for shortness of breath.   ?Cardiovascular:  Negative for chest pain.  ?Gastrointestinal:  Negative for abdominal pain and blood in stool.  ?Neurological:  Negative for dizziness, seizures and loss of consciousness.  ? ? ?Objective:  ? ?Today's Vitals: BP 114/68   Pulse 77   Temp 97.8 ?F (36.6 ?C) (Oral)   Ht '5\' 3"'$  (1.6 m)   Wt 199 lb 4 oz (90.4 kg)   SpO2 96%   BMI 35.30 kg/m?  ? ?  09/03/2021  ? 10:49 AM 08/20/2021  ?  3:30 PM 04/22/2021  ? 11:13 AM  ?Vitals with BMI  ?Height '5\' 3"'$  '5\' 3"'$  '5\' 3"'$   ?Weight 199 lbs 4 oz 200 lbs 3 oz 197 lbs 13 oz  ?BMI 35.3 35.47 35.05  ?Systolic 329 518 841  ?Diastolic 68 70 72  ?Pulse 77 88 92  ?  ? ?Physical Exam ? ? ? ? ? ? ?Assessment and Plan  ? ?1. Encounter for general adult medical examination with abnormal findings   ?2. Iron deficiency anemia, unspecified iron deficiency anemia type   ?3. Vitamin D deficiency disease   ?4. Mixed hyperlipidemia   ?5. Cervical cancer screening   ? ? ? ?Plan: ?See plan via problem list below. ? ? ? ?Tests ordered ?Orders Placed This Encounter  ?Procedures  ? Lipid panel  ? Comprehensive metabolic panel  ? CBC with Differential/Platelet  ? Iron  ? Ferritin  ? Vitamin D (25 hydroxy)  ? Ambulatory referral to Obstetrics / Gynecology  ? ? ? ? ?No orders of the defined types were placed  in this encounter. ? ? ?Patient to follow-up in 3 to 6 months, or  sooner as needed. ? ?Ailene Ards, NP ? ?

## 2021-09-03 NOTE — Assessment & Plan Note (Signed)
Due for mammogram in July 2024, due for colonoscopy in September 2024.  She will continue follow-up with endocrinology regarding her routine diabetes care and screenings.  She will go to pharmacy to have tetanus shot administered.  She would like referral to OB/GYN to have Pap smear completed, we did discuss that she may stop these if she would like based on USPSTF recommendations however she would prefer to continue screening for now.  Referral to OB/GYN made today. ?

## 2021-09-03 NOTE — Patient Instructions (Addendum)
Due for screening Mammogram anytime after 12/13/22 ?Review your records to see if you have had a bone density scan (DEXA) anywhere else in the last 2 years ?

## 2021-09-03 NOTE — Progress Notes (Signed)
Repeat vit D and cmp ?

## 2021-09-04 ENCOUNTER — Encounter: Payer: Self-pay | Admitting: *Deleted

## 2021-09-04 NOTE — Telephone Encounter (Signed)
Called patient and left message in regards to her lab results. Will notify patient via Mychart as well  ?

## 2021-09-11 ENCOUNTER — Ambulatory Visit: Payer: PRIVATE HEALTH INSURANCE | Admitting: Gastroenterology

## 2021-09-14 ENCOUNTER — Encounter: Payer: Self-pay | Admitting: Nurse Practitioner

## 2021-09-22 ENCOUNTER — Other Ambulatory Visit (INDEPENDENT_AMBULATORY_CARE_PROVIDER_SITE_OTHER): Payer: Self-pay | Admitting: Nurse Practitioner

## 2021-09-22 DIAGNOSIS — E782 Mixed hyperlipidemia: Secondary | ICD-10-CM

## 2021-09-23 ENCOUNTER — Other Ambulatory Visit: Payer: Self-pay | Admitting: Nurse Practitioner

## 2021-09-23 DIAGNOSIS — H9190 Unspecified hearing loss, unspecified ear: Secondary | ICD-10-CM

## 2021-09-23 NOTE — Progress Notes (Signed)
Referral to audiology for hearing test per patient request ? ?

## 2021-09-24 ENCOUNTER — Encounter: Payer: Self-pay | Admitting: Nurse Practitioner

## 2021-09-24 ENCOUNTER — Encounter: Payer: Self-pay | Admitting: Internal Medicine

## 2021-09-24 DIAGNOSIS — E1165 Type 2 diabetes mellitus with hyperglycemia: Secondary | ICD-10-CM

## 2021-09-24 MED ORDER — OZEMPIC (2 MG/DOSE) 8 MG/3ML ~~LOC~~ SOPN: 2.0000 mg | PEN_INJECTOR | SUBCUTANEOUS | 1 refills | Status: DC

## 2021-09-29 ENCOUNTER — Ambulatory Visit: Payer: Medicare Other | Attending: Audiologist | Admitting: Audiologist

## 2021-09-29 DIAGNOSIS — H903 Sensorineural hearing loss, bilateral: Secondary | ICD-10-CM | POA: Insufficient documentation

## 2021-09-29 NOTE — Procedures (Signed)
?  [facility] ?[address] ?[place], [place]  [address] ?[phone] ? ?AUDIOLOGICAL  EVALUATION ? ?NAME: Megan Church     ?DOB:   1955-11-19      ?MRN: [id number]                                                                                     ?DATE: 09/29/2021     ?REFERENT: Ailene Ards, NP ?STATUS: Outpatient ?DIAGNOSIS: Sensorineural Hearing Loss Bilateral   ? ?History: ?Megan Church was seen for an audiological evaluation due to family report of difficulty hearing. Daune says she thinks she hears pretty well over all, but has difficulty in noise. Her husband feels she is not hearing well. No history of ear infections. 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 No pain, pressure, or ringing in the ears. No other concerns or case history reported. Medical history shows history of diabetes which can be a risk factor for hearing loss.  ? ?Evaluation:  ?Otoscopy showed a clear view of the tympanic membranes, bilaterally ?Tympanometry results were consistent with normal middle ear pressure. ?Audiometric testing was completed using Conventional Audiometry techniques with insert earphones and high frequency supraural headphones. Test results are consistent with normal hearing sloping after 1 kHz to a moderate sensorineural hearing loss in each ear by 8 kHZ. Speech Recognition Thresholds were obtained at 30 dB HL in the right ear and at 30  dB HL in the left ear which is consistent with PTA. Word Recognition Testing was completed at 40 dB SL and Megan Church scored 100% in each ear.   ? ?Results:  ?The test results were reviewed with Megan Church. She has a moderate hearing loss in each ear likely from age related changes to hearing. Recommend she start having her hearing tested each ear.  She is a hearing aid candidate if she feels she is impacted by the loss daily. Currently she only has some difficulty in noise. She should hear well when people are face to face within five  feet.  ? ?Recommendations: ?1.   Annual audiometric testing to monitor hearing loss.  ? ?If you have any questions please feel free to contact me at [phone]. ? ?Lorenza Evangelist, [place].  ?Audiology Intern  ? ?Alfonse Alpers  ?Audiologist, Au.D., CCC-A ?09/29/2021  11:02 AM ? ?Cc: Ailene Ards, NP ? ?

## 2021-10-01 ENCOUNTER — Encounter: Payer: Self-pay | Admitting: Internal Medicine

## 2021-10-02 ENCOUNTER — Other Ambulatory Visit: Payer: Self-pay | Admitting: Nurse Practitioner

## 2021-10-02 ENCOUNTER — Encounter: Payer: Self-pay | Admitting: Nurse Practitioner

## 2021-10-02 DIAGNOSIS — E782 Mixed hyperlipidemia: Secondary | ICD-10-CM

## 2021-10-02 DIAGNOSIS — E559 Vitamin D deficiency, unspecified: Secondary | ICD-10-CM

## 2021-10-02 NOTE — Addendum Note (Signed)
Addended by: [name] E on: 10/02/2021 12:36 PM ? ? Modules accepted: Orders ? ?

## 2021-10-02 NOTE — Telephone Encounter (Signed)
Pt addressed in previous encounter. ?

## 2021-10-03 ENCOUNTER — Encounter: Payer: Self-pay | Admitting: Internal Medicine

## 2021-10-03 ENCOUNTER — Encounter: Payer: Self-pay | Admitting: Nurse Practitioner

## 2021-10-03 ENCOUNTER — Other Ambulatory Visit: Payer: Self-pay | Admitting: Internal Medicine

## 2021-10-03 DIAGNOSIS — E1165 Type 2 diabetes mellitus with hyperglycemia: Secondary | ICD-10-CM

## 2021-10-05 MED ORDER — OZEMPIC (2 MG/DOSE) 8 MG/3ML ~~LOC~~ SOPN: 2.0000 mg | PEN_INJECTOR | SUBCUTANEOUS | 1 refills | Status: AC

## 2021-10-05 MED ORDER — METFORMIN HCL 1000 MG PO TABS: 1000.0000 mg | ORAL_TABLET | Freq: Two times a day (BID) | ORAL | 0 refills | Status: DC

## 2021-10-06 ENCOUNTER — Encounter: Payer: Self-pay | Admitting: Gastroenterology

## 2021-10-06 ENCOUNTER — Ambulatory Visit (INDEPENDENT_AMBULATORY_CARE_PROVIDER_SITE_OTHER): Payer: Medicare Other | Admitting: Gastroenterology

## 2021-10-06 VITALS — BP 128/62 | HR 90 | Temp 97.1°F | Ht 63.0 in | Wt 202.2 lb

## 2021-10-06 DIAGNOSIS — K760 Fatty (change of) liver, not elsewhere classified: Secondary | ICD-10-CM

## 2021-10-06 DIAGNOSIS — R932 Abnormal findings on diagnostic imaging of liver and biliary tract: Secondary | ICD-10-CM

## 2021-10-06 DIAGNOSIS — R7989 Other specified abnormal findings of blood chemistry: Secondary | ICD-10-CM

## 2021-10-06 LAB — BASIC METABOLIC PANEL
BUN: 10 mg/dL (ref 7–25)
CO2: 31 mmol/L (ref 20–32)
Calcium: 9.4 mg/dL (ref 8.6–10.4)
Chloride: 100 mmol/L (ref 98–110)
Creat: 0.52 mg/dL (ref 0.50–1.05)
Glucose, Bld: 201 mg/dL — ABNORMAL HIGH (ref 65–99)
Potassium: 4.2 mmol/L (ref 3.5–5.3)
Sodium: 139 mmol/L (ref 135–146)

## 2021-10-06 LAB — VITAMIN D 25 HYDROXY (VIT D DEFICIENCY, FRACTURES): Vit D, 25-Hydroxy: 62 ng/mL (ref 30–100)

## 2021-10-06 NOTE — Patient Instructions (Addendum)
Continue to work hard to control your diabetes. ?Weight loss of 10 pounds over the next 3 months advised.  This would benefit both your liver health as well as her overall health. ?Try to get as much exercise as you are able to, even walking 15 to 20 minutes daily can be beneficial. ?Labs as ordered.  We will be in touch with results as available. ?Return office visit in 6 months.  Call sooner if you have any questions or concerns.  Call if you note any increasing swelling, abdominal swelling, skin color changes, intense itching. ? ? ?Fatty Liver Disease ? ?The liver converts food into energy, removes toxic material from the blood, makes important proteins, and absorbs necessary vitamins from food. Fatty liver disease occurs when too much fat has built up in your liver cells. Fatty liver disease is also called hepatic steatosis. ?In many cases, fatty liver disease does not cause symptoms or problems. It is often diagnosed when tests are being done for other reasons. However, over time, fatty liver can cause inflammation that may lead to more serious liver problems, such as scarring of the liver (cirrhosis) and liver failure. ?Fatty liver is associated with insulin resistance, increased body fat, high blood pressure (hypertension), and high cholesterol. These are features of metabolic syndrome and increase your risk for stroke, diabetes, and heart disease. ?What are the causes? ?This condition may be caused by components of metabolic syndrome: ?Obesity. ?Insulin resistance. ?High cholesterol. ?Other causes: ?Alcohol abuse. ?Poor nutrition. ?Cushing syndrome. ?Pregnancy. ?Certain drugs. ?Poisons. ?Some viral infections. ?What increases the risk? ?You are more likely to develop this condition if you: ?Abuse alcohol. ?Are overweight. ?Have diabetes. ?Have hepatitis. ?Have a high triglyceride level. ?Are pregnant. ?What are the signs or symptoms? ?Fatty liver disease often does not cause symptoms. If symptoms do develop,  they can include: ?Fatigue and weakness. ?Weight loss. ?Confusion. ?Nausea, vomiting, or abdominal pain. ?Yellowing of your skin and the white parts of your eyes (jaundice). ?Itchy skin. ?How is this diagnosed? ?This condition may be diagnosed by: ?A physical exam and your medical history. ?Blood tests. ?Imaging tests, such as an ultrasound, CT scan, or MRI. ?A liver biopsy. A small sample of liver tissue is removed using a needle. The sample is then looked at under a microscope. ?How is this treated? ?Fatty liver disease is often caused by other health conditions. Treatment for fatty liver may involve medicines and lifestyle changes to manage conditions such as: ?Alcoholism. ?High cholesterol. ?Diabetes. ?Being overweight or obese. ?Follow these instructions at home: ? ?Do not drink alcohol. If you have trouble quitting, ask your health care provider how to safely quit with the help of medicine or a supervised program. This is important to keep your condition from getting worse. ?Eat a healthy diet as told by your health care provider. Ask your health care provider about working with a dietitian to develop an eating plan. ?Exercise regularly. This can help you lose weight and control your cholesterol and diabetes. Talk to your health care provider about an exercise plan and which activities are best for you. ?Take over-the-counter and prescription medicines only as told by your health care provider. ?Keep all follow-up visits. This is important. ?Contact a health care provider if: ?You have trouble controlling your: ?Blood sugar. This is especially important if you have diabetes. ?Cholesterol. ?Drinking of alcohol. ?Get help right away if: ?You have abdominal pain. ?You have jaundice. ?You have nausea and are vomiting. ?You vomit blood or material that looks  like coffee grounds. ?You have stools that are black, tar-like, or bloody. ?Summary ?Fatty liver disease develops when too much fat builds up in the cells of  your liver. ?Fatty liver disease often causes no symptoms or problems. However, over time, fatty liver can cause inflammation that may lead to more serious liver problems, such as scarring of the liver (cirrhosis). ?You are more likely to develop this condition if you abuse alcohol, are pregnant, are overweight, have diabetes, have hepatitis, or have high triglyceride or cholesterol levels. ?Contact your health care provider if you have trouble controlling your blood sugar, cholesterol, or drinking of alcohol. ?This information is not intended to replace advice given to you by your health care provider. Make sure you discuss any questions you have with your health care provider. ?Document Revised: 03/13/2020 Document Reviewed: 03/13/2020 ?Elsevier Patient Education ? [place]. ? ? ?Nonalcoholic Fatty Liver Disease Diet, Adult ?Nonalcoholic fatty liver disease is a condition that causes fat to build up in and around the liver. The disease makes it harder for the liver to work the way that it should. Following a healthy diet can help to keep nonalcoholic fatty liver disease under control. It can also help to prevent or improve conditions that are associated with the disease, such as heart disease, diabetes, high blood pressure, and abnormal cholesterol levels. ?Along with regular exercise, this diet: ?Promotes weight loss. ?Helps to control blood sugar levels. ?Helps to improve the way that the body uses insulin. ?What are tips for following this plan? ?Reading food labels ?Always check food labels for: ?The amount of saturated fat in a food. You should limit your intake of saturated fat. Saturated fat is found in foods that come from animals, including meat and dairy products such as butter, cheese, and whole milk. ?The amount of fiber in a food. You should choose high-fiber foods such as fruits, vegetables, and whole grains. Try to get 25-30 grams (g) of fiber a day. ? ?Cooking ?When cooking, use  heart-healthy oils that are high in monounsaturated fats. These include olive oil, canola oil, and avocado oil. ?Limit frying or deep-frying foods. Cook foods using healthy methods such as baking, boiling, steaming, and grilling instead. ?Meal planning ?You may want to keep track of how many calories you take in. Eating the right amount of calories will help you achieve a healthy weight. Meeting with a registered dietitian can help you get started. ?Limit how often you eat takeout and fast food. These foods are usually very high in fat, salt, and sugar. ?Use the glycemic index (GI) to plan your meals. The index tells you how quickly a food will raise your blood sugar. Choose low-GI foods (GI less than 55). These foods take a longer time to raise blood sugar. A registered dietitian can help you identify foods lower on the GI scale. ?Lifestyle ?You may want to follow a Mediterranean diet. This diet includes a lot of vegetables, lean meats or fish, whole grains, fruits, and healthy oils and fats. ?What foods can I eat? ? ?Fruits ?Bananas. Apples. Oranges. Grapes. Papaya. Mango. Pomegranate. Kiwi. Grapefruit. Cherries. ?Vegetables ?Lettuce. Spinach. Peas. Beets. Cauliflower. Cabbage. Broccoli. Carrots. Tomatoes. Squash. Eggplant. Herbs. Peppers. Onions. Cucumbers. Brussels sprouts. Yams and sweet potatoes. Beans. Lentils. ?Grains ?Whole wheat or whole-grain foods, including breads, crackers, cereals, and pasta. Stone-ground whole wheat. Unsweetened oatmeal. Bulgur. Barley. Quinoa. Brown or wild rice. Corn or whole wheat flour tortillas. ?Meats and other proteins ?Lean meats. Poultry. Tofu. Seafood and shellfish. ?Dairy ?  Low-fat or fat-free dairy products, such as yogurt, cottage cheese, or cheese. ?Beverages ?Water. Sugar-free drinks. Tea. Coffee. Low-fat or skim milk. Milk alternatives, such as soy or almond milk. Real fruit juice. ?Fats and oils ?Avocado. Canola or olive oil. Nuts and nut butters. Seeds. ?Seasonings  and condiments ?Mustard. Relish. Low-fat, low-sugar ketchup and barbecue sauce. Low-fat or fat-free mayonnaise. ?Sweets and desserts ?Sugar-free sweets. ?The items listed above may not be a complete list of food

## 2021-10-06 NOTE — Progress Notes (Signed)
? ? ? ?GI Office Note   ? ?Referring Provider: Ailene Ards, NP ?Primary Care Physician:  Ailene Ards, NP  ?Primary Gastroenterologist: Elon Alas. Abbey Chatters, DO ? ? ?Chief Complaint  ? ?Chief Complaint  ?Patient presents with  ? Follow-up  ?  No current issues to discuss.   ? ? ?History of Present Illness  ? ?Megan Church is a 66 y.o. female presenting today for follow-up.  Last seen in October 2022.  Patient was seen at that time for prominent caudate lobe with subtle irregular hepatic capsule findings concerning for cirrhosis/hepatic steatosis noted on ultrasound performed for right upper quadrant pain occurring after back surgery in May 2022.  Also noted to have elevated LFTs dating back to September 2020. ? ?She completed extensive labs since her last visit.  FibroSure test showed F1-F2 suggesting mild to moderate fibrosis.  Her LFTs at that time were mildly elevated as outlined below.  No evidence of thrombocytopenia.  ANA negative, hepatitis A antibody positive, hepatitis B surface antibody nonreactive, hepatitis B surface antigen negative, hepatitis C antibody negative, immunoglobulins unremarkable with only minimally elevated IgA, smooth muscle antibody negative, AMA negative, iron/ferritin normal, celiac serologies negative (TTG IgA, serum IgA).  We suggested hepatitis B vaccination. ? ?Today: Patient states that she has received 2 of her hepatitis B shots although her second shot was late.  She is scheduled for her third shot.  Overall feels well.  No abdominal pain.  Appetite is good.  No blood in the stool or melena.  No constipation.  No upper GI symptoms.  No pruritus.  Her father had a history of primary liver cancer. ? 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   ?Colonoscopy September 2019: ?-One 5 mm polyp at the hepatic flexure, removed with a cold snare. Resected and ?retrieved.  Sessile serrated polyp ?- MODERATE Diverticulosis in the entire examined colon. ?- Redundant LEFT colon. ?- External and internal hemorrhoids. ?-Colonoscopy in 5 years ? ?Medications  ? ?Current Outpatient Medications  ?Medication Sig Dispense Refill  ? calcium carbonate (OSCAL) 1500 (600 Ca) MG TABS tablet Take by mouth daily.    ? Cholecalciferol (VITAMIN D-3) 125 MCG (5000 UT) TABS Take 10,000 Units by mouth daily.    ? Continuous Blood Gluc Transmit (DEXCOM G6 TRANSMITTER) MISC 1 Device by Does not apply route every 3 (three) months. 1 each 3  ? insulin detemir (LEVEMIR FLEXTOUCH) 100 UNIT/ML FlexPen Inject 63 Units into the skin at bedtime. 45 mL 3  ? Insulin Lispro-aabc (LYUMJEV KWIKPEN) 100 UNIT/ML KwikPen Inject 12-14 units 3x a day before meals under skin 30 mL 3  ? Insulin Pen Needle (BD PEN NEEDLE NANO U/F) 32G X 4 MM MISC Inject 1 Units into the skin 3 (three) times daily. with insulin 300 each 3  ? Iron, Ferrous Sulfate, 325 (65 Fe) MG TABS Take 325 mg by mouth every other day. 30 tablet 0  ? Magnesium 500 MG CAPS Take 1 capsule by mouth daily.    ? melatonin 3 MG TABS tablet Take 3 mg by mouth at bedtime.    ? metFORMIN (GLUCOPHAGE) 1000 MG tablet Take 1 tablet (1,000 mg total) by mouth 2 (two) times daily. (Patient taking differently: Take 1,000 mg by mouth 2 (two) times daily with a meal. Takes 2,000 mg at  dinner) 180 tablet 0  ? NP THYROID 90 MG tablet Take 1 tablet (90 mg total) by mouth daily. 90 tablet 3  ? omeprazole (PRILOSEC) 20 MG capsule Take 1 capsule (20 mg total) by mouth every other day. 90 capsule 2  ? Polyethyl Glycol-Propyl Glycol (SYSTANE FREE OP) Place 1 drop into both eyes every 4 (four) hours as needed (Dry eyes).    ? rOPINIRole (REQUIP) 3 MG tablet Take 1 tablet (3 mg total) by mouth at bedtime. 90 tablet 2  ? rosuvastatin (CRESTOR) 20 MG tablet Take 1  tablet (20 mg total) by mouth daily. 90 tablet 2  ? Semaglutide, 2 MG/DOSE, (OZEMPIC, 2 MG/DOSE,) 8 MG/3ML SOPN Inject 2 mg into the skin once a week. 9 mL 1  ? vitamin B-12 (CYANOCOBALAMIN) 1000 MCG tablet Take 1,000 mcg by mouth daily.    ? ?No current facility-administered medications for this visit.  ? ? ?Allergies  ? ?Allergies as of 10/06/2021 - Review Complete 10/06/2021  ?Allergen Reaction Noted  ? Sulfa antibiotics Hives and Other (See Comments) 07/24/2016  ? Ace inhibitors Cough and Other (See Comments) 01/20/2017  ? Invokana [canagliflozin] Other (See Comments) 01/20/2017  ? Vytorin [ezetimibe-simvastatin] Other (See Comments) 01/20/2017  ? Atorvastatin Hives and Other (See Comments) 07/24/2016  ? Other Other (See Comments) 11/26/2020  ?  ? ?Review of Systems  ? ?General: Negative for anorexia, weight loss, fever, chills, fatigue, weakness. ?ENT: Negative for hoarseness, difficulty swallowing , nasal congestion. ?CV: Negative for chest pain, angina, palpitations, dyspnea on exertion, peripheral edema.  ?Respiratory: Negative for dyspnea at rest, dyspnea on exertion, cough, sputum, wheezing.  ?GI: See history of present illness. ?GU:  Negative for dysuria, hematuria, urinary incontinence, urinary frequency, nocturnal urination.  ?Endo: Negative for unusual weight change.  ?   ?Physical Exam  ? ?BP 128/62 (BP Location: Right Arm, Patient Position: Sitting, Cuff Size: Normal)   Pulse 90   Temp (!) 97.1 ?F (36.2 ?C) (Temporal)   Ht 5' 3" (1.6 m)   Wt 202 lb 3.2 oz (91.7 kg)   SpO2 98%   BMI 35.82 kg/m?  ?  ?General: Well-nourished, well-developed in no acute distress.  ?Eyes: No icterus. ?Mouth: Oropharyngeal mucosa moist and pink , no lesions erythema or exudate. ?Lungs: Clear to auscultation bilaterally.  ?Heart: Regular rate and rhythm, no murmurs rubs or gallops.  ?Abdomen: Bowel sounds are normal, nontender, nondistended, no hepatosplenomegaly or masses,  ?no abdominal bruits or hernia , no rebound  or guarding. Liver edge easily palpated in the epigastric region.  ?Rectal: not performed  ?Extremities: No lower extremity edema. No clubbing or deformities. ?Neuro: Alert and oriented x 4   ?Skin: Warm and dry, no jaundice.   ?Psych: Alert and cooperative, normal mood and affect. ? ?Labs  ? ?Lab Results  ?Component Value Date  ? CREATININE 0.54 09/03/2021  ? BUN 10 09/03/2021  ? NA 141 09/03/2021  ? K 4.1 09/03/2021  ? CL 103 09/03/2021  ? CO2 32 09/03/2021  ? ?Lab Results  ?Component Value Date  ? ALT 37 (H) 09/03/2021  ? AST 43 (H) 09/03/2021  ? ALKPHOS 103 09/03/2021  ? BILITOT 0.9 09/03/2021  ? ?Lab Results  ?Component Value Date  ? WBC 6.0 09/03/2021  ? HGB 12.7 09/03/2021  ? HCT 37.1 09/03/2021  ? MCV 93.0 09/03/2021  ? PLT 167.0 09/03/2021  ? ?Lab Results  ?Component Value Date  ? INR 1.0 03/24/2021  ? INR 1.3 (H) 10/17/2020  ? INR  1.1 10/14/2020  ? ? ?Imaging Studies  ? ?No results found. ? ?Assessment  ? ?Abnormal liver: concern for cirrhosis on u/s with enlarged caudate lobe and somewhat nodular border. Chronically elevated transaminases. And more recently elevated alk phos as well. Fibrosure last year showed F1-F2 fibrosis with marked or severe steatosis. Recommend etoh cessation. Tight glycemic control. Weight loss/exercise. Update labs if AFP unremarkable, then ruq u/s.  ? ? ?PLAN  ? ?AFP, CMET, PT/INR.  If AFP normal, will schedule for right upper quadrant ultrasound. ?Recommend tight glycemic control, daily exercise as tolerated, 10 pound weight loss over the next 3 months. Recommend etoh cessation.  ?Given no concrete evidence of portal hypertension, will hold off on EGD for now.  Reassess at next ov. ?Complete hep b vaccination series. ?Return office visit in 6 months.  She will call sooner if she has any alarm symptoms, which have been provided to her today. ? ? ?Laureen Ochs. Lewis, MHS, PA-C ?[facility] ? ?

## 2021-10-07 ENCOUNTER — Other Ambulatory Visit: Payer: Medicare Other

## 2021-10-07 LAB — PROTIME-INR
INR: 1
Prothrombin Time: 10.4 s (ref 9.0–11.5)

## 2021-10-07 LAB — COMPREHENSIVE METABOLIC PANEL
AG Ratio: 1.5 (calc) (ref 1.0–2.5)
ALT: 35 U/L — ABNORMAL HIGH (ref 6–29)
AST: 38 U/L — ABNORMAL HIGH (ref 10–35)
Albumin: 4.1 g/dL (ref 3.6–5.1)
Alkaline phosphatase (APISO): 100 U/L (ref 37–153)
BUN: 10 mg/dL (ref 7–25)
CO2: 31 mmol/L (ref 20–32)
Calcium: 9.4 mg/dL (ref 8.6–10.4)
Chloride: 101 mmol/L (ref 98–110)
Creat: 0.57 mg/dL (ref 0.50–1.05)
Globulin: 2.8 g/dL (calc) (ref 1.9–3.7)
Glucose, Bld: 195 mg/dL — ABNORMAL HIGH (ref 65–99)
Potassium: 4.1 mmol/L (ref 3.5–5.3)
Sodium: 140 mmol/L (ref 135–146)
Total Bilirubin: 0.7 mg/dL (ref 0.2–1.2)
Total Protein: 6.9 g/dL (ref 6.1–8.1)

## 2021-10-07 LAB — AFP TUMOR MARKER: AFP-Tumor Marker: 3.7 ng/mL

## 2021-10-14 ENCOUNTER — Encounter: Payer: Self-pay | Admitting: Gastroenterology

## 2021-10-14 MED ORDER — ROSUVASTATIN CALCIUM 20 MG PO TABS: 20.0000 mg | ORAL_TABLET | Freq: Every day | ORAL | 3 refills | Status: DC

## 2021-10-15 ENCOUNTER — Encounter: Payer: Self-pay | Admitting: Internal Medicine

## 2021-10-15 ENCOUNTER — Other Ambulatory Visit: Payer: Self-pay | Admitting: *Deleted

## 2021-10-15 DIAGNOSIS — K760 Fatty (change of) liver, not elsewhere classified: Secondary | ICD-10-CM

## 2021-10-22 ENCOUNTER — Other Ambulatory Visit (HOSPITAL_COMMUNITY): Payer: Medicare Other

## 2021-10-28 ENCOUNTER — Ambulatory Visit (HOSPITAL_COMMUNITY)
Admission: RE | Admit: 2021-10-28 | Discharge: 2021-10-28 | Disposition: A | Discharge status: Home / Self Care | Payer: Medicare Other | Source: Ambulatory Visit | Attending: Gastroenterology | Admitting: Gastroenterology

## 2021-10-28 DIAGNOSIS — K760 Fatty (change of) liver, not elsewhere classified: Secondary | ICD-10-CM | POA: Diagnosis not present

## 2021-10-29 ENCOUNTER — Other Ambulatory Visit (HOSPITAL_COMMUNITY): Payer: Self-pay | Admitting: Oncology

## 2021-10-29 DIAGNOSIS — Z1231 Encounter for screening mammogram for malignant neoplasm of breast: Secondary | ICD-10-CM

## 2021-11-06 ENCOUNTER — Telehealth: Payer: Self-pay

## 2021-11-06 NOTE — Telephone Encounter (Signed)
Inbound fax from DME supplier requesting form be completed and faxed with clinical notes. DME supplies ordered via [employer] through online portal.  

## 2021-11-20 ENCOUNTER — Ambulatory Visit (INDEPENDENT_AMBULATORY_CARE_PROVIDER_SITE_OTHER): Payer: Medicare Other | Admitting: Internal Medicine

## 2021-11-20 ENCOUNTER — Encounter: Payer: Self-pay | Admitting: Internal Medicine

## 2021-11-20 VITALS — BP 130/74 | HR 108 | Ht 63.0 in | Wt 195.2 lb

## 2021-11-20 DIAGNOSIS — E1165 Type 2 diabetes mellitus with hyperglycemia: Secondary | ICD-10-CM

## 2021-11-20 DIAGNOSIS — E039 Hypothyroidism, unspecified: Secondary | ICD-10-CM

## 2021-11-20 LAB — POCT GLYCOSYLATED HEMOGLOBIN (HGB A1C): Hemoglobin A1C: 6.9 % — AB (ref 4.0–5.6)

## 2021-11-20 NOTE — Progress Notes (Signed)
Patient ID: Megan Church, female   DOB: [birth year], 66 y.o.   MRN: [id number]   HPI: Megan Church is a 66 y.o.-year-old female, initially referred by her PCP, Dr. Anastasio Champion, returning for follow-up for DM2, dx in 07/2008, insulin-dependent since 2012, then off, restarted 2020, uncontrolled, without long-term complications.  Last visit 3 months ago.  Interim history: No increased urination, blurry vision, nausea, chest pain.  She may be moving to [place] to be near her son and daughter-in-law, who just moved there for work.  She also has a daughter in [place], will get married soon.  DM2: Reviewed HbA1c levels: Lab Results  Component Value Date   HGBA1C 7.6 (A) 08/20/2021   HGBA1C 7.6 (A) 04/22/2021   HGBA1C 7.1 (A) 12/31/2020   HGBA1C 8.5 (H) 10/14/2020   HGBA1C 8.6 (A) 09/23/2020   HGBA1C 8.4 (A) 08/08/2020   HGBA1C 7.2 (A) 02/14/2020   HGBA1C 7.7 (A) 10/18/2019   HGBA1C 7.0 (A) 07/19/2019   HGBA1C 8.7 (H) 04/03/2019   Pt is on a regimen of: - Metformin 1000 mg 2x a day, with meals >> 2000 mg with dinner - Trulicity 1.5 mg weekly >> 3 mg weekly (increased 03/2019) >> Ozempic 1 mg weekly (changed 04/2021) >> 2 mg weekly - Levemir 40 >> 50 >> 55 >> 60-63 units daily - Lyumjev 8-12 >> 10-12 >> 12-14 units before each meal  We stopped glipizide in 07/2020 and switched to Lyumjev. She tried Invokana >> yeast infections. She was on Januvia-stopped 03/2019  Pt was checking her sugars 4x a day with her CGM:   Previously:   Previously:   Lowest sugar was 39 (CGM) >> 49 (but feels this was an error) Highest sugar was 381 >> .Marland Kitchen. 300s.  Glucometer: [place]  Pt's meals are: - Breakfast: coffee, occasionally eggs - Lunch: tuna salad - Dinner: chicken, rice/potato, salad - Snacks: 2 in the evening She is doing intermittent fasting -fasting window is in the morning.  She takes glipizide occasionally in the morning but mostly before lunch She is walking and gardening  several times a week.  -No CKD, last BUN/creatinine:  Lab Results  Component Value Date   BUN 10 10/06/2021   BUN 10 10/06/2021   CREATININE 0.52 10/06/2021   CREATININE 0.57 10/06/2021  Not on ACE inhibitor/ARB.  -+ HL. Lab Results  Component Value Date   CHOL 154 09/03/2021   HDL 49.60 09/03/2021   LDLCALC 84 09/03/2021   LDLDIRECT 96.0 02/14/2020   TRIG 101.0 09/03/2021   CHOLHDL 3 09/03/2021  On Crestor 20.  - last eye exam was in 08/2021: No DR reportedly. She has a history of cataract surgeries.  - no numbness and tingling in her feet. On Neurontin for L leg pain 2/2 back pain. She had two back surgeries in the past.  Foot exam 08/2021.  Pt has FH of DM in paternal uncle.  Hypothyroidism  -Diagnosed "years" ago -On LT4 prev.,  now on desiccated thyroid extract (NP thyroid) -She was previously on a very large dose of NP thyroid, 150 mg daily, with suppressed TSH (also with fatigue, subjective hyperthermia, insomnia).  In the past she was having hot flushes on 105 mg of NP thyroid daily so we decreased to 90 mg daily.  She takes this: - in am - fasting - at least 30 min from b'fast - + calcium later in the day - + iron at night qod - + multivitamins later in the day - + PPIs -  Omeprazole at night qod  - not on Biotin  TFTs were reviewed: Lab Results  Component Value Date   TSH 1.69 08/20/2021   TSH 1.18 09/23/2020   TSH 4.14 02/14/2020   TSH 3.08 11/27/2019   TSH 0.50 10/18/2019   TSH 1.52 07/19/2019   TSH 2.16 05/22/2019   TSH 0.08 (L) 04/17/2019   TSH 0.05 (L) 02/20/2019   She also has an isthmic thyroid nodule 1.8 cm >> this was biopsied in 2019 with benign results.  Pt denies: - feeling nodules in neck - hoarseness - dysphagia - choking  Parents have thyroid ds. No FH of thyroid cancer. No h/o radiation tx to head or neck. No herbal supplements. No Biotin use. No recent steroids use.   She had a T12 vertebral compression fracture in 03/2020  after a fall. She had L fusion Sx 10/15/2020. She had PNA after the intubation. She continues to have low back pain -previously was in physical therapy. She was on testosterone compounded cream >> now off.  ROS: + see HPI  Past Medical History:  Diagnosis Date   Anemia    Anxiety    Arthritis    Cancer ([place])    basal cell carcinoma right arm and nose spots removed   Depression    GERD (gastroesophageal reflux disease)    HLD (hyperlipidemia) 02/20/2019   Morbid obesity ([place]) 02/20/2019   Primary hypothyroidism 02/20/2019   RLS (restless legs syndrome)    Shingles    Sleep apnea    Type II diabetes mellitus, uncontrolled 02/20/2019   diagnosed 2010   Vitamin D deficiency disease 02/20/2019   Past Surgical History:  Procedure Laterality Date   ANTERIOR LAT LUMBAR FUSION N/A 10/15/2020   Procedure: ANTERIOR LATERAL LUMBAR FUSION 2 LEVELS (XLIF) L2-4;  Surgeon: Melina Schools, MD;  Location: [facility];  Service: Orthopedics;  Laterality: N/A;  4 hrs   BACK SURGERY  2012, 2016   lumbar fusion 2012, cleaning out of area 2016   BREAST BIOPSY Left 2010   CHOLECYSTECTOMY  2011   CLOSED REDUCTION ANKLE FRACTURE     CLOSED REDUCTION HUMERUS FRACTURE Right 2014   COLONOSCOPY WITH PROPOFOL N/A 02/21/2018   Procedure: COLONOSCOPY WITH PROPOFOL;  Surgeon: Danie Binder, MD;  [facility];  Service: Endoscopy;  Laterality: N/A;  1:15pm - pt knows to arrive at 10:30   EYE SURGERY  2016   eye lid droop    KNEE ARTHROSCOPY Right 2010   LUMBAR LAMINECTOMY/DECOMPRESSION MICRODISCECTOMY N/A 10/16/2020   Procedure: Revision decompression L2-3, L3-4;  Surgeon: Melina Schools, MD;  Location: [facility];  Service: Orthopedics;  Laterality: N/A;   POLYPECTOMY  02/21/2018   Procedure: POLYPECTOMY;  Surgeon: Danie Binder, MD;  [facility];  Service: Endoscopy;;  hepatic flexure polyp   SKIN CANCER EXCISION     basal cell cancer, arm/face   TOTAL KNEE ARTHROPLASTY Right  01/31/2017   Procedure: RIGHT TOTAL KNEE ARTHROPLASTY;  Surgeon: Paralee Cancel, MD;  Location: [facility];  Service: Orthopedics;  Laterality: Right;  90 mins   Social History   Socioeconomic History   Marital status: Married    Spouse name: Not on file   Number of children:  3   Years of education: Not on file   Highest education level: Not on file  Occupational History    + Homemaker, previously Licensed conveyancer  Social Needs   Financial resource strain: Not on file   Food insecurity    Worry:  Not on file    Inability: Not on file   Transportation needs    Medical: Not on file    Non-medical: Not on file  Tobacco Use   Smoking status: Former Smoker    Packs/day: 1.00    Years: 8.00    Pack years: 8.00    Types: Cigarettes    Start date: 06/14/1970    Quit date: 06/14/1978    Years since quitting: 40.8   Smokeless tobacco: Never Used   Tobacco comment: quit age 58  Substance and Sexual Activity   Alcohol use: Yes    Alcohol/week:  1-2 drinks daily    Varies        Drug use: No   Current Outpatient Medications on File Prior to Visit  Medication Sig Dispense Refill   calcium carbonate (OSCAL) 1500 (600 Ca) MG TABS tablet Take by mouth daily.     Cholecalciferol (VITAMIN D-3) 125 MCG (5000 UT) TABS Take 10,000 Units by mouth daily.     Continuous Blood Gluc Transmit (DEXCOM G6 TRANSMITTER) MISC 1 Device by Does not apply route every 3 (three) months. 1 each 3   insulin detemir (LEVEMIR FLEXTOUCH) 100 UNIT/ML FlexPen Inject 63 Units into the skin at bedtime. 45 mL 3   Insulin Lispro-aabc (LYUMJEV KWIKPEN) 100 UNIT/ML KwikPen Inject 12-14 units 3x a day before meals under skin 30 mL 3   Insulin Pen Needle (BD PEN NEEDLE NANO U/F) 32G X 4 MM MISC Inject 1 Units into the skin 3 (three) times daily. with insulin 300 each 3   Iron, Ferrous Sulfate, 325 (65 Fe) MG TABS Take 325 mg by mouth every other day. 30 tablet 0   Magnesium 500 MG CAPS Take 1 capsule by mouth daily.     melatonin 3 MG  TABS tablet Take 3 mg by mouth at bedtime.     metFORMIN (GLUCOPHAGE) 1000 MG tablet Take 1 tablet (1,000 mg total) by mouth 2 (two) times daily. (Patient taking differently: Take 1,000 mg by mouth 2 (two) times daily with a meal. Takes 2,000 mg at dinner) 180 tablet 0   NP THYROID 90 MG tablet Take 1 tablet (90 mg total) by mouth daily. 90 tablet 3   omeprazole (PRILOSEC) 20 MG capsule Take 1 capsule (20 mg total) by mouth every other day. 90 capsule 2   Polyethyl Glycol-Propyl Glycol (SYSTANE FREE OP) Place 1 drop into both eyes every 4 (four) hours as needed (Dry eyes).     rOPINIRole (REQUIP) 3 MG tablet Take 1 tablet (3 mg total) by mouth at bedtime. 90 tablet 2   rosuvastatin (CRESTOR) 20 MG tablet Take 1 tablet (20 mg total) by mouth daily. 90 tablet 3   Semaglutide, 2 MG/DOSE, (OZEMPIC, 2 MG/DOSE,) 8 MG/3ML SOPN Inject 2 mg into the skin once a week. 9 mL 1   vitamin B-12 (CYANOCOBALAMIN) 1000 MCG tablet Take 1,000 mcg by mouth daily.     No current facility-administered medications on file prior to visit.   Allergies  Allergen Reactions   Sulfa Antibiotics Hives and Other (See Comments)   Ace Inhibitors Cough and Other (See Comments)   Invokana [Canagliflozin] Other (See Comments)    Yeast    Vytorin [Ezetimibe-Simvastatin] Other (See Comments)    Leg cramps   Atorvastatin Hives and Other (See Comments)   Other Other (See Comments)   Family History  Problem Relation Age of Onset   Cancer Mother        breast  Heart disease Father    Stroke Father    Cancer Father        liver cancer had resection, had "hepatitis"   Heart disease Brother    Hyperlipidemia Brother    Hyperlipidemia Brother    Colon cancer Neg Hx     PE: BP 130/74 (BP Location: Right Arm, Patient Position: Sitting, Cuff Size: Normal)   Pulse (!) 108   Ht '5\' 3"'$  (1.6 m)   Wt 195 lb 3.2 oz (88.5 kg)   SpO2 97%   BMI 34.58 kg/m  Wt Readings from Last 3 Encounters:  11/20/21 195 lb 3.2 oz (88.5 kg)   10/06/21 202 lb 3.2 oz (91.7 kg)  09/03/21 199 lb 4 oz (90.4 kg)   Constitutional: overweight, in NAD Eyes: EOMI, no exophthalmos ENT: moist mucous membranes, no masses palpated in neck, no cervical lymphadenopathy Cardiovascular: Tachycardia, RR, No MRG Respiratory: CTA B Musculoskeletal: no deformities Skin: moist, warm, no rashes Neurological: no tremor with outstretched hands  ASSESSMENT: 1. DM2, insulin-dependent, uncontrolled, without long-term complications, but with hyperglycemia  2.  Hypothyroidism -Previously uncontrolled  PLAN:  1. Patient with longstanding, uncontrolled, type 2 diabetes, on oral antidiabetic regimen with metformin and also injectable regimen: Basal-bolus insulin and weekly GLP-1 receptor agonist.  At last visit, HbA1c was stable, at 7.6%.  Per review of her Dexcom CGM trends, it appeared that her sugars were fluctuating around the upper limit of the target range.  [place] was out of the country [place] 2 weeks prior to our last visit and she felt that sugars were higher at that time.  We discussed about increasing the Ozempic dose, but otherwise continue to the same regimen. CGM interpretation: -At today's visit, we reviewed her CGM downloads: It appears that 78% of values are in target range (goal >70%), while 22% are higher than 180 (goal <25%), and 0% are lower than 70 (goal <4%).  The calculated average blood sugar is 153.  The projected HbA1c for the next 3 months (GMI) is 7.0%. -Reviewing the CGM trends, it appears that her sugars are occasionally higher after her coffee with milk/creamer.  We discussed coffee raising the blood sugars even if black, but I recommended to stop milk/creamer and replace it with almond milk.  In the evening, her sugars are also occasionally higher after dinner.  She is taking Lyumjev at the start of the meal and we discussed that if sugars are slightly higher before dinner, she can take it 5 minutes before.  We also discussed about  possibly increasing the dose of Lyumjev before meals with more carbs.  I also advised her to try to start the meal with a salad or other veggies and ending with carbs.  Otherwise, we can continue the current regimen.  She did see an improvement in blood sugars after increasing the Ozempic dose.  She tolerates it well. - I suggested to:  Patient Instructions  Please continue: - Metformin 2000 mg at night - Levemir 60 units at bedtime - Lyumjev 12-14 units at the start of each meal - Ozempic 2 mg weekly  Stop the creamer in coffee >> replace this with almond milk (unsweet).  For meals with increased carbs may need a higher dose of Lyumjev (up to 16). Also, consider starting the meal with a salad.  Please continue NP thyroid 90 mg in a.m.  Take the thyroid hormone every day, with water, at least 30 minutes before breakfast, separated by at least 4 hours from: - acid  reflux medications - calcium - iron - multivitamins  Please return in 3-4 months.  - we checked her HbA1c: 6.9% (lower) - advised to check sugars at different times of the day - 4x a day, rotating check times - advised for yearly eye exams >> she is UTD - return to clinic in 3-4 months  2.  Hypothyroidism - latest thyroid labs reviewed with pt. >> normal: Lab Results  Component Value Date   TSH 1.69 08/20/2021  - she continues on NP thyroid 90 mg daily, but she is open to switch back to [place] if NP becomes unavailable - pt feels good on this dose. - we discussed about taking the thyroid hormone every day, with water, >30 minutes before breakfast, separated by >4 hours from acid reflux medications, calcium, iron, multivitamins. Pt. is taking it correctly.  Philemon Kingdom, MD PhD [facility] Endocrinology

## 2021-11-20 NOTE — Patient Instructions (Addendum)
Please continue: - Metformin 2000 mg at night - Levemir 60 units at bedtime - Lyumjev 12-14 units at the start of each meal - Ozempic 2 mg weekly  Stop the creamer in coffee >> replace this with almond milk (unsweet).  For meals with increased carbs may need a higher dose of Lyumjev (up to 16). Also, consider starting the meal with a salad.  Please continue NP thyroid 90 mg in a.m.  Take the thyroid hormone every day, with water, at least 30 minutes before breakfast, separated by at least 4 hours from: - acid reflux medications - calcium - iron - multivitamins  Please return in 3-4 months.

## 2021-11-26 ENCOUNTER — Encounter: Payer: Self-pay | Admitting: Internal Medicine

## 2021-11-26 DIAGNOSIS — E039 Hypothyroidism, unspecified: Secondary | ICD-10-CM

## 2021-11-26 DIAGNOSIS — E1165 Type 2 diabetes mellitus with hyperglycemia: Secondary | ICD-10-CM

## 2021-12-09 ENCOUNTER — Other Ambulatory Visit: Payer: Self-pay | Admitting: Internal Medicine

## 2021-12-09 DIAGNOSIS — E1165 Type 2 diabetes mellitus with hyperglycemia: Secondary | ICD-10-CM

## 2021-12-09 MED ORDER — METFORMIN HCL 1000 MG PO TABS: 1000.0000 mg | ORAL_TABLET | Freq: Two times a day (BID) | ORAL | 1 refills | Status: AC

## 2021-12-20 ENCOUNTER — Other Ambulatory Visit (INDEPENDENT_AMBULATORY_CARE_PROVIDER_SITE_OTHER): Payer: Self-pay | Admitting: Nurse Practitioner

## 2021-12-20 DIAGNOSIS — M545 Low back pain, unspecified: Secondary | ICD-10-CM

## 2021-12-23 ENCOUNTER — Ambulatory Visit: Payer: Medicare Other | Admitting: Gastroenterology

## 2021-12-23 MED ORDER — WP THYROID 97.5 MG PO TABS: 97.5000 mg | ORAL_TABLET | Freq: Every day | ORAL | 1 refills | Status: DC

## 2021-12-23 NOTE — Addendum Note (Signed)
Addended by: Lauralyn Primes on: 12/23/2021 02:15 PM   Modules accepted: Orders

## 2021-12-24 MED ORDER — THYROID 90 MG PO TABS: 90.0000 mg | ORAL_TABLET | Freq: Every day | ORAL | 3 refills | Status: DC

## 2021-12-24 NOTE — Addendum Note (Signed)
Addended by: Lauralyn Primes on: 12/24/2021 12:36 PM   Modules accepted: Orders

## 2021-12-25 ENCOUNTER — Ambulatory Visit (INDEPENDENT_AMBULATORY_CARE_PROVIDER_SITE_OTHER): Payer: Medicare Other | Admitting: Gastroenterology

## 2021-12-25 ENCOUNTER — Encounter: Payer: Self-pay | Admitting: Gastroenterology

## 2021-12-25 VITALS — BP 127/79 | HR 96 | Temp 96.9°F | Ht 63.0 in | Wt 196.8 lb

## 2021-12-25 DIAGNOSIS — R7989 Other specified abnormal findings of blood chemistry: Secondary | ICD-10-CM

## 2021-12-25 DIAGNOSIS — K7402 Hepatic fibrosis, advanced fibrosis: Secondary | ICD-10-CM | POA: Diagnosis not present

## 2021-12-25 NOTE — Patient Instructions (Signed)
You are due labs in 03/2022. You can take orders for labs at your convenience ([facility]). Your ultrasound will be due in 04/2022. If you are still in the area and need [place] to make arrangements for this please call and let us know.  Continue to try and avoid alcohol, strive for weight loss and good control of your diabetes. Stay physically active! Good luck on your move!

## 2021-12-25 NOTE — Progress Notes (Signed)
GI Office Note    Referring Provider: Ailene Ards, NP Primary Care Physician:  Ailene Ards, NP  Primary Gastroenterologist: Elon Alas. Abbey Chatters, DO   Chief Complaint   Chief Complaint  Patient presents with   Follow-up    Patient here today for a follow up on her abnormal LFT's.  Patient says she has had some issues with loose stools at times. She is not taking anything for this currently. She is still taking omeprazole 20 mg every other day which do help her reflux symptoms. Denies any other current gi issue.    History of Present Illness   Megan Church is a 66 y.o. female presenting today for follow-up.  Last seen in April 2023.  She has a history of prominent caudate lobe with subtle irregular hepatic capsule findings concerning for cirrhosis/hepatic steatosis noted on ultrasound 11/2020.  Also noted to have elevated LFTs dating back to September 2020.   FibroSure test showed F1-F2 suggesting mild to moderate fibrosis.  Her LFTs at that time were mildly elevated as outlined below.  No evidence of thrombocytopenia.  ANA negative, hepatitis A antibody positive, hepatitis B surface antibody nonreactive, hepatitis B surface antigen negative, hepatitis C antibody negative, immunoglobulins unremarkable with only minimally elevated IgA, smooth muscle antibody negative, AMA negative, iron/ferritin normal, celiac serologies negative (TTG IgA, serum IgA).  She has been vaccinated for hepatitis B.  May 2023 right upper quadrant ultrasound showed probable hepatic steatosis.  Labs in April with AST of 3.7, AST 38, ALT 35.  MELD sodium of 6.  Recently sold her house, plans to move to [place].  She is not sure how long she will still be in the area but could be several months.  Recently got back from vacation.  She admits that she did consume some alcohol but no more than 2 drinks in a 24-hour period of time.  Typically does not drink on a regular basis anymore.  She has been walking a lot  more.  She lost down to 191 pounds but gained a few pounds back while on her trip.  She states her Ozempic was increased, her A1c was down to 6.9 from 7.6.  Overall feeling well.  Denies any abdominal pain.  Appetite is good.  Bowel movements are regular.  No upper GI symptoms.   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 Resected and retrieved.  Sessile serrated polyp - MODERATE Diverticulosis in the entire examined colon. - Redundant LEFT colon. - External and internal hemorrhoids. -Colonoscopy in 5 years    Medications   Current Outpatient Medications  Medication Sig Dispense Refill   calcium carbonate (OSCAL) 1500 (600 Ca) MG TABS tablet Take by mouth daily.     Cholecalciferol (VITAMIN D-3) 125 MCG (5000 UT) TABS Take 10,000 Units by mouth daily.     Continuous Blood Gluc Transmit (DEXCOM G6 TRANSMITTER) MISC 1 Device by Does not apply route every 3 (three) months. 1 each 3   insulin detemir (LEVEMIR FLEXTOUCH) 100 UNIT/ML FlexPen Inject 63 Units into the skin at bedtime. (Patient taking differently: Inject 60 Units into the skin at bedtime.) 45 mL 3   Insulin Lispro-aabc (LYUMJEV KWIKPEN) 100 UNIT/ML KwikPen Inject 12-14  units 3x a day before meals under skin (Patient taking differently: Inject 12-14 units 2- 3x a day before meals under skin) 30 mL 3   Insulin Pen Needle (BD PEN NEEDLE NANO U/F) 32G X 4 MM MISC Inject 1 Units into the skin 3 (three) times daily. with insulin 300 each 3   Iron, Ferrous Sulfate, 325 (65 Fe) MG TABS Take 325 mg by mouth every other day. 30 tablet 0   Magnesium 500 MG CAPS Take 1 capsule by mouth daily.     melatonin 3 MG TABS tablet  Take 3 mg by mouth at bedtime.     metFORMIN (GLUCOPHAGE) 1000 MG tablet Take 1 tablet (1,000 mg total) by mouth 2 (two) times daily. (Patient taking differently: Take 2,000 mg by mouth daily.) 180 tablet 1   omeprazole (PRILOSEC) 20 MG capsule Take 1 capsule (20 mg total) by mouth every other day. 90 capsule 2   Polyethyl Glycol-Propyl Glycol (SYSTANE FREE OP) Place 1 drop into both eyes every 4 (four) hours as needed (Dry eyes).     rOPINIRole (REQUIP) 3 MG tablet Take 1 tablet (3 mg total) by mouth at bedtime. 90 tablet 2   rosuvastatin (CRESTOR) 20 MG tablet Take 1 tablet (20 mg total) by mouth daily. 90 tablet 3   Semaglutide, 2 MG/DOSE, (OZEMPIC, 2 MG/DOSE,) 8 MG/3ML SOPN Inject 2 mg into the skin once a week. 9 mL 1   thyroid (ARMOUR THYROID) 90 MG tablet Take 1 tablet (90 mg total) by mouth daily. 90 tablet 3   vitamin B-12 (CYANOCOBALAMIN) 1000 MCG tablet Take 1,000 mcg by mouth daily.     No current facility-administered medications for this visit.    Allergies   Allergies as of 12/25/2021 - Review Complete 12/25/2021  Allergen Reaction Noted   Sulfa antibiotics Hives and Other (See Comments) 07/24/2016   Ace inhibitors Cough and Other (See Comments) 01/20/2017   Invokana [canagliflozin] Other (See Comments) 01/20/2017   Vytorin [ezetimibe-simvastatin] Other (See Comments) 01/20/2017   Atorvastatin Hives and Other (See Comments) 07/24/2016   Other Other (See Comments) 11/26/2020      Review of Systems   General: Negative for anorexia, unintentional weight loss, fever, chills, fatigue, weakness. ENT: Negative for hoarseness, difficulty swallowing , nasal congestion. CV: Negative for chest pain, angina, palpitations, dyspnea on exertion, peripheral edema.  Respiratory: Negative for dyspnea at rest, dyspnea on exertion, cough, sputum, wheezing.  GI: See history of present illness. GU:  Negative for dysuria, hematuria, urinary incontinence, urinary frequency, nocturnal  urination.  Endo: Negative for unusual weight change.     Physical Exam   BP 127/79 (BP Location: Left Arm, Patient Position: Sitting, Cuff Size: Large)   Pulse 96   Temp (!) 96.9 F (36.1 C) (Oral)   Ht '5\' 3"'$  (1.6 m)   Wt 196 lb 12.8 oz (89.3 kg)   BMI 34.86 kg/m    General: Well-nourished, well-developed in no acute distress.  Eyes: No icterus. Mouth: Oropharyngeal mucosa moist and pink , no lesions erythema or exudate. Lungs: Clear to auscultation bilaterally.  Heart: Regular rate and rhythm, no murmurs rubs or gallops.  Abdomen: Bowel sounds are normal, nontender, nondistended, liver edge easily palpated in the epigastric region.    Rectal: Performed Extremities: No lower extremity edema. No clubbing or deformities. Neuro: Alert and oriented x 4   Skin: Warm and dry, no jaundice.   Psych: Alert and cooperative, normal mood and affect.  Labs   Lab Results  Component  Value Date   CREATININE 0.52 10/06/2021   BUN 10 10/06/2021   NA 139 10/06/2021   K 4.2 10/06/2021   CL 100 10/06/2021   CO2 31 10/06/2021   Lab Results  Component Value Date   ALT 35 (H) 10/06/2021   AST 38 (H) 10/06/2021   ALKPHOS 103 09/03/2021   BILITOT 0.7 10/06/2021   Lab Results  Component Value Date   WBC 6.0 09/03/2021   HGB 12.7 09/03/2021   HCT 37.1 09/03/2021   MCV 93.0 09/03/2021   PLT 167.0 09/03/2021   Lab Results  Component Value Date   INR 1.0 10/06/2021   INR 1.0 03/24/2021   INR 1.3 (H) 10/17/2020    Imaging Studies   No results found.  Assessment   Abnormal liver on ultrasound/concern for cirrhosis: Caudate lobe prominence with subtle irregular hepatic capsule with hepatic steatosis, concerning for cirrhosis, noted on ultrasound June 2022.  Spleen normal at that time.  Recent right upper quadrant ultrasound with probable hepatic steatosis, no mention of nodular borders.  FibroSure in 2022 showed F1-F2 fibrosis with marked or severe steatosis.  Transaminases remain  mildly elevated.  Extensive serologies as outlined above.  Suspect at least significant fibrosis, err on the side of caution with management as cirrhosis given nodular borders.   PLAN   Continue MELD score, AFP every 6 months along with liver ultrasound for hepatoma screening.  She is due for labs in October, ultrasound in November.  If she is still in the area, we will be happy to assist her with this.  She was given lab orders today.   Encouraged alcohol cessation.  Reiterated other fatty liver recommendations including tight glycemic control, exercise and strive for healthier weight. Next colonoscopy due in September 2024 for history of adenomatous colon polyps. We will hold off on upper endoscopy at this time, will need at some point to screen for esophageal varices however given no evidence of portal hypertension/thrombocytopenia she remains low risk for esophageal varices.   Laureen Ochs. Bobby Rumpf, [place], [facility]

## 2021-12-29 MED ORDER — THYROID 90 MG PO TABS: 90.0000 mg | ORAL_TABLET | Freq: Every day | ORAL | 1 refills | Status: DC

## 2021-12-29 NOTE — Addendum Note (Signed)
Addended by: Lauralyn Primes on: 12/29/2021 04:50 PM   Modules accepted: Orders

## 2022-01-04 ENCOUNTER — Ambulatory Visit (HOSPITAL_COMMUNITY)
Admission: RE | Admit: 2022-01-04 | Discharge: 2022-01-04 | Disposition: A | Discharge status: Home / Self Care | Payer: Medicare Other | Source: Ambulatory Visit | Attending: Nurse Practitioner | Admitting: Nurse Practitioner

## 2022-01-04 DIAGNOSIS — Z1231 Encounter for screening mammogram for malignant neoplasm of breast: Secondary | ICD-10-CM | POA: Insufficient documentation

## 2022-01-07 ENCOUNTER — Ambulatory Visit: Payer: Medicare Other | Admitting: Nurse Practitioner

## 2022-01-15 ENCOUNTER — Ambulatory Visit: Payer: Medicare Other | Admitting: Nurse Practitioner

## 2022-01-21 ENCOUNTER — Ambulatory Visit (INDEPENDENT_AMBULATORY_CARE_PROVIDER_SITE_OTHER): Payer: Medicare Other | Admitting: Nurse Practitioner

## 2022-01-21 ENCOUNTER — Encounter: Payer: Self-pay | Admitting: Nurse Practitioner

## 2022-01-21 VITALS — BP 130/74 | HR 85 | Temp 97.7°F | Ht 63.0 in | Wt 192.0 lb

## 2022-01-21 DIAGNOSIS — E782 Mixed hyperlipidemia: Secondary | ICD-10-CM

## 2022-01-21 DIAGNOSIS — E559 Vitamin D deficiency, unspecified: Secondary | ICD-10-CM

## 2022-01-21 DIAGNOSIS — K219 Gastro-esophageal reflux disease without esophagitis: Secondary | ICD-10-CM

## 2022-01-21 DIAGNOSIS — Z780 Asymptomatic menopausal state: Secondary | ICD-10-CM

## 2022-01-21 DIAGNOSIS — Z13820 Encounter for screening for osteoporosis: Secondary | ICD-10-CM

## 2022-01-21 DIAGNOSIS — M545 Low back pain, unspecified: Secondary | ICD-10-CM

## 2022-01-21 LAB — VITAMIN D 25 HYDROXY (VIT D DEFICIENCY, FRACTURES): VITD: 89.54 ng/mL (ref 30.00–100.00)

## 2022-01-21 LAB — BASIC METABOLIC PANEL
BUN: 10 mg/dL (ref 6–23)
CO2: 31 mEq/L (ref 19–32)
Calcium: 9.5 mg/dL (ref 8.4–10.5)
Chloride: 104 mEq/L (ref 96–112)
Creatinine, Ser: 0.53 mg/dL (ref 0.40–1.20)
GFR: 96.23 mL/min (ref >60.00)
Glucose, Bld: 104 mg/dL — ABNORMAL HIGH (ref 70–99)
Potassium: 3.8 mEq/L (ref 3.5–5.1)
Sodium: 142 mEq/L (ref 135–145)

## 2022-01-21 MED ORDER — OMEPRAZOLE 20 MG PO CPDR: 20.0000 mg | DELAYED_RELEASE_CAPSULE | Freq: Every day | ORAL | 3 refills | Status: AC

## 2022-01-21 MED ORDER — ROPINIROLE HCL 3 MG PO TABS: 3.0000 mg | ORAL_TABLET | Freq: Every day | ORAL | 3 refills | Status: AC

## 2022-01-21 MED ORDER — ROSUVASTATIN CALCIUM 20 MG PO TABS: 20.0000 mg | ORAL_TABLET | Freq: Every day | ORAL | 3 refills | Status: AC

## 2022-01-21 NOTE — Assessment & Plan Note (Signed)
DEXA scan ordered today, will help to get this scheduled prior to patient today.  If unable to do so patient encouraged to follow-up with new PCP and discuss scheduling DEXA scan for osteoporosis screening at that point.  She reports understanding.

## 2022-01-21 NOTE — Assessment & Plan Note (Signed)
Patient continues on 5000 IUs of vitamin D3 daily.  Will check serum level today, further recommendations may be made based upon these results.

## 2022-01-21 NOTE — Assessment & Plan Note (Signed)
Patient continues on Requip chronically, symptoms well-controlled.  Refill sent to pharmacy today.

## 2022-01-21 NOTE — Assessment & Plan Note (Signed)
Patient on Prilosec chronically for GERD, symptoms well-controlled.  Refill sent to patient's pharmacy today.

## 2022-01-21 NOTE — Progress Notes (Signed)
Established Patient Office Visit  Subjective   Patient ID: Megan Church, female    DOB: [birth year]  Age: 66 y.o. MRN: [id number]  Chief Complaint  Patient presents with   83mofollow up    Patient arrives today for the above.  She reports that she is getting ready to move out of [place] and needs refill on her chronic medications prior to move.  Last office visit serum vitamin D was checked and it was supratherapeutic she was told to reduce dose down to 5000 IUs of vitamin D3 daily.  She reports that she has done so.  She is due to have serum checked again today.  She also brings previous DEXA scan which was completed 19 years ago and showed no osteoporosis or osteopenia.  She is due for repeat screening.  She has no acute concerns.    Review of Systems  Respiratory:  Negative for shortness of breath.   Cardiovascular:  Negative for chest pain.  Neurological:  Negative for loss of consciousness.  Psychiatric/Behavioral:  Negative for suicidal ideas.       Objective:     BP 130/74 (BP Location: Right Arm, Patient Position: Sitting, Cuff Size: Large)   Pulse 85   Temp 97.7 F (36.5 C) (Oral)   Ht '5\' 3"'$  (1.6 m)   Wt 192 lb (87.1 kg)   SpO2 96%   BMI 34.01 kg/m    Physical Exam Vitals reviewed.  Constitutional:      General: She is not in acute distress.    Appearance: Normal appearance.  HENT:     Head: Normocephalic and atraumatic.  Neck:     Vascular: No carotid bruit.  Cardiovascular:     Rate and Rhythm: Normal rate and regular rhythm.     Pulses: Normal pulses.     Heart sounds: Normal heart sounds.  Pulmonary:     Effort: Pulmonary effort is normal.     Breath sounds: Normal breath sounds.  Skin:    General: Skin is warm and dry.  Neurological:     General: No focal deficit present.     Mental Status: She is alert and oriented to person, place, and time.  Psychiatric:        Mood and Affect: Mood normal.        Behavior: Behavior normal.         Judgment: Judgment normal.      No results found for any visits on 01/21/22.    The 10-year ASCVD risk score (Arnett DK, et al., 2019) is: 11%    Assessment & Plan:   Problem List Items Addressed This Visit       Digestive   GERD (gastroesophageal reflux disease)    Patient on Prilosec chronically for GERD, symptoms well-controlled.  Refill sent to patient's pharmacy today.      Relevant Medications   omeprazole (PRILOSEC) 20 MG capsule     Other   Vitamin D deficiency disease - Primary    Patient continues on 5000 IUs of vitamin D3 daily.  Will check serum level today, further recommendations may be made based upon these results.      Relevant Orders   VITAMIN D 25 Hydroxy (Vit-D Deficiency, Fractures)   Basic metabolic panel   HLD (hyperlipidemia)    Chronic, patient continues on rosuvastatin.  Refill sent to pharmacy today.      Relevant Medications   rosuvastatin (CRESTOR) 20 MG tablet   Acute midline low back pain without  sciatica    Patient continues on Requip chronically, symptoms well-controlled.  Refill sent to pharmacy today.      Relevant Medications   rOPINIRole (REQUIP) 3 MG tablet   Encounter for osteoporosis screening in asymptomatic postmenopausal patient    DEXA scan ordered today, will help to get this scheduled prior to patient today.  If unable to do so patient encouraged to follow-up with new PCP and discuss scheduling DEXA scan for osteoporosis screening at that point.  She reports understanding.      Relevant Orders   DG Bone Density    Return if symptoms worsen or fail to improve.    Ailene Ards, NP

## 2022-01-21 NOTE — Assessment & Plan Note (Signed)
Chronic, patient continues on rosuvastatin.  Refill sent to pharmacy today.

## 2022-01-25 ENCOUNTER — Other Ambulatory Visit (HOSPITAL_BASED_OUTPATIENT_CLINIC_OR_DEPARTMENT_OTHER): Payer: Medicare Other

## 2022-01-26 ENCOUNTER — Ambulatory Visit (HOSPITAL_BASED_OUTPATIENT_CLINIC_OR_DEPARTMENT_OTHER)
Admission: RE | Admit: 2022-01-26 | Discharge: 2022-01-26 | Disposition: A | Discharge status: Home / Self Care | Payer: Medicare Other | Source: Ambulatory Visit | Attending: Nurse Practitioner | Admitting: Nurse Practitioner

## 2022-01-26 DIAGNOSIS — Z780 Asymptomatic menopausal state: Secondary | ICD-10-CM | POA: Diagnosis present

## 2022-01-26 DIAGNOSIS — Z13820 Encounter for screening for osteoporosis: Secondary | ICD-10-CM | POA: Insufficient documentation

## 2022-02-23 ENCOUNTER — Other Ambulatory Visit: Payer: Self-pay | Admitting: Internal Medicine

## 2022-03-03 ENCOUNTER — Encounter: Payer: Self-pay | Admitting: *Deleted

## 2022-03-15 ENCOUNTER — Encounter: Payer: Self-pay | Admitting: Internal Medicine

## 2022-03-15 ENCOUNTER — Encounter: Payer: Self-pay | Admitting: Nurse Practitioner

## 2022-03-15 DIAGNOSIS — E039 Hypothyroidism, unspecified: Secondary | ICD-10-CM

## 2022-03-15 MED ORDER — THYROID 90 MG PO TABS: 90.0000 mg | ORAL_TABLET | Freq: Every day | ORAL | 1 refills | Status: AC

## 2022-03-16 ENCOUNTER — Other Ambulatory Visit: Payer: Self-pay | Admitting: Internal Medicine

## 2022-03-16 DIAGNOSIS — E119 Type 2 diabetes mellitus without complications: Secondary | ICD-10-CM

## 2022-03-24 ENCOUNTER — Ambulatory Visit: Payer: Medicare Other | Admitting: Internal Medicine

## 2022-03-31 ENCOUNTER — Ambulatory Visit (INDEPENDENT_AMBULATORY_CARE_PROVIDER_SITE_OTHER): Payer: Medicare Other

## 2022-03-31 DIAGNOSIS — Z0000 Encounter for general adult medical examination without abnormal findings: Secondary | ICD-10-CM | POA: Diagnosis not present

## 2022-03-31 NOTE — Patient Instructions (Signed)
Megan Church , Thank you for taking time to come for your Medicare Wellness Visit. I appreciate your ongoing commitment to your health goals. Please review the following plan we discussed and let me know if I can assist you in the future.   These are the goals we discussed:  Goals      Maintain my health.        This is a list of the screening recommended for you and due dates:  Health Maintenance  Topic Date Due   Yearly kidney health urinalysis for diabetes  11/26/2021   COVID-19 Vaccine (5 - Pfizer risk series) 05/03/2022   Hemoglobin A1C  05/22/2022   Complete foot exam   08/21/2022   Eye exam for diabetics  08/25/2022   Yearly kidney function blood test for diabetes  01/22/2023   Mammogram  01/05/2024   Colon Cancer Screening  02/22/2028   Tetanus Vaccine  09/04/2031   Pneumonia Vaccine  Completed   Flu Shot  Completed   DEXA scan (bone density measurement)  Completed   Hepatitis C Screening: USPSTF Recommendation to screen - Ages 52-79 yo.  Completed   Zoster (Shingles) Vaccine  Completed   HPV Vaccine  Aged Out    Advanced directives: Yes; Please bring a copy of your health care power of attorney and living will to the office at your convenience.  Conditions/risks identified: Yes; Type II Diabetes Mellitus   Next appointment: Follow up in one year for your annual wellness visit.   Preventive Care 25 Years and Older, Female Preventive care refers to lifestyle choices and visits with your health care provider that can promote health and wellness. What does preventive care include? A yearly physical exam. This is also called an annual well check. Dental exams once or twice a year. Routine eye exams. Ask your health care provider how often you should have your eyes checked. Personal lifestyle choices, including: Daily care of your teeth and gums. Regular physical activity. Eating a healthy diet. Avoiding tobacco and drug use. Limiting alcohol use. Practicing safe  sex. Taking low-dose aspirin every day. Taking vitamin and mineral supplements as recommended by your health care provider. What happens during an annual well check? The services and screenings done by your health care provider during your annual well check will depend on your age, overall health, lifestyle risk factors, and family history of disease. Counseling  Your health care provider may ask you questions about your: Alcohol use. Tobacco use. Drug use. Emotional well-being. Home and relationship well-being. Sexual activity. Eating habits. History of falls. Memory and ability to understand (cognition). Work and work Statistician. Reproductive health. Screening  You may have the following tests or measurements: Height, weight, and BMI. Blood pressure. Lipid and cholesterol levels. These may be checked every 5 years, or more frequently if you are over 62 years old. Skin check. Lung cancer screening. You may have this screening every year starting at age 7 if you have a 30-pack-year history of smoking and currently smoke or have quit within the past 15 years. Fecal occult blood test (FOBT) of the stool. You may have this test every year starting at age 41. Flexible sigmoidoscopy or colonoscopy. You may have a sigmoidoscopy every 5 years or a colonoscopy every 10 years starting at age 65. Hepatitis C blood test. Hepatitis B blood test. Sexually transmitted disease (STD) testing. Diabetes screening. This is done by checking your blood sugar (glucose) after you have not eaten for a while (fasting). You may  have this done every 1-3 years. Bone density scan. This is done to screen for osteoporosis. You may have this done starting at age 69. Mammogram. This may be done every 1-2 years. Talk to your health care provider about how often you should have regular mammograms. Talk with your health care provider about your test results, treatment options, and if necessary, the need for more  tests. Vaccines  Your health care provider may recommend certain vaccines, such as: Influenza vaccine. This is recommended every year. Tetanus, diphtheria, and acellular pertussis (Tdap, Td) vaccine. You may need a Td booster every 10 years. Zoster vaccine. You may need this after age 63. Pneumococcal 13-valent conjugate (PCV13) vaccine. One dose is recommended after age 12. Pneumococcal polysaccharide (PPSV23) vaccine. One dose is recommended after age 50. Talk to your health care provider about which screenings and vaccines you need and how often you need them. This information is not intended to replace advice given to you by your health care provider. Make sure you discuss any questions you have with your health care provider. Document Released: 06/27/2015 Document Revised: 02/18/2016 Document Reviewed: 04/01/2015 Elsevier Interactive Patient Education  2017 [employer] Prevention in the Home Falls can cause injuries. They can happen to people of all ages. There are many things you can do to make your home safe and to help prevent falls. What can I do on the outside of my home? Regularly fix the edges of walkways and driveways and fix any cracks. Remove anything that might make you trip as you walk through a door, such as a raised step or threshold. Trim any bushes or trees on the path to your home. Use bright outdoor lighting. Clear any walking paths of anything that might make someone trip, such as rocks or tools. Regularly check to see if handrails are loose or broken. Make sure that both sides of any steps have handrails. Any raised decks and porches should have guardrails on the edges. Have any leaves, snow, or ice cleared regularly. Use sand or salt on walking paths during winter. Clean up any spills in your garage right away. This includes oil or grease spills. What can I do in the bathroom? Use night lights. Install grab bars by the toilet and in the tub and shower.  Do not use towel bars as grab bars. Use non-skid mats or decals in the tub or shower. If you need to sit down in the shower, use a plastic, non-slip stool. Keep the floor dry. Clean up any water that spills on the floor as soon as it happens. Remove soap buildup in the tub or shower regularly. Attach bath mats securely with double-sided non-slip rug tape. Do not have throw rugs and other things on the floor that can make you trip. What can I do in the bedroom? Use night lights. Make sure that you have a light by your bed that is easy to reach. Do not use any sheets or blankets that are too big for your bed. They should not hang down onto the floor. Have a firm chair that has side arms. You can use this for support while you get dressed. Do not have throw rugs and other things on the floor that can make you trip. What can I do in the kitchen? Clean up any spills right away. Avoid walking on wet floors. Keep items that you use a lot in easy-to-reach places. If you need to reach something above you, use a strong step  stool that has a grab bar. Keep electrical cords out of the way. Do not use floor polish or wax that makes floors slippery. If you must use wax, use non-skid floor wax. Do not have throw rugs and other things on the floor that can make you trip. What can I do with my stairs? Do not leave any items on the stairs. Make sure that there are handrails on both sides of the stairs and use them. Fix handrails that are broken or loose. Make sure that handrails are as long as the stairways. Check any carpeting to make sure that it is firmly attached to the stairs. Fix any carpet that is loose or worn. Avoid having throw rugs at the top or bottom of the stairs. If you do have throw rugs, attach them to the floor with carpet tape. Make sure that you have a light switch at the top of the stairs and the bottom of the stairs. If you do not have them, ask someone to add them for you. What else  can I do to help prevent falls? Wear shoes that: Do not have high heels. Have rubber bottoms. Are comfortable and fit you well. Are closed at the toe. Do not wear sandals. If you use a stepladder: Make sure that it is fully opened. Do not climb a closed stepladder. Make sure that both sides of the stepladder are locked into place. Ask someone to hold it for you, if possible. Clearly mark and make sure that you can see: Any grab bars or handrails. First and last steps. Where the edge of each step is. Use tools that help you move around (mobility aids) if they are needed. These include: Canes. Walkers. Scooters. Crutches. Turn on the lights when you go into a dark area. Replace any light bulbs as soon as they burn out. Set up your furniture so you have a clear path. Avoid moving your furniture around. If any of your floors are uneven, fix them. If there are any pets around you, be aware of where they are. Review your medicines with your doctor. Some medicines can make you feel dizzy. This can increase your chance of falling. Ask your doctor what other things that you can do to help prevent falls. This information is not intended to replace advice given to you by your health care provider. Make sure you discuss any questions you have with your health care provider. Document Released: 03/27/2009 Document Revised: 11/06/2015 Document Reviewed: 07/05/2014 Elsevier Interactive Patient Education  2017 [employer].

## 2022-03-31 NOTE — Progress Notes (Addendum)
Virtual Visit via Telephone Note  I connected with  Megan Church on 03/31/22 at 11:00 AM EDT by telephone and verified that I am speaking with the correct person using two identifiers.  Location: Patient: Home Provider: [place] Persons participating in the virtual visit: [place]   I discussed the limitations, risks, security and privacy concerns of performing an evaluation and management service by telephone and the availability of in person appointments. The patient expressed understanding and agreed to proceed.  Interactive audio and video telecommunications were attempted between this nurse and patient, however failed, due to patient having technical difficulties OR patient did not have access to video capability.  We continued and completed visit with audio only.  Some vital signs may be absent or patient reported.   Sheral Flow, LPN  Subjective:   Megan Church is a 66 y.o. female who presents for an Initial Medicare Annual Wellness Visit.  Review of Systems     Cardiac Risk Factors include: advanced age (>82mn, >>50women);diabetes mellitus;dyslipidemia;family history of premature cardiovascular disease;obesity (BMI >30kg/m2)     Objective:    There were no vitals filed for this visit. There is no height or weight on file to calculate BMI.     03/31/2022   11:19 AM 10/16/2020    8:00 AM 10/14/2020   10:17 AM 09/05/2019   11:10 AM 02/21/2018   10:51 AM 02/14/2018   12:52 PM 01/31/2017    3:31 PM  Advanced Directives  Does Patient Have a Medical Advance Directive? Yes No No No Yes No Yes  Type of AParamedicof APrivateerLiving will    HDaviswill  Does patient want to make changes to medical advance directive?       No - Patient declined  Copy of [place]in Chart? No - copy requested    No - copy requested    Would patient like information on creating a  medical advance directive?  No - Patient declined No - Patient declined No - Patient declined  No - Patient declined     Current Medications (verified) Outpatient Encounter Medications as of 03/31/2022  Medication Sig   Melatonin 3 MG CAPS Take by mouth.   BD PEN NEEDLE NANO U/F 32G X 4 MM MISC INJECT 1 PEN NEEDLE UNDER THE SKIN THREE TIMES A DAY WITH INSULIN   calcium carbonate (OSCAL) 1500 (600 Ca) MG TABS tablet Take by mouth daily.   Cholecalciferol (VITAMIN D-3) 125 MCG (5000 UT) TABS Take 10,000 Units by mouth daily.   Continuous Blood Gluc Transmit (DEXCOM G6 TRANSMITTER) MISC 1 Device by Does not apply route every 3 (three) months.   insulin detemir (LEVEMIR FLEXTOUCH) 100 UNIT/ML FlexPen Inject 63 Units into the skin at bedtime. (Patient taking differently: Inject 60 Units into the skin at bedtime.)   Insulin Lispro-aabc (LYUMJEV KWIKPEN) 100 UNIT/ML KwikPen INJECT 8 TO 12 UNITS BEFORE MEALS UNDER THE SKIN   Iron, Ferrous Sulfate, 325 (65 Fe) MG TABS Take 325 mg by mouth every other day.   Magnesium 500 MG CAPS Take 1 capsule by mouth daily.   metFORMIN (GLUCOPHAGE) 1000 MG tablet Take 1 tablet (1,000 mg total) by mouth 2 (two) times daily. (Patient taking differently: Take 2,000 mg by mouth daily.)   omeprazole (PRILOSEC) 20 MG capsule Take 1 capsule (20 mg total) by mouth daily.   Polyethyl Glycol-Propyl Glycol (SYSTANE FREE OP) Place 1 drop into  both eyes every 4 (four) hours as needed (Dry eyes).   rOPINIRole (REQUIP) 3 MG tablet Take 1 tablet (3 mg total) by mouth at bedtime.   rosuvastatin (CRESTOR) 20 MG tablet Take 1 tablet (20 mg total) by mouth daily.   Semaglutide, 2 MG/DOSE, (OZEMPIC, 2 MG/DOSE,) 8 MG/3ML SOPN Inject 2 mg into the skin once a week.   thyroid (NP THYROID) 90 MG tablet Take 1 tablet (90 mg total) by mouth daily.   vitamin B-12 (CYANOCOBALAMIN) 1000 MCG tablet Take 1,000 mcg by mouth daily.   No facility-administered encounter medications on file as of  03/31/2022.    Allergies (verified) Sulfa antibiotics, Ace inhibitors, Invokana [canagliflozin], Vytorin [ezetimibe-simvastatin], Atorvastatin, and Other   History: Past Medical History:  Diagnosis Date   Anemia    Anxiety    Arthritis    Cancer ([place])    basal cell carcinoma right arm and nose spots removed   Depression    GERD (gastroesophageal reflux disease)    HLD (hyperlipidemia) 02/20/2019   Morbid obesity ([place]) 02/20/2019   Primary hypothyroidism 02/20/2019   RLS (restless legs syndrome)    Shingles    Sleep apnea    Type II diabetes mellitus, uncontrolled 02/20/2019   diagnosed 2010   Vitamin D deficiency disease 02/20/2019   Past Surgical History:  Procedure Laterality Date   ANTERIOR LAT LUMBAR FUSION N/A 10/15/2020   Procedure: ANTERIOR LATERAL LUMBAR FUSION 2 LEVELS (XLIF) L2-4;  Surgeon: Melina Schools, MD;  Location: [facility];  Service: Orthopedics;  Laterality: N/A;  4 hrs   BACK SURGERY  2012, 2016   lumbar fusion 2012, cleaning out of area 2016   BREAST BIOPSY Left 2010   CHOLECYSTECTOMY  2011   CLOSED REDUCTION ANKLE FRACTURE     CLOSED REDUCTION HUMERUS FRACTURE Right 2014   COLONOSCOPY WITH PROPOFOL N/A 02/21/2018   Procedure: COLONOSCOPY WITH PROPOFOL;  Surgeon: Danie Binder, MD;  [facility];  Service: Endoscopy;  Laterality: N/A;  1:15pm - pt knows to arrive at 10:30   EYE SURGERY  2016   eye lid droop    KNEE ARTHROSCOPY Right 2010   LUMBAR LAMINECTOMY/DECOMPRESSION MICRODISCECTOMY N/A 10/16/2020   Procedure: Revision decompression L2-3, L3-4;  Surgeon: Melina Schools, MD;  Location: [facility];  Service: Orthopedics;  Laterality: N/A;   POLYPECTOMY  02/21/2018   Procedure: POLYPECTOMY;  Surgeon: Danie Binder, MD;  [facility];  Service: Endoscopy;;  hepatic flexure polyp   SKIN CANCER EXCISION     basal cell cancer, arm/face   TOTAL KNEE ARTHROPLASTY Right 01/31/2017   Procedure: RIGHT TOTAL KNEE ARTHROPLASTY;  Surgeon:  Paralee Cancel, MD;  Location[facility];  Service: Orthopedics;  Laterality: Right;  90 mins   Family History  Problem Relation Age of Onset   Cancer Mother        breast   Heart disease Father    Stroke Father    Cancer Father        liver cancer had resection, had "hepatitis"   Heart disease Brother    Hyperlipidemia Brother    Hyperlipidemia Brother    Colon cancer Neg Hx    Social History   Socioeconomic History   Marital status: Married    Spouse name: Not on file   Number of children: Not on file   Years of education: Not on file   Highest education level: Not on file  Occupational History   Not on file  Tobacco Use  Smoking status: Former    Packs/day: 1.00    Years: 8.00    Total pack years: 8.00    Types: Cigarettes    Start date: 06/14/1970    Quit date: 06/14/1978    Years since quitting: 43.8   Smokeless tobacco: Never   Tobacco comments:    quit age 74  Vaping Use   Vaping Use: Never used  Substance and Sexual Activity   Alcohol use: Not Currently    Alcohol/week: 7.0 standard drinks of alcohol    Types: 7 Standard drinks or equivalent per week    Comment: 1 mixed drinks/day   Drug use: No   Sexual activity: Not Currently  Other Topics Concern   Not on file  Social History Narrative   Married for 42 years.Moved from [place] in December as husband found new job.Used to be a librarian,retired.Husband a Health visitor from [place] retired.   Social Determinants of Health   Financial Resource Strain: Low Risk  (03/31/2022)   Overall Financial Resource Strain (CARDIA)    Difficulty of Paying Living Expenses: Not hard at all  Food Insecurity: No Food Insecurity (03/31/2022)   Hunger Vital Sign    Worried About Running Out of Food in the Last Year: Never true    Ran Out of Food in the Last Year: Never true  Transportation Needs: No Transportation Needs (03/31/2022)   PRAPARE - Hydrologist (Medical): No    Lack of  Transportation (Non-Medical): No  Physical Activity: Sufficiently Active (03/31/2022)   Exercise Vital Sign    Days of Exercise per Week: 5 days    Minutes of Exercise per Session: 30 min  Stress: No Stress Concern Present (03/31/2022)   [place]    Feeling of Stress : Not at all  Social Connections: Moderately Isolated (03/31/2022)   Social Connection and Isolation Panel [NHANES]    Frequency of Communication with Friends and Family: Twice a week    Frequency of Social Gatherings with Friends and Family: Once a week    Attends Religious Services: Never    Marine scientist or Organizations: No    Attends Music therapist: Never    Marital Status: Married    Tobacco Counseling Counseling given: Not Answered Tobacco comments: quit age 48   Clinical Intake:  Pre-visit preparation completed: Yes  Pain : No/denies pain     BMI - recorded: 34.02 (01/21/2022) Nutritional Status: BMI > 30  Obese Nutritional Risks: None Diabetes: No  How often do you need to have someone help you when you read instructions, pamphlets, or other written materials from your doctor or pharmacy?: 1 - Never What is the last grade level you completed in school?: HSG; Bachelor's Degree  Nutrition Risk Assessment:  Has the patient had any N/V/D within the last 2 months?  No  Does the patient have any non-healing wounds?  No  Has the patient had any unintentional weight loss or weight gain?  No   Diabetes:  Is the patient diabetic?  Yes  If diabetic, was a CBG obtained today?  No  Did the patient bring in their glucometer from home?  No  How often do you monitor your CBG's? Dexcom G6.   Financial Strains and Diabetes Management:  Are you having any financial strains with the device, your supplies or your medication? No .  Does the patient want to be seen by Chronic Care Management  for management of their  diabetes?  No  Would the patient like to be referred to a Nutritionist or for Diabetic Management?  No   Diabetic Exams:  Diabetic Eye Exam: Completed 08/24/2021 Diabetic Foot Exam: Completed 08/20/2021   Interpreter Needed?: No  Information entered by :: Lisette Abu, LPN.   Activities of Daily Living    03/31/2022   11:16 AM  In your present state of health, do you have any difficulty performing the following activities:  Hearing? 0  Vision? 0  Difficulty concentrating or making decisions? 0  Walking or climbing stairs? 0  Dressing or bathing? 0  Doing errands, shopping? 0  Preparing Food and eating ? N  Using the Toilet? N  In the past six months, have you accidently leaked urine? N  Do you have problems with loss of bowel control? N  Managing your Medications? N  Managing your Finances? N  Housekeeping or managing your Housekeeping? N    Patient Care Team: Ailene Ards, NP as PCP - General (Nurse Practitioner) Carolynn Serve, RN (Inactive) as Registered Nurse Eloise Harman, DO as Consulting Physician (Gastroenterology) Lavonna Monarch, MD (Inactive) as Consulting Physician (Dermatology)  Indicate any recent Medical Services you may have received from other than [facility] providers in the past year (date may be approximate).     Assessment:   This is a routine wellness examination for Megan Church.  Hearing/Vision screen Hearing Screening - Comments:: Denies hearing difficulties.   Vision Screening - Comments:: Wears readers - up to date with routine eye exams.   Dietary issues and exercise activities discussed: Current Exercise Habits: Home exercise routine, Type of exercise: walking, Time (Minutes): 30, Frequency (Times/Week): 5, Weekly Exercise (Minutes/Week): 150, Intensity: Moderate, Exercise limited by: None identified   Goals Addressed             This Visit's Progress    Maintain my health.        Depression Screen    03/31/2022   11:16 AM  01/21/2022    2:46 PM 09/03/2021   10:50 AM 04/03/2021   10:41 AM 11/26/2020   10:12 AM 05/26/2020   10:06 AM  PHQ 2/9 Scores  PHQ - 2 Score 0  0 0 0 0  PHQ- 9 Score    1 0 0  Exception Documentation  Patient refusal        Fall Risk    03/31/2022   11:09 AM 01/21/2022    2:46 PM 09/03/2021   10:50 AM 04/03/2021   10:41 AM 11/26/2020   10:12 AM  Fall Risk   Falls in the past year? 0 0 1 0 1  Number falls in past yr: 0 0 0 0 1  Injury with Fall? 0 0 1 0 1  Risk for fall due to : No Fall Risks No Fall Risks  No Fall Risks   Follow up Falls prevention discussed Falls evaluation completed  Falls evaluation completed     FALL RISK PREVENTION PERTAINING TO THE HOME:  Any stairs in or around the home? No  If so, are there any without handrails? No  Home free of loose throw rugs in walkways, pet beds, electrical cords, etc? Yes  Adequate lighting in your home to reduce risk of falls? Yes   ASSISTIVE DEVICES UTILIZED TO PREVENT FALLS:  Life alert? No  Use of a cane, walker or w/c? No  Grab bars in the bathroom? No  Shower chair or bench in shower? No  Elevated toilet seat or a handicapped toilet? No   TIMED UP AND GO:  Was the test performed? No . Phone Visit   Cognitive Function:        03/31/2022   11:13 AM  6CIT Screen  What Year? 0 points  What month? 0 points  What time? 0 points  Count back from 20 0 points  Months in reverse 0 points  Repeat phrase 0 points  Total Score 0 points    Immunizations Immunization History  Administered Date(s) Administered   Hepatitis B 04/03/2021   Influenza Split 04/22/2014, 02/20/2015   Influenza,inj,Quad PF,6+ Mos 01/31/2019, 01/31/2019   Influenza,inj,quad, With Preservative 04/10/2018   Influenza-Unspecified 03/21/2020, 02/24/2021, 03/05/2022, 03/05/2022   MODERNA COVID-19 SARS-COV-2 PEDS BIVALENT BOOSTER 6Y-11Y 03/08/2022   Moderna SARS-COV2 Booster Vaccination 09/15/2020, 02/24/2021   PFIZER(Purple Top)SARS-COV-2  Vaccination 08/30/2019, 09/24/2019, 04/05/2020   PNEUMOCOCCAL CONJUGATE-20 03/10/2021   Pneumococcal Conjugate-13 02/28/2018   Pneumococcal Polysaccharide-23 04/03/2019   Tdap 09/03/2021   Zoster Recombinat (Shingrix) 03/10/2021, 05/18/2021    TDAP status: Up to date  Flu Vaccine status: Up to date  Pneumococcal vaccine status: Up to date  Covid-19 vaccine status: Completed vaccines  Qualifies for Shingles Vaccine? Yes   Zostavax completed No   Shingrix Completed?: Yes  Screening Tests Health Maintenance  Topic Date Due   Diabetic kidney evaluation - Urine ACR  11/26/2021   COVID-19 Vaccine (5 - Pfizer risk series) 05/03/2022   HEMOGLOBIN A1C  05/22/2022   FOOT EXAM  08/21/2022   OPHTHALMOLOGY EXAM  08/25/2022   Diabetic kidney evaluation - GFR measurement  01/22/2023   MAMMOGRAM  01/05/2024   COLONOSCOPY (Pts 45-49yr Insurance coverage will need to be confirmed)  02/22/2028   TETANUS/TDAP  09/04/2031   Pneumonia Vaccine 67 Years old  Completed   INFLUENZA VACCINE  Completed   DEXA SCAN  Completed   Hepatitis C Screening  Completed   Zoster Vaccines- Shingrix  Completed   HPV VACCINES  Aged Out    Health Maintenance  Health Maintenance Due  Topic Date Due   Diabetic kidney evaluation - Urine ACR  11/26/2021    Colorectal cancer screening: Type of screening: Colonoscopy. Completed 02/21/2018. Repeat every 10 years  Mammogram status: Completed 01/04/2022. Repeat every year  Bone Density status: Completed 01/26/2022. Results reflect: Bone density results: OSTEOPENIA. Repeat every 2 years.  Lung Cancer Screening: (Low Dose CT Chest recommended if Age 67-80years, 30 pack-year currently smoking OR have quit w/in 15years.) does not qualify.   Lung Cancer Screening Referral: no  Additional Screening:  Hepatitis C Screening: does not qualify; Completed 03/24/2021  Vision Screening: Recommended annual ophthalmology exams for early detection of glaucoma and other  disorders of the eye. Is the patient up to date with their annual eye exam?  Yes  Who is the provider or what is the name of the office in which the patient attends annual eye exams? [facility] If pt is not established with a provider, would they like to be referred to a provider to establish care? No .   Dental Screening: Recommended annual dental exams for proper oral hygiene  Community Resource Referral / Chronic Care Management: CRR required this visit?  No   CCM required this visit?  No      Plan:     I have personally reviewed and noted the following in the patient's chart:   Medical and social history Use of alcohol, tobacco or illicit drugs  Current medications and supplements including  opioid prescriptions. Patient is not currently taking opioid prescriptions. Functional ability and status Nutritional status Physical activity Advanced directives List of other physicians Hospitalizations, surgeries, and ER visits in previous 12 months Vitals Screenings to include cognitive, depression, and falls Referrals and appointments  In addition, I have reviewed and discussed with patient certain preventive protocols, quality metrics, and best practice recommendations. A written personalized care plan for preventive services as well as general preventive health recommendations were provided to patient.     Sheral Flow, LPN   [id number]   Nurse Notes: N/A    Medical screening examination/treatment/procedure(s) were performed by non-physician practitioner and as supervising physician I was immediately available for consultation/collaboration.  I agree with above. Lew Dawes, MD

## 2022-04-21 ENCOUNTER — Other Ambulatory Visit: Payer: Self-pay | Admitting: Internal Medicine

## 2022-04-21 DIAGNOSIS — E119 Type 2 diabetes mellitus without complications: Secondary | ICD-10-CM

## 2022-05-11 ENCOUNTER — Other Ambulatory Visit: Payer: Self-pay | Admitting: Internal Medicine

## 2022-05-11 DIAGNOSIS — E1165 Type 2 diabetes mellitus with hyperglycemia: Secondary | ICD-10-CM

## 2022-05-11 DIAGNOSIS — E119 Type 2 diabetes mellitus without complications: Secondary | ICD-10-CM

## 2022-05-19 ENCOUNTER — Telehealth: Payer: Self-pay

## 2022-05-19 NOTE — Telephone Encounter (Signed)
DME request for clinical notes. Notes routed to [facility] via [place] to [phone].

## 2022-06-21 ENCOUNTER — Telehealth: Payer: Medicare Other | Admitting: Emergency Medicine

## 2022-06-21 DIAGNOSIS — R0981 Nasal congestion: Secondary | ICD-10-CM

## 2022-06-21 NOTE — Progress Notes (Signed)
Because you are not in [place] or [place], I am not licensed to help you by Evisit when you are out of state. You can choose either a local urgent care in [place] or you can try a video visit with [facility]. [facility] has healthcare providers licensed in every state.   [email or web address]  Kind regards,  Willeen Cass, PhD, FNP-BC [place]

## 2022-09-09 ENCOUNTER — Telehealth: Payer: Self-pay

## 2022-09-26 NOTE — Telephone Encounter (Signed)
error 

## 2022-10-21 ENCOUNTER — Encounter: Payer: Self-pay | Admitting: Internal Medicine

## 2023-01-24 ENCOUNTER — Encounter: Payer: Self-pay | Admitting: *Deleted

## 2023-02-16 ENCOUNTER — Telehealth: Payer: Self-pay | Admitting: Nurse Practitioner

## 2023-02-16 NOTE — Telephone Encounter (Signed)
Contacted Megan Church to schedule their annual wellness visit. Patient declined to schedule AWV at this time. Patient has moved to [place].   [facility] Care Guide [facility] AWV TEAM Direct Dial: [phone]
